# Patient Record
Sex: Female | Born: 2000
Health system: Southern US, Community
[De-identification: ages and names within clinical notes are randomized; demographics above are authoritative.]

## PROBLEM LIST (undated history)

## (undated) ENCOUNTER — Inpatient Hospital Stay (HOSPITAL_COMMUNITY): Payer: Self-pay

## (undated) VITALS — BP 90/50 | HR 103 | Temp 98.5°F | Resp 16 | Ht 61.02 in | Wt 114.6 lb

## (undated) DIAGNOSIS — F329 Major depressive disorder, single episode, unspecified: Secondary | ICD-10-CM

## (undated) DIAGNOSIS — R519 Headache, unspecified: Secondary | ICD-10-CM

## (undated) DIAGNOSIS — A6000 Herpesviral infection of urogenital system, unspecified: Secondary | ICD-10-CM

## (undated) DIAGNOSIS — F4310 Post-traumatic stress disorder, unspecified: Secondary | ICD-10-CM

## (undated) DIAGNOSIS — T7422XA Child sexual abuse, confirmed, initial encounter: Secondary | ICD-10-CM

## (undated) DIAGNOSIS — O24419 Gestational diabetes mellitus in pregnancy, unspecified control: Secondary | ICD-10-CM

## (undated) DIAGNOSIS — F32A Depression, unspecified: Secondary | ICD-10-CM

## (undated) DIAGNOSIS — F419 Anxiety disorder, unspecified: Secondary | ICD-10-CM

## (undated) DIAGNOSIS — R011 Cardiac murmur, unspecified: Secondary | ICD-10-CM

## (undated) HISTORY — DX: Cardiac murmur, unspecified: R01.1

## (undated) HISTORY — PX: NO PAST SURGERIES: SHX2092

---
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
## 2020-08-14 ENCOUNTER — Other Ambulatory Visit: Payer: Self-pay

## 2020-08-14 ENCOUNTER — Encounter (HOSPITAL_COMMUNITY): Payer: Self-pay

## 2020-08-14 ENCOUNTER — Emergency Department (HOSPITAL_COMMUNITY): Payer: Medicaid Other

## 2020-08-14 ENCOUNTER — Emergency Department (HOSPITAL_COMMUNITY)
Admission: EM | Admit: 2020-08-14 | Discharge: 2020-08-14 | Disposition: A | Discharge status: Home / Self Care | Payer: Medicaid Other | Attending: Emergency Medicine | Admitting: Emergency Medicine

## 2020-08-14 DIAGNOSIS — B9689 Other specified bacterial agents as the cause of diseases classified elsewhere: Secondary | ICD-10-CM | POA: Insufficient documentation

## 2020-08-14 DIAGNOSIS — N390 Urinary tract infection, site not specified: Secondary | ICD-10-CM | POA: Insufficient documentation

## 2020-08-14 DIAGNOSIS — R300 Dysuria: Secondary | ICD-10-CM | POA: Diagnosis present

## 2020-08-14 DIAGNOSIS — R102 Pelvic and perineal pain: Secondary | ICD-10-CM | POA: Diagnosis not present

## 2020-08-14 HISTORY — DX: Anxiety disorder, unspecified: F41.9

## 2020-08-14 LAB — POC URINE PREG, ED: Preg Test, Ur: NEGATIVE

## 2020-08-14 LAB — URINALYSIS, ROUTINE W REFLEX MICROSCOPIC
Bilirubin Urine: NEGATIVE
Glucose, UA: NEGATIVE mg/dL
Hgb urine dipstick: NEGATIVE
Ketones, ur: NEGATIVE mg/dL
Nitrite: NEGATIVE
Protein, ur: 30 mg/dL — AB
Specific Gravity, Urine: 1.033 — ABNORMAL HIGH (ref 1.005–1.030)
WBC, UA: >50 WBC/hpf — ABNORMAL HIGH (ref 0–5)
pH: 6 (ref 5.0–8.0)

## 2020-08-14 LAB — HIV ANTIBODY (ROUTINE TESTING W REFLEX): HIV Screen 4th Generation wRfx: NONREACTIVE

## 2020-08-14 LAB — WET PREP, GENITAL
Clue Cells Wet Prep HPF POC: NONE SEEN
Sperm: NONE SEEN
Trich, Wet Prep: NONE SEEN
Yeast Wet Prep HPF POC: NONE SEEN

## 2020-08-14 MED ORDER — CEPHALEXIN 500 MG PO CAPS: 500.0000 mg | ORAL_CAPSULE | Freq: Two times a day (BID) | ORAL | 0 refills | Status: DC

## 2020-08-14 MED ORDER — LIDOCAINE HCL (PF) 1 % IJ SOLN
1.0000 mL | Freq: Once | INTRAMUSCULAR | Status: AC
  Administered 2020-08-14: 1 mL
  Filled 2020-08-14: qty 30

## 2020-08-14 MED ORDER — DOXYCYCLINE HYCLATE 100 MG PO CAPS: 100.0000 mg | ORAL_CAPSULE | Freq: Two times a day (BID) | ORAL | 0 refills | Status: AC

## 2020-08-14 MED ORDER — CEFTRIAXONE SODIUM 1 G IJ SOLR
500.0000 mg | Freq: Once | INTRAMUSCULAR | Status: AC
  Administered 2020-08-14: 500 mg via INTRAMUSCULAR
  Filled 2020-08-14: qty 10

## 2020-08-14 MED ORDER — DOXYCYCLINE HYCLATE 100 MG PO CAPS: 100.0000 mg | ORAL_CAPSULE | Freq: Two times a day (BID) | ORAL | 0 refills | Status: DC

## 2020-08-14 MED ORDER — CEPHALEXIN 500 MG PO CAPS: 500.0000 mg | ORAL_CAPSULE | Freq: Two times a day (BID) | ORAL | 0 refills | Status: AC

## 2020-08-14 NOTE — ED Triage Notes (Signed)
Patient c/o right flank pain and dysuria x 1 week. Patient also thinks she might be pregnant.
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
## 2020-08-14 NOTE — Discharge Instructions (Signed)
Seen here for pelvic pain.  Lab work shows you have a UTI have started you on antibiotics please take as prescribed.  Your imaging shows you have possible ruptured ovarian cyst.  I recommend taking over-the-counter pain medications like ibuprofen or Tylenol every 6 as needed please follow dosing the back of bottle.  I also recommend staying hydrated as this will help with your UTI symptoms.  Your STD panel is pending at this time if anything is positive the hospital contact you you can also find this information on your MyChart account.  I advise abstain from sexual intercourse for 2 weeks and safe sex practices to prevent future STD scares.  I would like you to follow-up with OB/GYN for further evaluation management.  Come back to the emergency department if you develop chest pain, shortness of breath, severe abdominal pain, uncontrolled nausea, vomiting, diarrhea.

## 2020-08-15 LAB — URINE CULTURE: Culture: NO GROWTH

## 2020-08-15 LAB — GC/CHLAMYDIA PROBE AMP (~~LOC~~) NOT AT ARMC
Chlamydia: NEGATIVE
Comment: NEGATIVE
Comment: NORMAL
Neisseria Gonorrhea: POSITIVE — AB

## 2020-08-15 LAB — RPR: RPR Ser Ql: NONREACTIVE

## 2020-08-31 ENCOUNTER — Other Ambulatory Visit: Payer: Self-pay

## 2020-08-31 ENCOUNTER — Ambulatory Visit (HOSPITAL_COMMUNITY)
Admission: EM | Admit: 2020-08-31 | Discharge: 2020-08-31 | Disposition: A | Discharge status: Home / Self Care | Payer: Medicaid Other
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
## 2020-09-01 ENCOUNTER — Emergency Department (HOSPITAL_COMMUNITY)
Admission: EM | Admit: 2020-09-01 | Discharge: 2020-09-02 | Disposition: A | Discharge status: Rehabilitation Facility | Payer: Medicaid Other | Attending: Emergency Medicine | Admitting: Emergency Medicine

## 2020-09-01 ENCOUNTER — Encounter (HOSPITAL_COMMUNITY): Payer: Self-pay | Admitting: Emergency Medicine

## 2020-09-01 DIAGNOSIS — Z20822 Contact with and (suspected) exposure to covid-19: Secondary | ICD-10-CM | POA: Insufficient documentation

## 2020-09-01 DIAGNOSIS — R718 Other abnormality of red blood cells: Secondary | ICD-10-CM

## 2020-09-01 DIAGNOSIS — R45851 Suicidal ideations: Secondary | ICD-10-CM | POA: Insufficient documentation

## 2020-09-01 DIAGNOSIS — F315 Bipolar disorder, current episode depressed, severe, with psychotic features: Secondary | ICD-10-CM | POA: Insufficient documentation

## 2020-09-01 LAB — RESP PANEL BY RT-PCR (FLU A&B, COVID) ARPGX2
Influenza A by PCR: NEGATIVE
Influenza B by PCR: NEGATIVE
SARS Coronavirus 2 by RT PCR: NEGATIVE

## 2020-09-01 LAB — CBC WITH DIFFERENTIAL/PLATELET
Abs Immature Granulocytes: 0.02 10*3/uL (ref 0.00–0.07)
Basophils Absolute: 0 10*3/uL (ref 0.0–0.1)
Basophils Relative: 0 %
Eosinophils Absolute: 0.1 10*3/uL (ref 0.0–0.5)
Eosinophils Relative: 2 %
HCT: 39.2 % (ref 36.0–46.0)
Hemoglobin: 12.2 g/dL (ref 12.0–15.0)
Immature Granulocytes: 0 %
Lymphocytes Relative: 33 %
Lymphs Abs: 2.6 10*3/uL (ref 0.7–4.0)
MCH: 24 pg — ABNORMAL LOW (ref 26.0–34.0)
MCHC: 31.1 g/dL (ref 30.0–36.0)
MCV: 77.2 fL — ABNORMAL LOW (ref 80.0–100.0)
Monocytes Absolute: 0.5 10*3/uL (ref 0.1–1.0)
Monocytes Relative: 6 %
Neutro Abs: 4.5 10*3/uL (ref 1.7–7.7)
Neutrophils Relative %: 59 %
Platelets: 311 10*3/uL (ref 150–400)
RBC: 5.08 MIL/uL (ref 3.87–5.11)
RDW: 14.1 % (ref 11.5–15.5)
WBC: 7.7 10*3/uL (ref 4.0–10.5)
nRBC: 0 % (ref 0.0–0.2)

## 2020-09-01 LAB — COMPREHENSIVE METABOLIC PANEL
ALT: 26 U/L (ref 0–44)
AST: 22 U/L (ref 15–41)
Albumin: 4.3 g/dL (ref 3.5–5.0)
Alkaline Phosphatase: 54 U/L (ref 38–126)
Anion gap: 10 (ref 5–15)
BUN: 14 mg/dL (ref 6–20)
CO2: 25 mmol/L (ref 22–32)
Calcium: 9.3 mg/dL (ref 8.9–10.3)
Chloride: 102 mmol/L (ref 98–111)
Creatinine, Ser: 0.76 mg/dL (ref 0.44–1.00)
GFR, Estimated: >60 mL/min (ref >60)
Glucose, Bld: 136 mg/dL — ABNORMAL HIGH (ref 70–99)
Potassium: 3.8 mmol/L (ref 3.5–5.1)
Sodium: 137 mmol/L (ref 135–145)
Total Bilirubin: 0.5 mg/dL (ref 0.3–1.2)
Total Protein: 7 g/dL (ref 6.5–8.1)

## 2020-09-01 LAB — RAPID URINE DRUG SCREEN, HOSP PERFORMED
Amphetamines: NOT DETECTED
Barbiturates: NOT DETECTED
Benzodiazepines: NOT DETECTED
Cocaine: NOT DETECTED
Opiates: NOT DETECTED
Tetrahydrocannabinol: NOT DETECTED

## 2020-09-01 LAB — ACETAMINOPHEN LEVEL: Acetaminophen (Tylenol), Serum: <10 ug/mL — ABNORMAL LOW (ref 10–30)

## 2020-09-01 LAB — ETHANOL: Alcohol, Ethyl (B): <10 mg/dL (ref <10)

## 2020-09-01 LAB — I-STAT BETA HCG BLOOD, ED (MC, WL, AP ONLY): I-stat hCG, quantitative: <5 m[IU]/mL (ref <5)

## 2020-09-01 LAB — SALICYLATE LEVEL: Salicylate Lvl: <7 mg/dL — ABNORMAL LOW (ref 7.0–30.0)
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
## 2020-09-01 NOTE — ED Triage Notes (Signed)
Pt BIB [facility]. Pt endorsing SI, stating she wants to murder herself. Hx of anxiety, MDD, and PTSD. Non compliant with medications. Reports pt was running in and out of traffic.

## 2020-09-02 ENCOUNTER — Other Ambulatory Visit: Payer: Self-pay

## 2020-09-02 ENCOUNTER — Encounter (HOSPITAL_COMMUNITY): Payer: Self-pay

## 2020-09-02 ENCOUNTER — Inpatient Hospital Stay (HOSPITAL_COMMUNITY)
Admission: AD | Admit: 2020-09-02 | Discharge: 2020-09-05 | DRG: 885 | Disposition: A | Discharge status: Home / Self Care | Payer: Medicaid Other | Attending: Psychiatry | Admitting: Psychiatry

## 2020-09-02 DIAGNOSIS — F315 Bipolar disorder, current episode depressed, severe, with psychotic features: Secondary | ICD-10-CM | POA: Diagnosis not present

## 2020-09-02 DIAGNOSIS — Z20822 Contact with and (suspected) exposure to covid-19: Secondary | ICD-10-CM | POA: Diagnosis present

## 2020-09-02 DIAGNOSIS — R45851 Suicidal ideations: Secondary | ICD-10-CM | POA: Diagnosis present

## 2020-09-02 DIAGNOSIS — F319 Bipolar disorder, unspecified: Principal | ICD-10-CM | POA: Insufficient documentation

## 2020-09-02 DIAGNOSIS — Z9151 Personal history of suicidal behavior: Secondary | ICD-10-CM

## 2020-09-02 DIAGNOSIS — Z888 Allergy status to other drugs, medicaments and biological substances status: Secondary | ICD-10-CM

## 2020-09-02 DIAGNOSIS — Z634 Disappearance and death of family member: Secondary | ICD-10-CM | POA: Diagnosis not present

## 2020-09-02 DIAGNOSIS — F329 Major depressive disorder, single episode, unspecified: Secondary | ICD-10-CM | POA: Diagnosis present

## 2020-09-02 DIAGNOSIS — Z62810 Personal history of physical and sexual abuse in childhood: Secondary | ICD-10-CM | POA: Diagnosis present

## 2020-09-02 DIAGNOSIS — Z79899 Other long term (current) drug therapy: Secondary | ICD-10-CM

## 2020-09-02 DIAGNOSIS — Z818 Family history of other mental and behavioral disorders: Secondary | ICD-10-CM

## 2020-09-02 DIAGNOSIS — F4310 Post-traumatic stress disorder, unspecified: Secondary | ICD-10-CM | POA: Diagnosis present

## 2020-09-02 DIAGNOSIS — Z79891 Long term (current) use of opiate analgesic: Secondary | ICD-10-CM | POA: Diagnosis not present

## 2020-09-02 DIAGNOSIS — R630 Anorexia: Secondary | ICD-10-CM | POA: Diagnosis present

## 2020-09-02 DIAGNOSIS — F419 Anxiety disorder, unspecified: Secondary | ICD-10-CM | POA: Diagnosis present

## 2020-09-02 LAB — URINALYSIS, ROUTINE W REFLEX MICROSCOPIC
Bilirubin Urine: NEGATIVE
Glucose, UA: NEGATIVE mg/dL
Hgb urine dipstick: NEGATIVE
Ketones, ur: NEGATIVE mg/dL
Leukocytes,Ua: NEGATIVE
Nitrite: NEGATIVE
Protein, ur: NEGATIVE mg/dL
Specific Gravity, Urine: 1.027 (ref 1.005–1.030)
pH: 7 (ref 5.0–8.0)

## 2020-09-02 MED ORDER — HYDROXYZINE HCL 25 MG PO TABS
25.0000 mg | ORAL_TABLET | Freq: Three times a day (TID) | ORAL | Status: DC | PRN
  Administered 2020-09-03 – 2020-09-05 (×2): 25 mg via ORAL
  Filled 2020-09-02 (×2): qty 1

## 2020-09-02 MED ORDER — ALUM & MAG HYDROXIDE-SIMETH 200-200-20 MG/5ML PO SUSP: 30.0000 mL | ORAL | Status: DC | PRN

## 2020-09-02 MED ORDER — MAGNESIUM HYDROXIDE 400 MG/5ML PO SUSP
30.0000 mL | Freq: Every day | ORAL | Status: DC | PRN
  Administered 2020-09-04: 30 mL via ORAL
  Filled 2020-09-02: qty 30

## 2020-09-02 MED ORDER — SODIUM CHLORIDE 0.9 % IV BOLUS
1000.0000 mL | Freq: Once | INTRAVENOUS | Status: AC
  Administered 2020-09-02: 1000 mL via INTRAVENOUS

## 2020-09-02 MED ORDER — TRAZODONE HCL 50 MG PO TABS: 50.0000 mg | ORAL_TABLET | Freq: Every evening | ORAL | Status: DC | PRN

## 2020-09-02 MED ORDER — ACETAMINOPHEN 325 MG PO TABS
650.0000 mg | ORAL_TABLET | Freq: Four times a day (QID) | ORAL | Status: DC | PRN
  Administered 2020-09-03 – 2020-09-05 (×2): 650 mg via ORAL
  Filled 2020-09-02 (×2): qty 2

## 2020-09-02 MED ORDER — ARIPIPRAZOLE 10 MG PO TABS
20.0000 mg | ORAL_TABLET | Freq: Every day | ORAL | Status: DC
  Administered 2020-09-03: 20 mg via ORAL
  Filled 2020-09-02 (×4): qty 2

## 2020-09-02 NOTE — Progress Notes (Signed)
Per Ophelia Shoulder NP, pt continues to meet inpatient criteria. Pt is in review at [facility], however there are no beds available today.  TTS continuing to assess for placement.   Korra Christine S. Alan Ripper, MSW, LCSW Clinical Social Worker 09/02/2020 2:15 PM

## 2020-09-02 NOTE — ED Notes (Signed)
Report c 
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
## 2020-09-02 NOTE — Progress Notes (Signed)
Patient is a 19 year old female who presents under IVC due to 'running in and out of traffic'.Pt was petitioned by her mother who reported that pt was suicidal. Pt denies SI, but reports that she has been 'unstable' since running out of her meds. Pt reports that she hasn't been eating/ drinking, endorses crying spells and PTSD . Pt presents as anxious and somewhat hyper-verbal, answered questions logically and coherently throughout admission interview. Pt currently denies SI/HI and A/VH. Admission paperwork completed and signed. Verbal understanding expressed. VS obtained.  Skin check performed with MHTand revealed no abnormalities. Contraband was not found. Patient was oriented to unit and schedule. Dinner and PO fluids provided.

## 2020-09-02 NOTE — ED Notes (Signed)
[facility] notified for transport.

## 2020-09-02 NOTE — BH Assessment (Signed)
Attempted to assess patient, but she is currently sleeping.

## 2020-09-02 NOTE — ED Notes (Signed)
Pt.s blood pressure low. RN notified.

## 2020-09-02 NOTE — ED Notes (Signed)
Pt walked to the bathroom. Pt cooperative and calm during the entire time. Pt sitting up on stretcher and eating breakfast.
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
## 2020-09-02 NOTE — ED Notes (Signed)
Pt awoken from nap, asked to use the restroom. Pt walked, cooperative throughout the entire time. This Clinical research associate asked pt if she wanted a snack, pt stated yes. [place] sandwich given topt.

## 2020-09-02 NOTE — Progress Notes (Signed)
Per Armen Pickup. Hart Rochester RN, [facility], pt has been accepted to [facility] 404/01 for today. Ophelia Shoulder NP has been notified.   Tyger Oka S. Alan Ripper, MSW, LCSW Clinical Social Worker 09/02/2020 3:56 PM

## 2020-09-02 NOTE — ED Notes (Signed)
Pt belongings moved back to the cabinet in the 23-25 nurses station.

## 2020-09-02 NOTE — ED Notes (Signed)
Report called to [facility] RN Alla Feeling

## 2020-09-03 DIAGNOSIS — Z634 Disappearance and death of family member: Secondary | ICD-10-CM

## 2020-09-03 DIAGNOSIS — F4310 Post-traumatic stress disorder, unspecified: Secondary | ICD-10-CM

## 2020-09-03 LAB — LIPID PANEL
Cholesterol: 156 mg/dL (ref 0–200)
HDL: 53 mg/dL (ref >40)
LDL Cholesterol: 73 mg/dL (ref 0–99)
Total CHOL/HDL Ratio: 2.9 RATIO
Triglycerides: 150 mg/dL — ABNORMAL HIGH (ref <150)
VLDL: 30 mg/dL (ref 0–40)

## 2020-09-03 LAB — HEMOGLOBIN A1C
Hgb A1c MFr Bld: 5.4 % (ref 4.8–5.6)
Mean Plasma Glucose: 108.28 mg/dL

## 2020-09-03 MED ORDER — FOLIC ACID 1 MG PO TABS
1.0000 mg | ORAL_TABLET | Freq: Every day | ORAL | Status: DC
  Administered 2020-09-03 – 2020-09-05 (×3): 1 mg via ORAL
  Filled 2020-09-03 (×5): qty 1

## 2020-09-03 MED ORDER — ONDANSETRON HCL 4 MG PO TABS
4.0000 mg | ORAL_TABLET | Freq: Four times a day (QID) | ORAL | Status: DC
  Administered 2020-09-03 – 2020-09-05 (×7): 4 mg via ORAL
  Filled 2020-09-03 (×16): qty 1

## 2020-09-03 MED ORDER — ARIPIPRAZOLE 2 MG PO TABS
2.0000 mg | ORAL_TABLET | Freq: Every day | ORAL | Status: DC
  Administered 2020-09-04: 2 mg via ORAL
  Filled 2020-09-03 (×2): qty 1

## 2020-09-03 MED ORDER — TRAZODONE HCL 50 MG PO TABS
50.0000 mg | ORAL_TABLET | Freq: Every day | ORAL | Status: DC
  Administered 2020-09-03: 50 mg via ORAL
  Filled 2020-09-03 (×5): qty 1

## 2020-09-03 MED ORDER — THIAMINE HCL 100 MG PO TABS
100.0000 mg | ORAL_TABLET | Freq: Every day | ORAL | Status: DC
  Administered 2020-09-03 – 2020-09-05 (×3): 100 mg via ORAL
  Filled 2020-09-03 (×5): qty 1
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
## 2020-09-03 NOTE — Progress Notes (Signed)
Patient stated she has never smoked tobacco, chewed or vaped tobacco.   No nicotine patch or nicotine gum needed.

## 2020-09-03 NOTE — Progress Notes (Signed)
Pt has been a sleep since the beginning of the shift, no interaction with the writer, respiration has been even and unlabored, will continue to monitor.

## 2020-09-03 NOTE — [facility] Group Notes (Signed)
B[facility] LCSW Group Therapy Note  09/03/2020    Type of Therapy and Topic:  Group Therapy:  Adding Supports Including Yourself  Participation Level:  Active   Description of Group:   Patients in this group were introduced to the concept that additional supports including self-support are an essential part of recovery.  Patients listed what supports they believe they need to add to their lives to achieve their goals at discharge, and they listed such things as therapist, family, doctor, support groups, 12-step groups and service animals.   A song entitled "My Own Hero" was played and a group discussion ensued in which patients stated they could relate to the song and it inspired them to realize they have be willing to help themselves in order to succeed, because other people cannot achieve sobriety or stability for them.  "Fight For It" was played, then "I Am Enough" to encourage patients.  They discussed the impact on them and how they must remain convinced that their lives are worth the effort it takes to become sober and/or stable.  Therapeutic Goals: 1)  demonstrate the importance of being a key part of one's own support system 2)  discuss various available supports 3)  encourage patient to use music as part of their self-support and focus on goals 4)  elicit ideas from patients about supports that need to be added   Summary of Patient Progress:  The patient expressed that her mother is trying to be a positive support, and her father and oldest brother are unhealthy supports for her.  She stated her best friend has "gone to heaven" by committing suicide in front of the patient, but she feels like her best friend is still watching out for her.  She believes that she needs to start relying on her faith again.  She also stated that her peers and professionals in the hospital are proving to be very supportive, which has surprised her because she did not expect it.  She was out for part of group seeing the  doctor, but then returned.   Therapeutic Modalities:   Motivational Interviewing Activity  Carloyn Jaeger Grossman-Orr  12:48 PM

## 2020-09-03 NOTE — Progress Notes (Signed)
D:  Patient denied SI and HI, contracts for safety.  Denied A/V hallucinations.   A:  Medications administered per MD orders.  Emotional support and encouragement given patient. R:  Safety maintained with 15 minute checks.  

## 2020-09-03 NOTE — B[facility] Group Notes (Signed)
The focus of this group is to help patients establish daily goals to achieve during treatment and discuss how the patient can incorporate goal setting into their daily lives to aide in recovery.   Patient attended group and contributed to group. 
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
## 2020-09-03 NOTE — Plan of Care (Signed)
Nurse discussed anxiety, depression and coping skills with patient.  

## 2020-09-03 NOTE — BHH Group Notes (Signed)
Adult Psychoeducational Group Not Date:  09/03/2020 Time:  0900-1045 Group Topic/Focus: PROGRESSIVE RELAXATION. A group where deep breathing is taught and tensing and relaxation muscle groups is used. Imagery is used as well.  Pts are asked to imagine 3 pillars that hold them up when they are not able to hold themselves up.  Participation Level:  Did not attend   Shalece Staffa A 09/03/2020`  

## 2020-09-04 DIAGNOSIS — F4310 Post-traumatic stress disorder, unspecified: Secondary | ICD-10-CM | POA: Diagnosis not present

## 2020-09-04 MED ORDER — DOCUSATE SODIUM 100 MG PO CAPS
100.0000 mg | ORAL_CAPSULE | Freq: Two times a day (BID) | ORAL | Status: DC
  Administered 2020-09-04 – 2020-09-05 (×2): 100 mg via ORAL
  Filled 2020-09-04 (×6): qty 1

## 2020-09-04 MED ORDER — CITALOPRAM HYDROBROMIDE 20 MG PO TABS
20.0000 mg | ORAL_TABLET | Freq: Every day | ORAL | Status: DC
  Administered 2020-09-04 – 2020-09-05 (×2): 20 mg via ORAL
  Filled 2020-09-04 (×5): qty 1

## 2020-09-04 MED ORDER — ARIPIPRAZOLE 5 MG PO TABS
5.0000 mg | ORAL_TABLET | Freq: Every day | ORAL | Status: DC
  Administered 2020-09-05: 5 mg via ORAL
  Filled 2020-09-04 (×3): qty 1

## 2020-09-04 MED ORDER — ARIPIPRAZOLE 2 MG PO TABS
3.0000 mg | ORAL_TABLET | Freq: Once | ORAL | Status: AC
  Administered 2020-09-04: 3 mg via ORAL
  Filled 2020-09-04: qty 2

## 2020-09-04 NOTE — Tx Team (Signed)
Interdisciplinary Treatment and Diagnostic Plan Update  09/04/2020 Time of Session: 9:45am  Janet Horn MRN: [id number]  Principal Diagnosis: PTSD (post-traumatic stress disorder)  Secondary Diagnoses: Principal Problem:   PTSD (post-traumatic stress disorder) Active Problems:   Decreased appetite   Bereavement   Current Medications:  Current Facility-Administered Medications  Medication Dose Route Frequency Provider Last Rate Last Admin  . acetaminophen (TYLENOL) tablet 650 mg  650 mg Oral Q6H PRN Patrcia Dolly, FNP   650 mg at 09/03/20 1141  . alum & mag hydroxide-simeth (MAALOX/MYLANTA) 200-200-20 MG/5ML suspension 30 mL  30 mL Oral Q4H PRN Patrcia Dolly, FNP      . ARIPiprazole (ABILIFY) tablet 3 mg  3 mg Oral Once Bobbye Morton, MD      . Melene Muller ON 09/05/2020] ARIPiprazole (ABILIFY) tablet 5 mg  5 mg Oral Daily McQuilla, Jai B, MD      . citalopram (CELEXA) tablet 20 mg  20 mg Oral Daily Eliseo Gum B, MD      . folic acid (FOLVITE) tablet 1 mg  1 mg Oral Daily Eliseo Gum B, MD   1 mg at 09/04/20 0755  . hydrOXYzine (ATARAX/VISTARIL) tablet 25 mg  25 mg Oral TID PRN Patrcia Dolly, FNP   25 mg at 09/03/20 1252  . magnesium hydroxide (MILK OF MAGNESIA) suspension 30 mL  30 mL Oral Daily PRN Patrcia Dolly, FNP      . ondansetron [facility]) tablet 4 mg  4 mg Oral Q6H Eliseo Gum B, MD   4 mg at 09/03/20 2123  . thiamine tablet 100 mg  100 mg Oral Daily Eliseo Gum B, MD   100 mg at 09/04/20 0755  . traZODone (DESYREL) tablet 50 mg  50 mg Oral QHS Eliseo Gum B, MD   50 mg at 09/03/20 2123   PTA Medications: Medications Prior to Admission  Medication Sig Dispense Refill Last Dose  . ARIPiprazole (ABILIFY) 5 MG tablet Take 1 tablet (5 mg total) by mouth daily. 30 tablet 0   . buPROPion HCl (WELLBUTRIN PO) Take 1 tablet by mouth daily after breakfast. Patient not sure of the dose. Was given to her by MD in [place]     . citalopram (CELEXA) 20 MG tablet Take 20 mg  by mouth daily.     . cyproheptadine (PERIACTIN) 4 MG tablet Take 1 tablet (4 mg total) by mouth 2 (two) times daily as needed for allergies. (Patient not taking: Reported on 09/02/2020) 30 tablet 0   . hydrOXYzine (ATARAX/VISTARIL) 25 MG tablet Take 1 tablet (25 mg total) by mouth at bedtime. 30 tablet 0   . sertraline (ZOLOFT) 50 MG tablet Take 1.5 tablets (75 mg total) by mouth daily. (Patient not taking: Reported on 09/02/2020) 45 tablet 0   . traZODone (DESYREL) 50 MG tablet Take 50 mg by mouth at bedtime.        Patient Stressors: Traumatic event Other: off medications  Patient Strengths: Average or above average intelligence Capable of independent living Communication skills Motivation for treatment/growth Physical Health Supportive family/friends Work skills  Treatment Modalities: Medication Management, Group therapy, Case management,  1 to 1 session with clinician, Psychoeducation, Recreational therapy.   Physician Treatment Plan for Primary Diagnosis: PTSD (post-traumatic stress disorder) Long Term Goal(s): Improvement in symptoms so as ready for discharge Improvement in symptoms so as ready for discharge   Short Term Goals: Ability to verbalize feelings will improve Ability to disclose and discuss suicidal ideas Compliance with prescribed  medications will improve Ability to identify and develop effective coping behaviors will improve Compliance with prescribed medications will improve Ability to identify triggers associated with substance abuse/mental health issues will improve  Medication Management: Evaluate patient's response, side effects, and tolerance of medication regimen.  Therapeutic Interventions: 1 to 1 sessions, Unit Group sessions and Medication administration.  Evaluation of Outcomes: Progressing  Physician Treatment Plan for Secondary Diagnosis: Principal Problem:   PTSD (post-traumatic stress disorder) Active Problems:   Decreased appetite    Bereavement  Long Term Goal(s): Improvement in symptoms so as ready for discharge Improvement in symptoms so as ready for discharge   Short Term Goals: Ability to verbalize feelings will improve Ability to disclose and discuss suicidal ideas Compliance with prescribed medications will improve Ability to identify and develop effective coping behaviors will improve Compliance with prescribed medications will improve Ability to identify triggers associated with substance abuse/mental health issues will improve     Medication Management: Evaluate patient's response, side effects, and tolerance of medication regimen.  Therapeutic Interventions: 1 to 1 sessions, Unit Group sessions and Medication administration.  Evaluation of Outcomes: Progressing   RN Treatment Plan for Primary Diagnosis: PTSD (post-traumatic stress disorder) Long Term Goal(s): Knowledge of disease and therapeutic regimen to maintain health will improve  Short Term Goals: Ability to remain free from injury will improve, Ability to demonstrate self-control, Ability to participate in decision making will improve, Ability to disclose and discuss suicidal ideas and Ability to identify and develop effective coping behaviors will improve  Medication Management: RN will administer medications as ordered by provider, will assess and evaluate patient's response and provide education to patient for prescribed medication. RN will report any adverse and/or side effects to prescribing provider.  Therapeutic Interventions: 1 on 1 counseling sessions, Psychoeducation, Medication administration, Evaluate responses to treatment, Monitor vital signs and CBGs as ordered, Perform/monitor CIWA, COWS, AIMS and Fall Risk screenings as ordered, Perform wound care treatments as ordered.  Evaluation of Outcomes: Progressing   LCSW Treatment Plan for Primary Diagnosis: PTSD (post-traumatic stress disorder) Long Term Goal(s): Safe transition to  appropriate next level of care at discharge, Engage patient in therapeutic group addressing interpersonal concerns.  Short Term Goals: Engage patient in aftercare planning with referrals and resources, Increase social support, Increase ability to appropriately verbalize feelings, Facilitate acceptance of mental health diagnosis and concerns and Identify triggers associated with mental health/substance abuse issues  Therapeutic Interventions: Assess for all discharge needs, 1 to 1 time with Social worker, Explore available resources and support systems, Assess for adequacy in community support network, Educate family and significant other(s) on suicide prevention, Complete Psychosocial Assessment, Interpersonal group therapy.  Evaluation of Outcomes: Progressing   Progress in Treatment: Attending groups: Yes. Participating in groups: Yes. Taking medication as prescribed: Yes. Toleration medication: Yes. Family/Significant other contact made: Yes, individual(s) contacted:  Mother Patient understands diagnosis: Yes. Discussing patient identified problems/goals with staff: Yes. Medical problems stabilized or resolved: Yes. Denies suicidal/homicidal ideation: Yes. Issues/concerns per patient self-inventory: No.   New problem(s) identified: No, Describe:  None  New Short Term/Long Term Goal(s): medication stabilization, elimination of SI thoughts, development of comprehensive mental wellness plan.   Patient Goals:  "To get my medications right and to get some rest"  Discharge Plan or Barriers: Patient recently admitted. CSW will continue to follow and assess for appropriate referrals and possible discharge planning.   Reason for Continuation of Hospitalization: Anxiety Depression Mania Medication stabilization Suicidal ideation  Estimated Length of Stay: 3 to  5 days  Attendees: Patient: Janet Horn  09/04/2020   Physician: Pricilla Larsson, MD 09/04/2020   Nursing:   09/04/2020   RN Care Manager: 09/04/2020   Social Worker: Melba Coon, LCSWA 09/04/2020   Recreational Therapist:  09/04/2020   Other:  09/04/2020   Other:  09/04/2020   Other: 09/04/2020     Scribe for Treatment Team: Aram Beecham, LCSWA 09/04/2020 11:20 AM

## 2020-09-04 NOTE — Progress Notes (Signed)
Recreation Therapy Notes ° °Date:  1.22.20 °Time: 0930 °Location: [address] Dayroom ° °Group Topic: Stress Management ° °Goal Area(s) Addresses:  °Patient will identify positive stress management techniques. °Patient will identify benefits of using stress management post d/c. ° °Behavioral Response: Engaged ° °Intervention: Stress Management ° °Activity :  Meditation.  LRT played a meditation that guided patients through a body scan to take inventory of any sensations they may have been feeling at the time.  Patients were to listen and follow along as meditation played to fully engage. ° °Education:  Stress Management, Discharge Planning.  ° °Education Outcome: Acknowledges Education ° °Clinical Observations/Feedback: Pt attended and participated in group session. ° ° ° ° ° °Waylen Depaolo, LRT/CTRS ° ° ° ° ° ° ° ° °Shellie Goettl A °09/04/2020 12:19 PM °

## 2020-09-04 NOTE — [facility] Group Notes (Signed)
[facility] LCSW Group Therapy  09/04/2020 2:33 PM  Type of Therapy:  Why Try Therapy  Participation Level:  Active  Participation Quality:  Appropriate, Sharing and Supportive  Affect:  Anxious  Cognitive:  Appropriate  Insight:  Developing/Improving  Engagement in Therapy:  Developing/Improving  Modes of Intervention:  Activity and Discussion  Summary of Progress/Problems: Janet Horn took notes during the group and shared with her peers.  Janet Horn stated that the video inspired her to go back to therapy and that she wants to do this for herself because she states that she cannot help anyone else until she helps her self.  Janet Horn states that she wants to create a plan for her mental health and therapy.  Aram Beecham 09/04/2020, 2:33 PM

## 2020-09-04 NOTE — Progress Notes (Signed)
   09/04/20 1600  Psych Admission Type (Psych Patients Only)  Admission Status Involuntary  Psychosocial Assessment  Patient Complaints None  Eye Contact Brief  Facial Expression Anxious  Affect Anxious;Depressed;Sad  Speech Incoherent  Interaction Assertive  Motor Activity Other (Comment) (appropriate)  Appearance/Hygiene Unremarkable;Other (Comment)  Behavior Characteristics Cooperative  Mood Pleasant  Thought Process  Coherency WDL  Content WDL  Delusions None reported or observed  Perception WDL  Hallucination None reported or observed  Judgment WDL  Confusion None  Danger to Self  Current suicidal ideation? Denies  Danger to Others  Danger to Others None reported or observed  Dar Note: Patient presents with calm affect and pleasant mood.  Denies suicidal thoughts, auditory and  Visual hallucinations.  Patient observed interacting with peers in the dayroom.  Patient is safe on and off the unit.
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
## 2020-09-04 NOTE — Progress Notes (Signed)
   09/03/20 2320  Psych Admission Type (Psych Patients Only)  Admission Status Involuntary  Psychosocial Assessment  Patient Complaints None  Eye Contact Brief  Facial Expression Anxious  Affect Anxious;Depressed;Sad  Speech Incoherent  Interaction Assertive  Motor Activity Other (Comment) (appropriate)  Appearance/Hygiene Unremarkable;Other (Comment)  Behavior Characteristics Cooperative;Calm  Mood Pleasant  Thought Process  Coherency WDL  Content WDL  Delusions None reported or observed  Perception WDL  Hallucination None reported or observed  Judgment WDL  Confusion None  Danger to Self  Current suicidal ideation? Denies  Danger to Others  Danger to Others None reported or observed

## 2020-09-04 NOTE — Progress Notes (Signed)
Adult Psychoeducational Group Note  Date:  09/04/2020 Time:  6:50 PM  Group Topic/Focus:  Developing a Wellness Toolbox:   The focus of this group is to help patients develop a "wellness toolbox" with skills and strategies to promote recovery upon discharge.  Participation Level:  Active  Participation Quality:  Appropriate  Affect:  Appropriate  Cognitive:  Appropriate  Insight: Improving  Engagement in Group:  Engaged  Modes of Intervention:  Discussion and Education  Additional Comments: Pt attended group and shared appropriately.  Augusto Deckman E 09/04/2020, 6:50 PM

## 2020-09-04 NOTE — Progress Notes (Signed)
Pt visible on the unit interacting with peers, pt stated she was doing ok, getting her medications right     09/04/20 2000  Psych Admission Type (Psych Patients Only)  Admission Status Involuntary  Psychosocial Assessment  Patient Complaints None  Eye Contact Brief  Facial Expression Anxious  Affect Anxious;Depressed;Sad  Speech Incoherent  Interaction Assertive  Motor Activity Other (Comment) (appropriate)  Appearance/Hygiene Unremarkable;Other (Comment)  Behavior Characteristics Cooperative  Mood Pleasant  Thought Process  Coherency WDL  Content WDL  Delusions None reported or observed  Perception WDL  Hallucination None reported or observed  Judgment WDL  Confusion None  Danger to Self  Current suicidal ideation? Denies  Danger to Others  Danger to Others None reported or observed

## 2020-09-05 MED ORDER — ONDANSETRON HCL 4 MG PO TABS: 4.0000 mg | ORAL_TABLET | Freq: Four times a day (QID) | ORAL | 0 refills | Status: DC | PRN

## 2020-09-05 MED ORDER — ARIPIPRAZOLE 5 MG PO TABS: 5.0000 mg | ORAL_TABLET | Freq: Every day | ORAL | 0 refills | Status: DC

## 2020-09-05 MED ORDER — CITALOPRAM HYDROBROMIDE 20 MG PO TABS: 20.0000 mg | ORAL_TABLET | Freq: Every day | ORAL | 0 refills | Status: DC

## 2020-09-05 NOTE — Progress Notes (Signed)
Discharge Note:  Pt discharged at 1600; left with her older brother, her ride to go home. Upon discharge, pt is alert and oriented to person, place, time and situation. Pt denies suicidal and homicidal ideation, denies hallucinations, denies feelings of depression and anxiety. Pt is calm, cooperative with the discharge process, given discharge instructions, which include her discharge follow up appointments, discharge prescriptions sent to her pharmacy electronically, given discharge medication education; pt verbalized understanding of all. All personal belongings given to pt upon discharge. No distress noted, none voices, pt voices no complaints. Pt reports her mood is "very good." Pt's affect is bright, smiles on contact, reports she looks forward to eating various foods her family is preparing for the holidays.

## 2020-09-05 NOTE — Progress Notes (Signed)
  [facility] Adult Case Management Discharge Plan :  Will you be returning to the same living situation after discharge:  Yes,  Home with parents  At discharge, do you have transportation home?: Yes,  Mother Do you have the ability to pay for your medications: Yes,  Medicaid   Release of information consent forms completed and in the chart;  Patient's signature needed at discharge.  Patient to Follow up at:  Follow-up Information    [facility]. Go on 09/06/2020.   Specialty: Behavioral Health Why: You have a walk in appointment on 09/06/20 at 7:45 am for medication management services (walk in appointments are first come,first served).  This appointment will be held in person.   Contact information: [address] [place] [address] [phone]       [facility] Follow up.   Specialty: Professional Counselor Why: Appointment on 09/12/20 at 9:00am for therapy.  Contact information: [facility] [place] [address] [place] [address] [phone]               Next level of care provider has access to [facility] Link:yes  Safety Planning and Suicide Prevention discussed: Yes,  Mother and patient  Have you used any form of tobacco in the last 30 days? (Cigarettes, Smokeless Tobacco, Cigars, and/or Pipes): No  Has patient been referred to the Quitline?: N/A patient is not a smoker  Patient has been referred for addiction treatment: N/A  Aram Beecham, LCSWA 09/05/2020, 10:34 AM

## 2020-09-05 NOTE — Progress Notes (Deleted)
  [facility] Adult Case Management Discharge Plan :  Will you be returning to the same living situation after discharge:  Yes,  Home with parents At discharge, do you have transportation home?: Yes,  Mother Do you have the ability to pay for your medications: Yes,  Medicaid   Release of information consent forms completed and in the chart;  Patient's signature needed at discharge.  Patient to Follow up at:  Follow-up Information    [facility]. Go on 09/06/2020.   Specialty: Behavioral Health Why: You have a walk in appointment on 09/06/20 at 7:45 am for medication management services (walk in appointments are first come,first served).  This appointment will be held in person.   Contact information: [address] [place] [address] [phone]       [facility]renest Rasher Follow up.   Why: Appointment scheduled for 09/12/20 at 9:00am for therapy.  Contact information: Address: [address], [place], [place] [address]  Phone: [phone]              Next level of care provider has access to [facility] Link:yes  Safety Planning and Suicide Prevention discussed: Yes,  Mother and patient   Have you used any form of tobacco in the last 30 days? (Cigarettes, Smokeless Tobacco, Cigars, and/or Pipes): No  Has patient been referred to the Quitline?: N/A patient is not a smoker  Patient has been referred for addiction treatment: N/A  Aram Beecham, LCSWA 09/05/2020, 10:14 AM
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
## 2020-09-05 NOTE — BHH Suicide Risk Assessment (Signed)
[facility] Discharge Suicide Risk Assessment   Principal Problem: PTSD (post-traumatic stress disorder) Discharge Diagnoses: Principal Problem:   PTSD (post-traumatic stress disorder) Active Problems:   Decreased appetite   Bereavement 3  Total Time spent with patient: 20 minutes  Musculoskeletal: Strength & Muscle Tone: within normal limits Gait & Station: normal Patient leans: N/A  Psychiatric Specialty Exam: Review of Systems  Blood pressure 105/66, pulse 79, temperature 98.3 F (36.8 C), temperature source Oral, resp. rate 16, height 5\' 2"  (1.575 m), weight 73.5 kg, SpO2 100 %.Body mass index is 29.63 kg/m.  General Appearance: Casual  Eye Contact::  Fair  Speech:  Clear and Coherent409  Volume:  Normal  Mood:  Euthymic  Affect:  Appropriate  Thought Process:  Coherent  Orientation:  Full (Time, Place, and Person)  Thought Content:  Logical  Suicidal Thoughts:  No  Homicidal Thoughts:  No  Memory:  Recent;   Fair  Judgement:  Fair  Insight:  Fair  Psychomotor Activity:  Normal  Concentration:  Fair  Recall:  [id number] of Knowledge:Fair  Language: Fair  Akathisia:  No  Handed:  Right  AIMS (if indicated):     Assets:  Communication Skills Desire for Improvement Physical Health Resilience  Sleep:  Number of Hours: 6.75  Cognition: WNL  ADL's:  Intact   Mental Status Per Nursing Assessment::   On Admission:  Suicidal ideation indicated by others  Demographic Factors:  Adolescent or young adult  Loss Factors: NA  Historical Factors: Impulsivity  Risk Reduction Factors:   Sense of responsibility to family, Religious beliefs about death, Living with another person, especially a relative, Positive social support and Positive therapeutic relationship  Continued Clinical Symptoms:  Previous Psychiatric Diagnoses and Treatments  Cognitive Features That Contribute To Risk:  None    Suicide Risk:  Minimal: No identifiable suicidal ideation.  Patients  presenting with no risk factors but with morbid ruminations; may be classified as minimal risk based on the severity of the depressive symptoms   Follow-up Information    [facility]. Go on 09/06/2020.   Specialty: Behavioral Health Why: You have a walk in appointment on 09/06/20 at 7:45 am for medication management services (walk in appointments are first come,first served).  This appointment will be held in person.   Contact information: [address] [place] [phone]       [facility] Follow up.   Specialty: Professional Counselor Why: Appointment on 09/12/20 at 9:00am for therapy.  Contact information: [facility] 09/14/20 [address] [place] [phone]               Plan Of Care/Follow-up recommendations:  Other:  Follow-up with outpatient care  [phone], MD 09/05/2020, 10:37 AM

## 2020-09-19 ENCOUNTER — Ambulatory Visit (HOSPITAL_COMMUNITY)
Admission: EM | Admit: 2020-09-19 | Discharge: 2020-09-19 | Disposition: A | Discharge status: Home / Self Care | Payer: Medicaid Other | Attending: Nurse Practitioner | Admitting: Nurse Practitioner

## 2020-09-19 ENCOUNTER — Other Ambulatory Visit: Payer: Self-pay

## 2020-09-19 DIAGNOSIS — F329 Major depressive disorder, single episode, unspecified: Secondary | ICD-10-CM | POA: Insufficient documentation

## 2020-09-19 DIAGNOSIS — F419 Anxiety disorder, unspecified: Secondary | ICD-10-CM | POA: Insufficient documentation

## 2020-09-19 DIAGNOSIS — R450 Nervousness: Secondary | ICD-10-CM | POA: Insufficient documentation

## 2020-09-19 DIAGNOSIS — R45851 Suicidal ideations: Secondary | ICD-10-CM | POA: Insufficient documentation

## 2020-09-19 DIAGNOSIS — F4310 Post-traumatic stress disorder, unspecified: Secondary | ICD-10-CM | POA: Insufficient documentation
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
## 2020-09-19 NOTE — ED Notes (Signed)
Patient belongings are stored in locker 25 

## 2020-09-19 NOTE — ED Triage Notes (Signed)
Patient states "I was depressed and felt an urge to cut but I told mama to call the police and I came voluntary." Patient denies HI and A/V/H. Patient has an history of cutting. Stressors work, friend committed suicide in 06-05-23, and grandma died in 02/02/17 and it will be third year she has been gone."

## 2020-09-19 NOTE — Discharge Instructions (Addendum)

## 2020-09-19 NOTE — BH Assessment (Signed)
Comprehensive Clinical Assessment (CCA) Screening, Triage and Referral Note  09/19/2020 Janet Horn [id number]  Chief Complaint:  Chief Complaint  Patient presents with  . Suicidal   Visit Diagnosis: F33.0, Major depressive disorder, Recurrent episode, Mild; F41.1, Generalized anxiety disorder   Janet Horn is a 19 year old patient who was brought to the [facility] via the police department after pt's mother called the police department b/c pt was experiencing a panic attack. Pt shares that, during the panic attack, she experienced SI and had a desire to engage in NSSIB via cutting, though she states she did not; pt state she has not engaged in self-harm behaviors since she was 19 years old.  Pt denies current SI; she denies currently having a plan to kill herself. Pt denies HI, AVH, access to guns/weapons, engagement with the legal system, or the use of substances.  Pt's protective factors include no HI, AVH, and pt's awareness into her mental health.  Pt declined to provide verbal consent for clinician to make contact with her mother/others for collateral information.  Pt is oriented x5. Her recent/remote memory is intact. Pt was cooperative throughout the assessment process. Pt's insight, judgement, and impulse control is fair - good at this time.   Options For Referral: Outpatient Therapy;Medication Management Lindon Romp, NP, reviewed pt's chart and information and met with pt and determined pt can be psych cleared and can be transported home by the police department. Pt noted a plan for her to seek a psychiatry appointment through the [facility] on Wednesday; it was advised that pt follow-through with this plan.   Patient Reported Information How did you hear about [place]? Legal System (Phreesia 09/19/2020)   Referral name: Police Royden Purl [id number])   Referral phone number: No data recorded Whom do you see for routine medical  problems? I don't have a doctor ([place] 09/19/2020)   Practice/Facility Name: No data recorded  Practice/Facility Phone Number: No data recorded  Name of Contact: No data recorded  Contact Number: No data recorded  Contact Fax Number: No data recorded  Prescriber Name: No data recorded  Prescriber Address (if known): No data recorded What Is the Reason for Your Visit/Call Today? [place]lounatry Hold Wanting To Commit Suicdie (Phreesia 09/19/2020)  How Long Has This Been Causing You Problems? <Week (Phreesia 09/19/2020)  Have You Recently Been in Any Inpatient Treatment (Hospital/Detox/Crisis Center/28-Day Program)? Yes (Phreesia 09/19/2020)   Name/Location of Program/Hospital:[facility] (Phreesia 09/19/2020)   How Long Were You There? 4 (Phreesia 09/19/2020)   When Were You Discharged? No data recorded Have You Ever Received Services From [facility] Before? No (Phreesia 09/19/2020)   Who Do You See at [facility]? No data recorded Have You Recently Had Any Thoughts About Hurting Yourself? Yes (Phreesia 09/19/2020)   Are You Planning to Commit Suicide/Harm Yourself At This time?  No (Phreesia 09/19/2020)  Have you Recently Had Thoughts About [place]? No (Phreesia 09/19/2020)   Explanation: No data recorded Have You Used Any Alcohol or Drugs in the Past 24 Hours? No (Phreesia 09/19/2020)   How Long Ago Did You Use Drugs or Alcohol?  No data recorded  What Did You Use and How Much? No data recorded What Do You Feel Would Help You the Most Today? Therapy (Phreesia 09/19/2020)  Do You Currently Have a Therapist/Psychiatrist? Yes (Phreesia 09/19/2020)   Name of Therapist/Psychiatrist: Mrsparker ([place] 09/19/2020)   Have You Been Recently Discharged From Any Office Practice or Programs? No (Phreesia 09/19/2020)   Explanation  of Discharge From Practice/Program:  No data recorded    CCA Screening Triage Referral Assessment Type of Contact: Face-to-Face   Is this  Initial or Reassessment? No data recorded  Date Telepsych consult ordered in [facility]:  09/02/20   Time Telepsych consult ordered in [facility]:  2128  Patient Reported Information Reviewed? Yes   Patient Left Without Being Seen? No data recorded  Reason for Not Completing Assessment: No data recorded Collateral Involvement: Pt declined to provide verbal consent for clinician to speak to friends/family members for collateral information.  Does Patient Have a Stage manager Guardian? No data recorded  Name and Contact of Legal Guardian:  No data recorded If Minor and Not Living with Parent(s), Who has Custody? DSS, though pt is living with her biological mother  Is CPS involved or ever been involved? In the Past  Is APS involved or ever been involved? Never  Patient Determined To Be At Risk for Harm To Self or Others Based on Review of Patient Reported Information or Presenting Complaint? No   Method: No data recorded  Availability of Means: No data recorded  Intent: No data recorded  Notification Required: No data recorded  Additional Information for Danger to Others Potential:  No data recorded  Additional Comments for Danger to Others Potential:  No data recorded  Are There Guns or Other Weapons in Your Home?  No data recorded   Types of Guns/Weapons: No data recorded   Are These Weapons Safely Secured?                              No data recorded   Who Could Verify You Are Able To Have These Secured:    No data recorded Do You Have any Outstanding Charges, Pending Court Dates, Parole/Probation? No data recorded Contacted To Inform of Risk of Harm To Self or Others: No data recorded Location of Assessment: [facility]  Does Patient Present under Involuntary Commitment? No   IVC Papers Initial File Date: No data recorded  [place] of Residence: [place]  Patient Currently Receiving the Following Services: Individual Therapy   Determination of Need: Routine (7  days)   Options For Referral: Outpatient Therapy;Medication Management Lindon Romp, NP, reviewed pt's chart and information and met with pt and determined pt can be psych cleared and can be transported home by the police department. Pt noted a plan for her to seek a psychiatry appointment through the [facility] on Wednesday; it was advised that pt follow-through with this plan.   Dannielle Burn, LMFT

## 2020-09-19 NOTE — ED Notes (Signed)
Patient discharged home. AVS/Follow-Up/Prescriptions  Reviewed with patient and written copy given to patient. Patient verbalized understanding.  All patient belongings returned at discharge. Patient escorted off unit to meet [employer] who took her back home.

## 2020-09-20 ENCOUNTER — Ambulatory Visit (INDEPENDENT_AMBULATORY_CARE_PROVIDER_SITE_OTHER): Payer: Medicaid Other | Admitting: Psychiatry

## 2020-09-20 ENCOUNTER — Encounter (HOSPITAL_COMMUNITY): Payer: Self-pay | Admitting: Psychiatry

## 2020-09-20 VITALS — BP 112/55 | HR 64 | Ht 62.0 in | Wt 161.0 lb

## 2020-09-20 DIAGNOSIS — F3130 Bipolar disorder, current episode depressed, mild or moderate severity, unspecified: Secondary | ICD-10-CM | POA: Insufficient documentation

## 2020-09-20 DIAGNOSIS — F319 Bipolar disorder, unspecified: Secondary | ICD-10-CM | POA: Insufficient documentation

## 2020-09-20 DIAGNOSIS — F4310 Post-traumatic stress disorder, unspecified: Secondary | ICD-10-CM

## 2020-09-20 MED ORDER — ARIPIPRAZOLE 5 MG PO TABS: 5.0000 mg | ORAL_TABLET | Freq: Every day | ORAL | 0 refills | Status: DC

## 2020-09-20 MED ORDER — SERTRALINE HCL 50 MG PO TABS: 50.0000 mg | ORAL_TABLET | Freq: Every day | ORAL | 1 refills | Status: DC

## 2020-09-20 MED ORDER — HYDROXYZINE PAMOATE 25 MG PO CAPS: 25.0000 mg | ORAL_CAPSULE | Freq: Three times a day (TID) | ORAL | 1 refills | Status: DC | PRN

## 2020-09-20 MED ORDER — TRAZODONE HCL 50 MG PO TABS: 50.0000 mg | ORAL_TABLET | Freq: Every day | ORAL | 1 refills | Status: DC
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
## 2020-09-29 ENCOUNTER — Encounter (HOSPITAL_COMMUNITY): Payer: Self-pay | Admitting: Emergency Medicine

## 2020-09-29 ENCOUNTER — Emergency Department (HOSPITAL_COMMUNITY)
Admission: EM | Admit: 2020-09-29 | Discharge: 2020-09-30 | Disposition: A | Discharge status: Home / Self Care | Payer: Medicaid Other | Attending: Emergency Medicine | Admitting: Emergency Medicine

## 2020-09-29 ENCOUNTER — Other Ambulatory Visit: Payer: Self-pay

## 2020-09-29 DIAGNOSIS — R4182 Altered mental status, unspecified: Secondary | ICD-10-CM | POA: Insufficient documentation

## 2020-09-29 DIAGNOSIS — T50901A Poisoning by unspecified drugs, medicaments and biological substances, accidental (unintentional), initial encounter: Secondary | ICD-10-CM

## 2020-09-29 DIAGNOSIS — Z79899 Other long term (current) drug therapy: Secondary | ICD-10-CM | POA: Diagnosis not present

## 2020-09-29 DIAGNOSIS — T40715A Adverse effect of cannabis, initial encounter: Secondary | ICD-10-CM | POA: Insufficient documentation

## 2020-09-29 LAB — RAPID URINE DRUG SCREEN, HOSP PERFORMED
Amphetamines: NOT DETECTED
Barbiturates: NOT DETECTED
Benzodiazepines: NOT DETECTED
Cocaine: NOT DETECTED
Opiates: NOT DETECTED
Tetrahydrocannabinol: NOT DETECTED

## 2020-09-29 LAB — CBC
HCT: 41.5 % (ref 36.0–46.0)
Hemoglobin: 12.7 g/dL (ref 12.0–15.0)
MCH: 23.6 pg — ABNORMAL LOW (ref 26.0–34.0)
MCHC: 30.6 g/dL (ref 30.0–36.0)
MCV: 77.3 fL — ABNORMAL LOW (ref 80.0–100.0)
Platelets: 345 10*3/uL (ref 150–400)
RBC: 5.37 MIL/uL — ABNORMAL HIGH (ref 3.87–5.11)
RDW: 13.4 % (ref 11.5–15.5)
WBC: 8.3 10*3/uL (ref 4.0–10.5)
nRBC: 0 % (ref 0.0–0.2)

## 2020-09-29 LAB — COMPREHENSIVE METABOLIC PANEL
ALT: 15 U/L (ref 0–44)
AST: 16 U/L (ref 15–41)
Albumin: 3.9 g/dL (ref 3.5–5.0)
Alkaline Phosphatase: 70 U/L (ref 38–126)
Anion gap: 11 (ref 5–15)
BUN: 10 mg/dL (ref 6–20)
CO2: 26 mmol/L (ref 22–32)
Calcium: 9.3 mg/dL (ref 8.9–10.3)
Chloride: 102 mmol/L (ref 98–111)
Creatinine, Ser: 0.9 mg/dL (ref 0.44–1.00)
GFR, Estimated: >60 mL/min (ref >60)
Glucose, Bld: 103 mg/dL — ABNORMAL HIGH (ref 70–99)
Potassium: 3.4 mmol/L — ABNORMAL LOW (ref 3.5–5.1)
Sodium: 139 mmol/L (ref 135–145)
Total Bilirubin: 0.7 mg/dL (ref 0.3–1.2)
Total Protein: 7 g/dL (ref 6.5–8.1)

## 2020-09-29 LAB — ACETAMINOPHEN LEVEL: Acetaminophen (Tylenol), Serum: <10 ug/mL — ABNORMAL LOW (ref 10–30)

## 2020-09-29 LAB — I-STAT BETA HCG BLOOD, ED (MC, WL, AP ONLY): I-stat hCG, quantitative: <5 m[IU]/mL (ref <5)

## 2020-09-29 LAB — ETHANOL: Alcohol, Ethyl (B): <10 mg/dL (ref <10)
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
## 2020-09-29 NOTE — ED Notes (Signed)
Pt didn't answer when called for recheck vitals  °
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
## 2020-10-14 ENCOUNTER — Emergency Department (HOSPITAL_COMMUNITY)
Admission: EM | Admit: 2020-10-14 | Discharge: 2020-10-15 | Disposition: A | Discharge status: Home / Self Care | Payer: Medicaid Other | Attending: Emergency Medicine | Admitting: Emergency Medicine

## 2020-10-14 ENCOUNTER — Other Ambulatory Visit: Payer: Self-pay

## 2020-10-14 DIAGNOSIS — F419 Anxiety disorder, unspecified: Secondary | ICD-10-CM | POA: Insufficient documentation

## 2020-10-14 DIAGNOSIS — R202 Paresthesia of skin: Secondary | ICD-10-CM | POA: Insufficient documentation

## 2020-10-14 DIAGNOSIS — T50901A Poisoning by unspecified drugs, medicaments and biological substances, accidental (unintentional), initial encounter: Secondary | ICD-10-CM

## 2020-10-14 DIAGNOSIS — T43011A Poisoning by tricyclic antidepressants, accidental (unintentional), initial encounter: Secondary | ICD-10-CM | POA: Insufficient documentation

## 2020-10-14 DIAGNOSIS — T7421XA Adult sexual abuse, confirmed, initial encounter: Secondary | ICD-10-CM | POA: Insufficient documentation

## 2020-10-14 DIAGNOSIS — R064 Hyperventilation: Secondary | ICD-10-CM | POA: Diagnosis not present

## 2020-10-14 DIAGNOSIS — F410 Panic disorder [episodic paroxysmal anxiety] without agoraphobia: Secondary | ICD-10-CM

## 2020-10-14 DIAGNOSIS — Z0441 Encounter for examination and observation following alleged adult rape: Secondary | ICD-10-CM | POA: Diagnosis not present

## 2020-10-14 NOTE — ED Triage Notes (Signed)
Pt BIB from home c/o drug overdose. EMS stated pt accidentally took 100mg  of trazadone instead of 50mg . Pt a/o x4 but sleepy.

## 2020-10-15 ENCOUNTER — Emergency Department (HOSPITAL_COMMUNITY)
Admission: EM | Admit: 2020-10-15 | Discharge: 2020-10-15 | Disposition: A | Discharge status: Home / Self Care | Payer: Medicaid Other | Source: Home / Self Care | Attending: Emergency Medicine | Admitting: Emergency Medicine

## 2020-10-15 ENCOUNTER — Other Ambulatory Visit: Payer: Self-pay

## 2020-10-15 ENCOUNTER — Ambulatory Visit (HOSPITAL_COMMUNITY)
Admission: RE | Admit: 2020-10-15 | Discharge: 2020-10-15 | Disposition: A | Discharge status: Home / Self Care | Payer: Medicaid Other | Attending: Psychiatry | Admitting: Psychiatry

## 2020-10-15 ENCOUNTER — Other Ambulatory Visit (HOSPITAL_COMMUNITY): Payer: Self-pay | Admitting: Emergency Medicine

## 2020-10-15 ENCOUNTER — Ambulatory Visit (HOSPITAL_COMMUNITY)
Admission: EM | Admit: 2020-10-15 | Discharge: 2020-10-15 | Disposition: A | Discharge status: Home / Self Care | Payer: No Typology Code available for payment source | Source: Ambulatory Visit | Attending: Emergency Medicine | Admitting: Emergency Medicine

## 2020-10-15 ENCOUNTER — Encounter (HOSPITAL_COMMUNITY): Payer: Self-pay

## 2020-10-15 DIAGNOSIS — Z0441 Encounter for examination and observation following alleged adult rape: Secondary | ICD-10-CM | POA: Diagnosis present

## 2020-10-15 DIAGNOSIS — T7421XA Adult sexual abuse, confirmed, initial encounter: Secondary | ICD-10-CM | POA: Insufficient documentation

## 2020-10-15 DIAGNOSIS — Z5321 Procedure and treatment not carried out due to patient leaving prior to being seen by health care provider: Secondary | ICD-10-CM | POA: Insufficient documentation

## 2020-10-15 DIAGNOSIS — F319 Bipolar disorder, unspecified: Secondary | ICD-10-CM

## 2020-10-15 DIAGNOSIS — R1110 Vomiting, unspecified: Secondary | ICD-10-CM | POA: Insufficient documentation

## 2020-10-15 LAB — COMPREHENSIVE METABOLIC PANEL
ALT: 15 U/L (ref 0–44)
ALT: 14 U/L (ref 0–44)
AST: 14 U/L — ABNORMAL LOW (ref 15–41)
AST: 17 U/L (ref 15–41)
Albumin: 3.6 g/dL (ref 3.5–5.0)
Albumin: 4.2 g/dL (ref 3.5–5.0)
Alkaline Phosphatase: 58 U/L (ref 38–126)
Alkaline Phosphatase: 65 U/L (ref 38–126)
Anion gap: 11 (ref 5–15)
Anion gap: 11 (ref 5–15)
BUN: 8 mg/dL (ref 6–20)
BUN: 9 mg/dL (ref 6–20)
CO2: 23 mmol/L (ref 22–32)
CO2: 23 mmol/L (ref 22–32)
Calcium: 8.9 mg/dL (ref 8.9–10.3)
Calcium: 9.1 mg/dL (ref 8.9–10.3)
Chloride: 106 mmol/L (ref 98–111)
Chloride: 103 mmol/L (ref 98–111)
Creatinine, Ser: 0.85 mg/dL (ref 0.44–1.00)
Creatinine, Ser: 0.76 mg/dL (ref 0.44–1.00)
GFR, Estimated: >60 mL/min (ref >60)
GFR, Estimated: >60 mL/min (ref >60)
Glucose, Bld: 115 mg/dL — ABNORMAL HIGH (ref 70–99)
Glucose, Bld: 121 mg/dL — ABNORMAL HIGH (ref 70–99)
Potassium: 3.9 mmol/L (ref 3.5–5.1)
Potassium: 3.3 mmol/L — ABNORMAL LOW (ref 3.5–5.1)
Sodium: 140 mmol/L (ref 135–145)
Sodium: 137 mmol/L (ref 135–145)
Total Bilirubin: 0.8 mg/dL (ref 0.3–1.2)
Total Bilirubin: 1.1 mg/dL (ref 0.3–1.2)
Total Protein: 6.3 g/dL — ABNORMAL LOW (ref 6.5–8.1)
Total Protein: 7.4 g/dL (ref 6.5–8.1)

## 2020-10-15 LAB — CBC
HCT: 39.9 % (ref 36.0–46.0)
Hemoglobin: 12.7 g/dL (ref 12.0–15.0)
MCH: 24.2 pg — ABNORMAL LOW (ref 26.0–34.0)
MCHC: 31.8 g/dL (ref 30.0–36.0)
MCV: 76 fL — ABNORMAL LOW (ref 80.0–100.0)
Platelets: 345 10*3/uL (ref 150–400)
RBC: 5.25 MIL/uL — ABNORMAL HIGH (ref 3.87–5.11)
RDW: 13.3 % (ref 11.5–15.5)
WBC: 8.5 10*3/uL (ref 4.0–10.5)
nRBC: 0 % (ref 0.0–0.2)

## 2020-10-15 LAB — RAPID HIV SCREEN (HIV 1/2 AB+AG)
HIV 1/2 Antibodies: NONREACTIVE
HIV-1 P24 Antigen - HIV24: NONREACTIVE

## 2020-10-15 LAB — HEPATITIS C ANTIBODY: HCV Ab: NONREACTIVE

## 2020-10-15 LAB — HEPATITIS B SURFACE ANTIGEN: Hepatitis B Surface Ag: NONREACTIVE

## 2020-10-15 LAB — LIPASE, BLOOD: Lipase: 23 U/L (ref 11–51)

## 2020-10-15 LAB — I-STAT BETA HCG BLOOD, ED (MC, WL, AP ONLY): I-stat hCG, quantitative: <5 m[IU]/mL (ref <5)

## 2020-10-15 LAB — POC URINE PREG, ED: Preg Test, Ur: NEGATIVE

## 2020-10-15 MED ORDER — AZITHROMYCIN 250 MG PO TABS
1000.0000 mg | ORAL_TABLET | Freq: Once | ORAL | Status: AC
  Administered 2020-10-15: 1000 mg via ORAL

## 2020-10-15 MED ORDER — METRONIDAZOLE 500 MG PO TABS
2000.0000 mg | ORAL_TABLET | Freq: Once | ORAL | Status: AC
  Administered 2020-10-15: 2000 mg via ORAL

## 2020-10-15 MED ORDER — LIDOCAINE HCL (PF) 1 % IJ SOLN: 1.0000 mL | Freq: Once | INTRAMUSCULAR | Status: DC

## 2020-10-15 MED ORDER — ULIPRISTAL ACETATE 30 MG PO TABS
30.0000 mg | ORAL_TABLET | Freq: Once | ORAL | Status: AC
  Administered 2020-10-15: 30 mg via ORAL

## 2020-10-15 MED ORDER — CEFTRIAXONE SODIUM 500 MG IJ SOLR: 500.0000 mg | Freq: Once | INTRAMUSCULAR | Status: DC

## 2020-10-15 MED ORDER — ELVITEG-COBIC-EMTRICIT-TENOFAF 150-150-200-10 MG PO TABS: 1.0000 | ORAL_TABLET | Freq: Every day | ORAL | 0 refills | Status: DC

## 2020-10-15 MED ORDER — ELVITEG-COBIC-EMTRICIT-TENOFAF 150-150-200-10 MG PREPACK
ORAL_TABLET | ORAL | Status: AC
  Administered 2020-10-15: 5
  Filled 2020-10-15: qty 1
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
## 2020-10-15 NOTE — ED Triage Notes (Signed)
Pt is with [employer] in consult room at this time
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
## 2020-10-15 NOTE — Discharge Instructions (Addendum)
You accidentally took an extra dose of trazodone.  This medication will not cause you any harm.  I suspect some of your symptoms tonight were due to an anxiety attack.
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
## 2020-10-16 ENCOUNTER — Telehealth (HOSPITAL_COMMUNITY): Payer: Self-pay

## 2020-10-16 ENCOUNTER — Emergency Department (HOSPITAL_COMMUNITY)
Admission: EM | Admit: 2020-10-16 | Discharge: 2020-10-16 | Disposition: A | Discharge status: Home / Self Care | Payer: Medicaid Other | Attending: Emergency Medicine | Admitting: Emergency Medicine

## 2020-10-16 MED FILL — GENVOYA TABLET: 150-150-200 | 30 days supply | Qty: 30 | Fill #0

## 2020-10-16 NOTE — Telephone Encounter (Signed)
Care Management - Follow Up [facility] Discharges   Writer made contact with the patient.  Patient reports that she a follow up appointment with  Dr Evelene Croon at [facility] on 11/06/2020.
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
## 2020-10-18 LAB — RPR: RPR Ser Ql: NONREACTIVE

## 2020-10-27 ENCOUNTER — Ambulatory Visit: Payer: Medicaid Other

## 2020-11-06 ENCOUNTER — Other Ambulatory Visit: Payer: Self-pay

## 2020-11-06 ENCOUNTER — Ambulatory Visit (INDEPENDENT_AMBULATORY_CARE_PROVIDER_SITE_OTHER): Payer: Medicaid Other | Admitting: Psychiatry

## 2020-11-06 ENCOUNTER — Encounter (HOSPITAL_COMMUNITY): Payer: Self-pay | Admitting: Psychiatry

## 2020-11-06 DIAGNOSIS — F4310 Post-traumatic stress disorder, unspecified: Secondary | ICD-10-CM | POA: Diagnosis not present

## 2020-11-06 DIAGNOSIS — F3130 Bipolar disorder, current episode depressed, mild or moderate severity, unspecified: Secondary | ICD-10-CM

## 2020-11-06 MED ORDER — BUSPIRONE HCL 10 MG PO TABS: 10.0000 mg | ORAL_TABLET | Freq: Two times a day (BID) | ORAL | 1 refills | Status: DC

## 2020-11-06 MED ORDER — SERTRALINE HCL 50 MG PO TABS: 50.0000 mg | ORAL_TABLET | Freq: Every day | ORAL | 1 refills | Status: DC

## 2020-11-06 MED ORDER — ARIPIPRAZOLE 10 MG PO TABS: 10.0000 mg | ORAL_TABLET | Freq: Every day | ORAL | 1 refills | Status: DC

## 2020-11-06 MED ORDER — TRAZODONE HCL 50 MG PO TABS: 50.0000 mg | ORAL_TABLET | Freq: Every day | ORAL | 1 refills | Status: DC
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
## 2020-12-04 ENCOUNTER — Other Ambulatory Visit: Payer: Self-pay

## 2020-12-04 ENCOUNTER — Encounter (HOSPITAL_COMMUNITY): Payer: Self-pay | Admitting: Emergency Medicine

## 2020-12-04 ENCOUNTER — Emergency Department (HOSPITAL_COMMUNITY)
Admission: EM | Admit: 2020-12-04 | Discharge: 2020-12-05 | Disposition: A | Discharge status: Home / Self Care | Payer: Medicaid Other | Attending: Emergency Medicine | Admitting: Emergency Medicine

## 2020-12-04 DIAGNOSIS — Z20822 Contact with and (suspected) exposure to covid-19: Secondary | ICD-10-CM | POA: Diagnosis not present

## 2020-12-04 DIAGNOSIS — Y999 Unspecified external cause status: Secondary | ICD-10-CM | POA: Diagnosis not present

## 2020-12-04 DIAGNOSIS — R45851 Suicidal ideations: Secondary | ICD-10-CM | POA: Diagnosis not present

## 2020-12-04 DIAGNOSIS — S40021A Contusion of right upper arm, initial encounter: Secondary | ICD-10-CM | POA: Diagnosis not present

## 2020-12-04 DIAGNOSIS — Y9289 Other specified places as the place of occurrence of the external cause: Secondary | ICD-10-CM | POA: Diagnosis not present

## 2020-12-04 DIAGNOSIS — F4310 Post-traumatic stress disorder, unspecified: Secondary | ICD-10-CM | POA: Insufficient documentation

## 2020-12-04 DIAGNOSIS — Z79899 Other long term (current) drug therapy: Secondary | ICD-10-CM | POA: Diagnosis not present

## 2020-12-04 DIAGNOSIS — R42 Dizziness and giddiness: Secondary | ICD-10-CM | POA: Diagnosis present

## 2020-12-04 DIAGNOSIS — Y9389 Activity, other specified: Secondary | ICD-10-CM | POA: Diagnosis not present

## 2020-12-04 DIAGNOSIS — F314 Bipolar disorder, current episode depressed, severe, without psychotic features: Secondary | ICD-10-CM | POA: Diagnosis not present
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
## 2020-12-05 LAB — COMPREHENSIVE METABOLIC PANEL
ALT: 11 U/L (ref 0–44)
AST: 15 U/L (ref 15–41)
Albumin: 4.5 g/dL (ref 3.5–5.0)
Alkaline Phosphatase: 53 U/L (ref 38–126)
Anion gap: 10 (ref 5–15)
BUN: 8 mg/dL (ref 6–20)
CO2: 25 mmol/L (ref 22–32)
Calcium: 9.3 mg/dL (ref 8.9–10.3)
Chloride: 104 mmol/L (ref 98–111)
Creatinine, Ser: 0.72 mg/dL (ref 0.44–1.00)
GFR, Estimated: >60 mL/min (ref >60)
Glucose, Bld: 112 mg/dL — ABNORMAL HIGH (ref 70–99)
Potassium: 3.4 mmol/L — ABNORMAL LOW (ref 3.5–5.1)
Sodium: 139 mmol/L (ref 135–145)
Total Bilirubin: 0.5 mg/dL (ref 0.3–1.2)
Total Protein: 7.6 g/dL (ref 6.5–8.1)

## 2020-12-05 LAB — RAPID URINE DRUG SCREEN, HOSP PERFORMED
Amphetamines: NOT DETECTED
Barbiturates: NOT DETECTED
Benzodiazepines: NOT DETECTED
Cocaine: NOT DETECTED
Opiates: NOT DETECTED
Tetrahydrocannabinol: POSITIVE — AB

## 2020-12-05 LAB — RESP PANEL BY RT-PCR (FLU A&B, COVID) ARPGX2
Influenza A by PCR: NEGATIVE
Influenza B by PCR: NEGATIVE
SARS Coronavirus 2 by RT PCR: NEGATIVE

## 2020-12-05 LAB — CBC
HCT: 40.3 % (ref 36.0–46.0)
Hemoglobin: 13 g/dL (ref 12.0–15.0)
MCH: 24.4 pg — ABNORMAL LOW (ref 26.0–34.0)
MCHC: 32.3 g/dL (ref 30.0–36.0)
MCV: 75.8 fL — ABNORMAL LOW (ref 80.0–100.0)
Platelets: 316 10*3/uL (ref 150–400)
RBC: 5.32 MIL/uL — ABNORMAL HIGH (ref 3.87–5.11)
RDW: 13.2 % (ref 11.5–15.5)
WBC: 6.7 10*3/uL (ref 4.0–10.5)
nRBC: 0 % (ref 0.0–0.2)

## 2020-12-05 LAB — I-STAT BETA HCG BLOOD, ED (MC, WL, AP ONLY): I-stat hCG, quantitative: <5 m[IU]/mL (ref <5)

## 2020-12-05 LAB — ETHANOL: Alcohol, Ethyl (B): <10 mg/dL (ref <10)

## 2020-12-05 LAB — ACETAMINOPHEN LEVEL: Acetaminophen (Tylenol), Serum: <10 ug/mL — ABNORMAL LOW (ref 10–30)

## 2020-12-05 LAB — SALICYLATE LEVEL: Salicylate Lvl: <7 mg/dL — ABNORMAL LOW (ref 7.0–30.0)

## 2020-12-05 NOTE — ED Notes (Signed)
Patient belonging are in triage nursing station. Patient bag contains clothing, slippers and phone. Patient has changed into appropriate scrubs.
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
## 2020-12-05 NOTE — ED Provider Notes (Signed)
Pt has been psych cleared for d/c.  She is to follow up as an outpatient.   Jacalyn Lefevre, MD 12/05/20 [phone]
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
## 2020-12-05 NOTE — BH Assessment (Signed)
[facility] Assessment Progress Note  Per Shuvon Rankin, NP, this voluntary pt does not require psychiatric hospitalization at this time.  Pt is psychiatrically cleared.  Pt has an appointment scheduled with Zena Amos, MD at [facility] on Tuesday, December 19, 2020 at 14:40.  This has been included in pt's discharge instructions.  EDP Jacalyn Lefevre, MD and pt's nurse, Silvio Pate, have been notified.  Doylene Canning, MA Triage Specialist [phone]

## 2020-12-05 NOTE — Discharge Instructions (Signed)
For your behavioral health needs you are advised to continue treatment with [facility].  Your next appointment with Zena Amos, MD is scheduled for Tuesday, December 19, 2020 at 2:40 pm:       [facility]      [address]      [place], [place] [address]      [phone]

## 2020-12-19 ENCOUNTER — Ambulatory Visit (HOSPITAL_COMMUNITY): Payer: Medicaid Other | Admitting: Psychiatry

## 2021-01-04 ENCOUNTER — Other Ambulatory Visit (HOSPITAL_BASED_OUTPATIENT_CLINIC_OR_DEPARTMENT_OTHER): Payer: Self-pay

## 2021-01-15 ENCOUNTER — Other Ambulatory Visit (HOSPITAL_COMMUNITY): Payer: Self-pay | Admitting: Psychiatry

## 2021-01-15 DIAGNOSIS — F3130 Bipolar disorder, current episode depressed, mild or moderate severity, unspecified: Secondary | ICD-10-CM

## 2021-01-22 ENCOUNTER — Other Ambulatory Visit: Payer: Self-pay

## 2021-01-22 ENCOUNTER — Emergency Department (HOSPITAL_COMMUNITY)
Admission: EM | Admit: 2021-01-22 | Discharge: 2021-01-22 | Disposition: A | Discharge status: Home / Self Care | Payer: Medicaid Other | Attending: Emergency Medicine | Admitting: Emergency Medicine

## 2021-01-22 DIAGNOSIS — N760 Acute vaginitis: Secondary | ICD-10-CM | POA: Diagnosis not present

## 2021-01-22 DIAGNOSIS — B9689 Other specified bacterial agents as the cause of diseases classified elsewhere: Secondary | ICD-10-CM | POA: Diagnosis not present

## 2021-01-22 DIAGNOSIS — L292 Pruritus vulvae: Secondary | ICD-10-CM | POA: Diagnosis present

## 2021-01-22 LAB — URINALYSIS, ROUTINE W REFLEX MICROSCOPIC
Bilirubin Urine: NEGATIVE
Glucose, UA: NEGATIVE mg/dL
Hgb urine dipstick: NEGATIVE
Ketones, ur: NEGATIVE mg/dL
Nitrite: NEGATIVE
Protein, ur: NEGATIVE mg/dL
Specific Gravity, Urine: 1.027 (ref 1.005–1.030)
pH: 7 (ref 5.0–8.0)

## 2021-01-22 LAB — WET PREP, GENITAL
Sperm: NONE SEEN
Trich, Wet Prep: NONE SEEN
Yeast Wet Prep HPF POC: NONE SEEN

## 2021-01-22 LAB — PREGNANCY, URINE: Preg Test, Ur: NEGATIVE

## 2021-01-22 MED ORDER — METRONIDAZOLE 500 MG PO TABS: 500.0000 mg | ORAL_TABLET | Freq: Two times a day (BID) | ORAL | 0 refills | Status: DC

## 2021-01-22 MED ORDER — METRONIDAZOLE 500 MG PO TABS
500.0000 mg | ORAL_TABLET | Freq: Once | ORAL | Status: AC
  Administered 2021-01-22: 500 mg via ORAL
  Filled 2021-01-22: qty 1
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
## 2021-01-22 NOTE — ED Triage Notes (Signed)
Emergency Medicine Provider Triage Evaluation Note  Janet Horn , a 20 y.o. female  was evaluated in triage.  Pt complains of vaginal pruritus.  Occasionally has white discharge.  Last sexually active 1 week ago.  Would like to be checked for STDs.  No abdominal pain.  No dysuria, hematuria  Review of Systems  Positive: Vaginal discharge Negative: Abdominal pain, dysuria, hematuria  Physical Exam  BP 110/64   Pulse 67   Temp 98.2 F (36.8 C) (Oral)   Resp 18   LMP 12/26/2020 (Exact Date)   SpO2 95%  Gen:   Awake, no distress   HEENT:  Atraumatic  Resp:  Normal effort  Cardiac:  Normal rate  Abd:   Nondistended, nontender  MSK:   Moves extremities without difficulty  Neuro:  Speech clear   Medical Decision Making  Medically screening exam initiated at 1:30 PM.  Appropriate orders placed.  [name]-Kaylene Gethers was informed that the remainder of the evaluation will be completed by another provider, this initial triage assessment does not replace that evaluation, and the importance of remaining in the ED until their evaluation is complete.  Clinical Impression  Vaginal discharge   Haruko Mersch A, PA-C 01/22/21 1331

## 2021-01-22 NOTE — ED Triage Notes (Signed)
Pt reports vaginal itching x 3 days. Reports doing OTC tx for yeast infection without improvement. Would like to be checked for STDs.

## 2021-01-22 NOTE — Discharge Instructions (Addendum)
Please read the instructions below.  Talk with your primary care provider about any new medications.  Please schedule an appointment for follow up with your OBGYN or primary care.  Finish your antibiotic (Flagyl/Metronidazole) as prescribed. Do not drink alcohol with this medication as it will cause vomiting.  You will receive a call from the hospital if your test results come back positive. Avoid all sexual activity until you know your test results. If your results come back positive, it is important that you inform all of your sexual partners.  Return to the ER for high fever, severe abdominal pain, or new or worsening symptoms.

## 2021-01-23 ENCOUNTER — Other Ambulatory Visit: Payer: Self-pay

## 2021-01-23 LAB — GC/CHLAMYDIA PROBE AMP (~~LOC~~) NOT AT ARMC
Chlamydia: POSITIVE — AB
Comment: NEGATIVE
Comment: NORMAL
Neisseria Gonorrhea: NEGATIVE

## 2021-01-23 MED ORDER — AZITHROMYCIN 250 MG PO TABS: 1000.0000 mg | ORAL_TABLET | Freq: Once | ORAL | 0 refills | Status: AC

## 2021-02-15 ENCOUNTER — Other Ambulatory Visit (HOSPITAL_COMMUNITY): Payer: Self-pay | Admitting: Psychiatry

## 2021-02-15 DIAGNOSIS — F3130 Bipolar disorder, current episode depressed, mild or moderate severity, unspecified: Secondary | ICD-10-CM

## 2021-02-15 MED ORDER — BUSPIRONE HCL 10 MG PO TABS: 10.0000 mg | ORAL_TABLET | Freq: Two times a day (BID) | ORAL | 1 refills | Status: DC

## 2021-02-15 MED ORDER — ARIPIPRAZOLE 10 MG PO TABS: 10.0000 mg | ORAL_TABLET | Freq: Every day | ORAL | 1 refills | Status: DC

## 2021-02-19 ENCOUNTER — Telehealth (HOSPITAL_COMMUNITY): Payer: Self-pay | Admitting: *Deleted

## 2021-02-19 NOTE — Telephone Encounter (Signed)
Call from patient stating she just spoke with her pharmacy and she needs permission from Dr Evelene Croon to fill her Abilify. Unsure if she is speaking of a prior auth but will follow up with her request. Called the pharmacy, PA not required the pharmacy doesn't have a rx on file for her or refills. Her chart on our end indicates she has a rx of Abilify from 02/15/21 for one month with a refill, [facility] says they dont have that one. Called in rx as written in chart to pharmacy. Patient has an appt with Dr Evelene Croon on 5/17. Patient notified of actions taken and her next appt.

## 2021-02-20 ENCOUNTER — Other Ambulatory Visit (HOSPITAL_COMMUNITY): Payer: Self-pay | Admitting: Psychiatry

## 2021-02-20 DIAGNOSIS — F3130 Bipolar disorder, current episode depressed, mild or moderate severity, unspecified: Secondary | ICD-10-CM

## 2021-02-27 ENCOUNTER — Telehealth (INDEPENDENT_AMBULATORY_CARE_PROVIDER_SITE_OTHER): Payer: Medicaid Other | Admitting: Psychiatry

## 2021-02-27 ENCOUNTER — Encounter (HOSPITAL_COMMUNITY): Payer: Self-pay | Admitting: Psychiatry

## 2021-02-27 DIAGNOSIS — F603 Borderline personality disorder: Secondary | ICD-10-CM | POA: Insufficient documentation

## 2021-02-27 DIAGNOSIS — F3130 Bipolar disorder, current episode depressed, mild or moderate severity, unspecified: Secondary | ICD-10-CM | POA: Diagnosis not present

## 2021-02-27 DIAGNOSIS — F4310 Post-traumatic stress disorder, unspecified: Secondary | ICD-10-CM

## 2021-02-27 MED ORDER — ARIPIPRAZOLE 10 MG PO TABS: 10.0000 mg | ORAL_TABLET | Freq: Every day | ORAL | 1 refills | Status: DC

## 2021-02-27 MED ORDER — BUSPIRONE HCL 10 MG PO TABS: 10.0000 mg | ORAL_TABLET | Freq: Three times a day (TID) | ORAL | 1 refills | Status: DC

## 2021-02-27 MED ORDER — SERTRALINE HCL 100 MG PO TABS: 100.0000 mg | ORAL_TABLET | Freq: Every day | ORAL | 1 refills | Status: DC

## 2021-02-27 MED ORDER — TRAZODONE HCL 50 MG PO TABS: 50.0000 mg | ORAL_TABLET | Freq: Every day | ORAL | 1 refills | Status: DC
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
## 2021-03-14 ENCOUNTER — Other Ambulatory Visit: Payer: Self-pay

## 2021-03-14 ENCOUNTER — Emergency Department (HOSPITAL_COMMUNITY): Payer: Medicaid Other

## 2021-03-14 ENCOUNTER — Emergency Department (HOSPITAL_COMMUNITY)
Admission: EM | Admit: 2021-03-14 | Discharge: 2021-03-14 | Disposition: A | Discharge status: Home / Self Care | Payer: Medicaid Other | Attending: Emergency Medicine | Admitting: Emergency Medicine

## 2021-03-14 DIAGNOSIS — K625 Hemorrhage of anus and rectum: Secondary | ICD-10-CM | POA: Diagnosis not present

## 2021-03-14 DIAGNOSIS — R1110 Vomiting, unspecified: Secondary | ICD-10-CM | POA: Diagnosis not present

## 2021-03-14 DIAGNOSIS — R1031 Right lower quadrant pain: Secondary | ICD-10-CM | POA: Diagnosis not present

## 2021-03-14 DIAGNOSIS — R112 Nausea with vomiting, unspecified: Secondary | ICD-10-CM | POA: Diagnosis not present

## 2021-03-14 DIAGNOSIS — K6289 Other specified diseases of anus and rectum: Secondary | ICD-10-CM | POA: Insufficient documentation

## 2021-03-14 LAB — CBC WITH DIFFERENTIAL/PLATELET
Abs Immature Granulocytes: 0.01 10*3/uL (ref 0.00–0.07)
Basophils Absolute: 0 10*3/uL (ref 0.0–0.1)
Basophils Relative: 1 %
Eosinophils Absolute: 0.2 10*3/uL (ref 0.0–0.5)
Eosinophils Relative: 4 %
HCT: 42 % (ref 36.0–46.0)
Hemoglobin: 12.9 g/dL (ref 12.0–15.0)
Immature Granulocytes: 0 %
Lymphocytes Relative: 32 %
Lymphs Abs: 2 10*3/uL (ref 0.7–4.0)
MCH: 24.1 pg — ABNORMAL LOW (ref 26.0–34.0)
MCHC: 30.7 g/dL (ref 30.0–36.0)
MCV: 78.4 fL — ABNORMAL LOW (ref 80.0–100.0)
Monocytes Absolute: 0.5 10*3/uL (ref 0.1–1.0)
Monocytes Relative: 8 %
Neutro Abs: 3.6 10*3/uL (ref 1.7–7.7)
Neutrophils Relative %: 55 %
Platelets: 356 10*3/uL (ref 150–400)
RBC: 5.36 MIL/uL — ABNORMAL HIGH (ref 3.87–5.11)
RDW: 13.4 % (ref 11.5–15.5)
WBC: 6.3 10*3/uL (ref 4.0–10.5)
nRBC: 0 % (ref 0.0–0.2)

## 2021-03-14 LAB — URINALYSIS, ROUTINE W REFLEX MICROSCOPIC
Bilirubin Urine: NEGATIVE
Glucose, UA: NEGATIVE mg/dL
Hgb urine dipstick: NEGATIVE
Ketones, ur: NEGATIVE mg/dL
Leukocytes,Ua: NEGATIVE
Nitrite: NEGATIVE
Protein, ur: NEGATIVE mg/dL
Specific Gravity, Urine: 1.008 (ref 1.005–1.030)
pH: 6 (ref 5.0–8.0)

## 2021-03-14 LAB — COMPREHENSIVE METABOLIC PANEL
ALT: 17 U/L (ref 0–44)
AST: 16 U/L (ref 15–41)
Albumin: 3.9 g/dL (ref 3.5–5.0)
Alkaline Phosphatase: 54 U/L (ref 38–126)
Anion gap: 8 (ref 5–15)
BUN: 8 mg/dL (ref 6–20)
CO2: 25 mmol/L (ref 22–32)
Calcium: 9.2 mg/dL (ref 8.9–10.3)
Chloride: 103 mmol/L (ref 98–111)
Creatinine, Ser: 0.75 mg/dL (ref 0.44–1.00)
GFR, Estimated: >60 mL/min (ref >60)
Glucose, Bld: 94 mg/dL (ref 70–99)
Potassium: 3.7 mmol/L (ref 3.5–5.1)
Sodium: 136 mmol/L (ref 135–145)
Total Bilirubin: 0.5 mg/dL (ref 0.3–1.2)
Total Protein: 6.7 g/dL (ref 6.5–8.1)

## 2021-03-14 LAB — POC OCCULT BLOOD, ED: Fecal Occult Bld: NEGATIVE

## 2021-03-14 LAB — PREGNANCY, URINE: Preg Test, Ur: NEGATIVE

## 2021-03-14 LAB — LIPASE, BLOOD: Lipase: 33 U/L (ref 11–51)

## 2021-03-14 MED ORDER — SODIUM CHLORIDE 0.9 % IV BOLUS
1000.0000 mL | Freq: Once | INTRAVENOUS | Status: AC
  Administered 2021-03-14: 1000 mL via INTRAVENOUS

## 2021-03-14 MED ORDER — ONDANSETRON HCL 4 MG/2ML IJ SOLN
4.0000 mg | Freq: Once | INTRAMUSCULAR | Status: AC
  Administered 2021-03-14: 4 mg via INTRAVENOUS
  Filled 2021-03-14: qty 2

## 2021-03-14 NOTE — ED Notes (Signed)
Provided pt with warm blanket. Contacted provider to discuss if pt can get a [place] sandwich.

## 2021-03-14 NOTE — ED Provider Notes (Signed)
Emergency Medicine Provider Triage Evaluation Note  Janet Horn , a 20 y.o. female  was evaluated in triage.  Pt complains of rectal bleeding.  Review of Systems  Positive: abd pain, nausea, constipation Negative: Dysuria, cp, sob  Physical Exam  BP 120/78 (BP Location: Left Arm)   Pulse 75   Temp 98.8 F (37.1 C) (Oral)   Resp 16   SpO2 100%  Gen:   Awake, no distress   Resp:  Normal effort  MSK:   Moves extremities without difficulty  Other:    Medical Decision Making  Medically screening exam initiated at 1:53 PM.  Appropriate orders placed.  Janet Horn was informed that the remainder of the evaluation will be completed by another provider, this initial triage assessment does not replace that evaluation, and the importance of remaining in the ED until their evaluation is complete.  RLQ tenderness x 2-3 days, nausea, vomiting, noticing blood per rectum with rectal pain.  Was constipated initially. No hx of Crohn's or UC.     Fayrene Helper, PA-C 03/14/21 1354    Milagros Loll, MD 03/15/21 [phone]

## 2021-03-14 NOTE — ED Notes (Signed)
Pt tolerated the PO challenge well

## 2021-03-14 NOTE — ED Notes (Signed)
Patient transported to CT 
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
## 2021-03-14 NOTE — ED Triage Notes (Signed)
Pt reports rectal bleeding and pain, abdominal cramping, back pain, and coffee ground emesis x 3-4 days.

## 2021-03-14 NOTE — ED Notes (Addendum)
Provided pt with a [place] sandwich and a cup of water.

## 2021-03-14 NOTE — ED Notes (Signed)
Pt returned from CT °

## 2021-03-14 NOTE — ED Notes (Signed)
Placed BSC at the bedside because pt felt dizzy while standing.

## 2021-03-14 NOTE — Discharge Instructions (Addendum)
You are provided with results of your CT scan on today's visit.  The number to lumbar gastroenterology is attached to your chart, please schedule an appointment in order to follow-up with them for your chronic constipation.  We did discuss use of MiraLAX daily, 1 capful should be placed in a 8oz glass of water until bowel movements are soft.

## 2021-03-22 ENCOUNTER — Ambulatory Visit (HOSPITAL_COMMUNITY): Payer: Medicaid Other | Admitting: Licensed Clinical Social Worker

## 2021-03-22 ENCOUNTER — Telehealth (HOSPITAL_COMMUNITY): Payer: Self-pay | Admitting: Licensed Clinical Social Worker

## 2021-03-22 ENCOUNTER — Other Ambulatory Visit: Payer: Self-pay

## 2021-03-22 NOTE — Telephone Encounter (Signed)
LCSW sent text link for video session per schedule. Pt noted to be trying to sign on but did not seem to be able to hear this clinician. LCSW called pt who answers and advises her connection was not working well, states she has a lot going on. She asks to be rescheduled.

## 2021-03-28 ENCOUNTER — Encounter: Payer: Medicaid Other | Admitting: Family Medicine

## 2021-04-03 ENCOUNTER — Ambulatory Visit (INDEPENDENT_AMBULATORY_CARE_PROVIDER_SITE_OTHER): Payer: Medicaid Other

## 2021-04-03 ENCOUNTER — Other Ambulatory Visit (HOSPITAL_COMMUNITY)
Admission: RE | Admit: 2021-04-03 | Discharge: 2021-04-03 | Disposition: A | Discharge status: Home / Self Care | Payer: Medicaid Other | Source: Ambulatory Visit | Attending: Family Medicine | Admitting: Family Medicine

## 2021-04-03 ENCOUNTER — Other Ambulatory Visit: Payer: Self-pay

## 2021-04-03 VITALS — BP 109/66 | HR 86 | Wt 159.2 lb

## 2021-04-03 DIAGNOSIS — Z0000 Encounter for general adult medical examination without abnormal findings: Secondary | ICD-10-CM | POA: Diagnosis not present

## 2021-04-03 DIAGNOSIS — Z3202 Encounter for pregnancy test, result negative: Secondary | ICD-10-CM | POA: Diagnosis not present

## 2021-04-03 DIAGNOSIS — Z113 Encounter for screening for infections with a predominantly sexual mode of transmission: Secondary | ICD-10-CM | POA: Insufficient documentation

## 2021-04-03 DIAGNOSIS — Z3042 Encounter for surveillance of injectable contraceptive: Secondary | ICD-10-CM

## 2021-04-03 DIAGNOSIS — Z91410 Personal history of adult physical and sexual abuse: Secondary | ICD-10-CM | POA: Diagnosis not present

## 2021-04-03 LAB — POCT PREGNANCY, URINE: Preg Test, Ur: NEGATIVE

## 2021-04-03 MED ORDER — MEDROXYPROGESTERONE ACETATE 150 MG/ML IM SUSP
150.0000 mg | Freq: Once | INTRAMUSCULAR | Status: AC
  Administered 2021-04-03: 150 mg via INTRAMUSCULAR
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
## 2021-04-04 LAB — CERVICOVAGINAL ANCILLARY ONLY
Bacterial Vaginitis (gardnerella): NEGATIVE
Candida Glabrata: NEGATIVE
Candida Vaginitis: NEGATIVE
Chlamydia: POSITIVE — AB
Comment: NEGATIVE
Comment: NEGATIVE
Comment: NEGATIVE
Comment: NEGATIVE
Comment: NEGATIVE
Comment: NORMAL
Neisseria Gonorrhea: NEGATIVE
Trichomonas: NEGATIVE

## 2021-04-04 LAB — HEPATITIS B SURFACE ANTIGEN: Hepatitis B Surface Ag: NEGATIVE

## 2021-04-04 LAB — HEPATITIS C ANTIBODY: Hep C Virus Ab: 0.2 s/co ratio (ref 0.0–0.9)

## 2021-04-04 LAB — RPR: RPR Ser Ql: NONREACTIVE

## 2021-04-04 LAB — HIV ANTIBODY (ROUTINE TESTING W REFLEX): HIV Screen 4th Generation wRfx: NONREACTIVE

## 2021-04-05 ENCOUNTER — Encounter: Payer: Self-pay | Admitting: *Deleted

## 2021-04-05 ENCOUNTER — Telehealth: Payer: Self-pay | Admitting: *Deleted

## 2021-04-05 DIAGNOSIS — A749 Chlamydial infection, unspecified: Secondary | ICD-10-CM

## 2021-04-05 MED ORDER — AZITHROMYCIN 250 MG PO TABS
1000.0000 mg | ORAL_TABLET | Freq: Once | ORAL | 0 refills | Status: AC
Start: 2021-04-05 — End: 2021-04-05

## 2021-04-05 NOTE — Telephone Encounter (Signed)
Message from provider:  Brand Males, CNM  P [facility] Clinical Pool Patient tested positive for chlamydia. All other STD testing was negative. Can you please send 1g azithromycin PO to patient's pharmacy?   Thanks!  Danielle, CNM       Patient left a voice message this pm that she is trying to get help to get into her MyChart account and also wants to know if there are any results she needs to know about.  Per chart review + for Chlamydia.  I called patient and left message I was returning her call and since I can not leave a MyChart message I will let you know we are sending a prescription to your pharmacy. Please call us tomorrow to discuss. Will leave in inbox to call patient again. STD report completed.  Kerilyn Cortner,RN

## 2021-04-06 NOTE — Telephone Encounter (Signed)
Called pt; results given. Explained pt will need to pick up medication from pharmacy and take all 4 pills at one time. Pt encouraged to notify all recent partner of need to be treated for chlamydia and to not resume intercourse for 2 weeks following treatment.

## 2021-04-10 ENCOUNTER — Telehealth: Payer: Self-pay

## 2021-04-10 NOTE — Telephone Encounter (Signed)
Name: Janet Horn  MRN: [id number] DOB: [birth year]  Patient has been enrolled into the Transportation program. 6/28. JD   Janet Horn DOB: 2001/06/07 MRN: [id number]   RIDER WAIVER AND RELEASE OF LIABILITY  For purposes of improving physical access to our facilities, [facility] is pleased to partner with third parties to provide [facility] patients or other authorized individuals the option of convenient, on-demand ground transportation services (the Chiropractor") through use of the technology service that enables users to request on-demand ground transportation from independent third-party providers.  By opting to use and accept these [employer], I, the undersigned, hereby agree on behalf of myself, and on behalf of any minor child using the Science writer for whom I am the parent or legal guardian, as follows:  Science writer provided to me are provided by independent third-party transportation providers who are not [employer] or employees and who are unaffiliated with [employer]. [place] is neither a transportation carrier nor a common or public carrier. [facility] has no control over the quality or safety of the transportation that occurs as a result of the [employer]. [place] [facility] cannot guarantee that any third-party transportation provider will complete any arranged transportation service. [facility] makes no representation, warranty, or guarantee regarding the reliability, timeliness, quality, safety, suitability, or availability of any of the Transport Services or that they will be error free. I fully understand that traveling by vehicle involves risks and dangers of serious bodily injury, including permanent disability, paralysis, and death. I agree, on behalf of myself and on behalf of any minor child using the Transport Services for whom I am the parent or legal guardian, that the entire risk arising out of my use of the  [employer] remains solely with me, to the maximum extent permitted under applicable law. The [employer] are provided "as is" and "as available." [place] disclaims all representations and warranties, express, implied or statutory, not expressly set out in these terms, including the implied warranties of merchantability and fitness for a particular purpose. I hereby waive and release [place], its agents, employees, officers, directors, representatives, insurers, attorneys, assigns, successors, subsidiaries, and affiliates from any and all past, present, or future claims, demands, liabilities, actions, causes of action, or suits of any kind directly or indirectly arising from acceptance and use of the [employer]. I further waive and release [place] and its affiliates from all present and future liability and responsibility for any injury or death to persons or damages to property caused by or related to the use of the [employer]. I have read this Waiver and Release of Liability, and I understand the terms used in it and their legal significance. This Waiver is freely and voluntarily given with the understanding that my right (as well as the right of any minor child for whom I am the parent or legal guardian using the [employer]) to legal recourse against [place] in connection with the [employer] is knowingly surrendered in return for use of these services.   I attest that I read the consent document to Janet Horn, gave Ms. Windsor the opportunity to ask questions and answered the questions asked (if any). I affirm that Janet Horn then provided consent for she's participation in this program.     Hessie Knows

## 2021-04-11 ENCOUNTER — Ambulatory Visit (INDEPENDENT_AMBULATORY_CARE_PROVIDER_SITE_OTHER): Payer: Medicaid Other

## 2021-04-11 ENCOUNTER — Telehealth (HOSPITAL_COMMUNITY): Payer: Self-pay | Admitting: Psychiatry

## 2021-04-11 ENCOUNTER — Other Ambulatory Visit: Payer: Self-pay

## 2021-04-11 VITALS — Wt 159.7 lb

## 2021-04-11 DIAGNOSIS — A749 Chlamydial infection, unspecified: Secondary | ICD-10-CM

## 2021-04-11 NOTE — Progress Notes (Signed)
Here today for test of cure of Chlamydia. I explained to pt this is too soon for test of cure. Pt was prescribed treatment on 04/05/21. Pt to be rescheduled for test of cure visit in 3-4 weeks. Pt requests mother come into nurse visit for explanation. Discussion repeated with mother in the room.   Pt also mentions being out of all mental health medications. States that she and her mother have tried to call psychiatrist office and been unable to connect with anyone. Pt reports appt mid July. Encouraged pt to physically go to their office as it is just across the street. Pt and mother agreeable. I explained that if they are unable to help her, she can present to urgent care and be seen immediately.   Fleet Contras RN 04/11/21

## 2021-04-11 NOTE — Telephone Encounter (Signed)
Patient came by stating she has lost all of her medications due to moving around from place to place.  Patient reports having bouts of intense anxiety "fits" that are becoming worrisome.  Please call pt at her new phone number [phone].

## 2021-04-11 NOTE — Telephone Encounter (Signed)
I called the pt at the new number provided but there was no response.

## 2021-04-13 NOTE — Progress Notes (Signed)
Patient was assessed and managed by nursing staff during this encounter. I have reviewed the chart and agree with the documentation and plan.   Edd Arbour, MSN, CNM, [facility] 04/13/21 11:13 AM

## 2021-04-17 ENCOUNTER — Telehealth (HOSPITAL_COMMUNITY): Payer: Self-pay | Admitting: *Deleted

## 2021-04-17 NOTE — Telephone Encounter (Signed)
Received a request from [facility] pharmacy for her Trazodone. She had been using [facility] and they reported to me she last filled her meds there on 03/01/21. States sometime after that they rxs were transferred. [facility] now listed as her preferred pharmacy. Spoke with pharm staff and they had filled the trazodone and sertraline on 03/21/21 which would have been early if filled at [facility] on [facility]. She has an appt here on 04/27/21 and can address her meds and rxs at that time. She should have enough of her meds till appt. Difficult to determine when Renaee Munda states the above are the only two meds they received but [facility]

## 2021-04-25 ENCOUNTER — Telehealth (HOSPITAL_COMMUNITY): Payer: Self-pay | Admitting: Psychiatry

## 2021-04-25 NOTE — Telephone Encounter (Signed)
Patient calling from undisclosed location to report homelessness & loss of transportation. Pt will try to keep virtual appt with Doyne Keel this Friday 04/27/21 but request we inform provider.  Pt also asking for assistance in obtaining housing. Actor. Contacted Patent examiner requesting their follow-up with patient.

## 2021-04-27 ENCOUNTER — Other Ambulatory Visit: Payer: Self-pay

## 2021-04-27 ENCOUNTER — Encounter (HOSPITAL_COMMUNITY): Payer: Self-pay | Admitting: Psychiatry

## 2021-04-27 ENCOUNTER — Telehealth (INDEPENDENT_AMBULATORY_CARE_PROVIDER_SITE_OTHER): Payer: Medicaid Other | Admitting: Psychiatry

## 2021-04-27 DIAGNOSIS — F4310 Post-traumatic stress disorder, unspecified: Secondary | ICD-10-CM | POA: Diagnosis not present

## 2021-04-27 DIAGNOSIS — F3130 Bipolar disorder, current episode depressed, mild or moderate severity, unspecified: Secondary | ICD-10-CM

## 2021-04-27 MED ORDER — BUSPIRONE HCL 10 MG PO TABS: 10.0000 mg | ORAL_TABLET | Freq: Three times a day (TID) | ORAL | 2 refills | Status: DC

## 2021-04-27 MED ORDER — ARIPIPRAZOLE 10 MG PO TABS: 10.0000 mg | ORAL_TABLET | Freq: Every day | ORAL | 2 refills | Status: DC

## 2021-04-27 MED ORDER — SERTRALINE HCL 100 MG PO TABS: 100.0000 mg | ORAL_TABLET | Freq: Every day | ORAL | 2 refills | Status: DC

## 2021-04-27 MED ORDER — TRAZODONE HCL 50 MG PO TABS: 50.0000 mg | ORAL_TABLET | Freq: Every day | ORAL | 2 refills | Status: DC
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
## 2021-05-02 ENCOUNTER — Ambulatory Visit: Payer: Medicaid Other

## 2021-05-03 ENCOUNTER — Telehealth (HOSPITAL_COMMUNITY): Payer: Self-pay | Admitting: Licensed Clinical Social Worker

## 2021-05-03 NOTE — Telephone Encounter (Signed)
Patient calling requesting return call from her therapist. Confirmed pt reachable on mobile phone [phone].

## 2021-05-18 ENCOUNTER — Emergency Department (HOSPITAL_COMMUNITY): Payer: Medicaid Other

## 2021-05-18 ENCOUNTER — Emergency Department (HOSPITAL_COMMUNITY)
Admission: EM | Admit: 2021-05-18 | Discharge: 2021-05-19 | Disposition: A | Discharge status: Other Acute Inpatient Hospital | Payer: Medicaid Other | Attending: Emergency Medicine | Admitting: Emergency Medicine

## 2021-05-18 ENCOUNTER — Encounter (HOSPITAL_COMMUNITY): Payer: Self-pay | Admitting: Emergency Medicine

## 2021-05-18 DIAGNOSIS — Z20822 Contact with and (suspected) exposure to covid-19: Secondary | ICD-10-CM | POA: Diagnosis not present

## 2021-05-18 DIAGNOSIS — F332 Major depressive disorder, recurrent severe without psychotic features: Secondary | ICD-10-CM | POA: Diagnosis not present

## 2021-05-18 DIAGNOSIS — F4329 Adjustment disorder with other symptoms: Secondary | ICD-10-CM | POA: Diagnosis not present

## 2021-05-18 DIAGNOSIS — R0781 Pleurodynia: Secondary | ICD-10-CM

## 2021-05-18 DIAGNOSIS — R45851 Suicidal ideations: Secondary | ICD-10-CM | POA: Diagnosis not present

## 2021-05-18 DIAGNOSIS — F4323 Adjustment disorder with mixed anxiety and depressed mood: Secondary | ICD-10-CM | POA: Diagnosis not present

## 2021-05-18 DIAGNOSIS — Y900 Blood alcohol level of less than 20 mg/100 ml: Secondary | ICD-10-CM | POA: Insufficient documentation

## 2021-05-18 LAB — CBC WITH DIFFERENTIAL/PLATELET
Abs Immature Granulocytes: 0.01 10*3/uL (ref 0.00–0.07)
Basophils Absolute: 0 10*3/uL (ref 0.0–0.1)
Basophils Relative: 0 %
Eosinophils Absolute: 0.1 10*3/uL (ref 0.0–0.5)
Eosinophils Relative: 2 %
HCT: 40.2 % (ref 36.0–46.0)
Hemoglobin: 12.8 g/dL (ref 12.0–15.0)
Immature Granulocytes: 0 %
Lymphocytes Relative: 28 %
Lymphs Abs: 2.1 10*3/uL (ref 0.7–4.0)
MCH: 24.6 pg — ABNORMAL LOW (ref 26.0–34.0)
MCHC: 31.8 g/dL (ref 30.0–36.0)
MCV: 77.2 fL — ABNORMAL LOW (ref 80.0–100.0)
Monocytes Absolute: 0.5 10*3/uL (ref 0.1–1.0)
Monocytes Relative: 7 %
Neutro Abs: 4.5 10*3/uL (ref 1.7–7.7)
Neutrophils Relative %: 63 %
Platelets: 328 10*3/uL (ref 150–400)
RBC: 5.21 MIL/uL — ABNORMAL HIGH (ref 3.87–5.11)
RDW: 13.1 % (ref 11.5–15.5)
WBC: 7.3 10*3/uL (ref 4.0–10.5)
nRBC: 0 % (ref 0.0–0.2)

## 2021-05-18 LAB — URINALYSIS, ROUTINE W REFLEX MICROSCOPIC
Bilirubin Urine: NEGATIVE
Glucose, UA: NEGATIVE mg/dL
Hgb urine dipstick: NEGATIVE
Ketones, ur: NEGATIVE mg/dL
Nitrite: NEGATIVE
Protein, ur: NEGATIVE mg/dL
Specific Gravity, Urine: 1.023 (ref 1.005–1.030)
pH: 7 (ref 5.0–8.0)

## 2021-05-18 LAB — RAPID URINE DRUG SCREEN, HOSP PERFORMED
Amphetamines: NOT DETECTED
Barbiturates: NOT DETECTED
Benzodiazepines: NOT DETECTED
Cocaine: NOT DETECTED
Opiates: NOT DETECTED
Tetrahydrocannabinol: NOT DETECTED

## 2021-05-18 NOTE — ED Notes (Signed)
Pt has been changed into burgundy scrubs and personal belongs placed in the cabinet behind nurses station, pt has a purse, cell phone, and clothing.
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
## 2021-05-18 NOTE — ED Triage Notes (Addendum)
Per [employer], pt called crisis line stating she wanted to kill herself. Pt also verbalized to [employer] that she was suicidal. Pt requesting to come to [facility] due to SI. Pt reports in triage her plan is to slit her wrist. SI thoughts began today. Pt also states she has been off her meds and would like to be put back on them while she is here. Also c/o left sided rib pain and would like that assessed.

## 2021-05-19 ENCOUNTER — Other Ambulatory Visit: Payer: Self-pay

## 2021-05-19 ENCOUNTER — Other Ambulatory Visit (HOSPITAL_COMMUNITY)
Admission: EM | Admit: 2021-05-19 | Discharge: 2021-05-21 | Disposition: A | Discharge status: Home / Self Care | Payer: Medicaid Other | Source: Home / Self Care | Admitting: Behavioral Health

## 2021-05-19 DIAGNOSIS — Z638 Other specified problems related to primary support group: Secondary | ICD-10-CM | POA: Insufficient documentation

## 2021-05-19 DIAGNOSIS — F4310 Post-traumatic stress disorder, unspecified: Secondary | ICD-10-CM | POA: Insufficient documentation

## 2021-05-19 DIAGNOSIS — R0781 Pleurodynia: Secondary | ICD-10-CM | POA: Diagnosis not present

## 2021-05-19 DIAGNOSIS — F4329 Adjustment disorder with other symptoms: Secondary | ICD-10-CM | POA: Diagnosis present

## 2021-05-19 DIAGNOSIS — F4323 Adjustment disorder with mixed anxiety and depressed mood: Secondary | ICD-10-CM | POA: Diagnosis not present

## 2021-05-19 DIAGNOSIS — Z62820 Parent-biological child conflict: Secondary | ICD-10-CM | POA: Insufficient documentation

## 2021-05-19 DIAGNOSIS — F3130 Bipolar disorder, current episode depressed, mild or moderate severity, unspecified: Secondary | ICD-10-CM | POA: Insufficient documentation

## 2021-05-19 DIAGNOSIS — F603 Borderline personality disorder: Secondary | ICD-10-CM | POA: Insufficient documentation

## 2021-05-19 DIAGNOSIS — Z20822 Contact with and (suspected) exposure to covid-19: Secondary | ICD-10-CM | POA: Diagnosis not present

## 2021-05-19 DIAGNOSIS — Z5901 Sheltered homelessness: Secondary | ICD-10-CM | POA: Insufficient documentation

## 2021-05-19 DIAGNOSIS — Z62810 Personal history of physical and sexual abuse in childhood: Secondary | ICD-10-CM | POA: Insufficient documentation

## 2021-05-19 DIAGNOSIS — F319 Bipolar disorder, unspecified: Secondary | ICD-10-CM | POA: Diagnosis present

## 2021-05-19 DIAGNOSIS — R45851 Suicidal ideations: Secondary | ICD-10-CM | POA: Insufficient documentation

## 2021-05-19 DIAGNOSIS — Z6229 Other upbringing away from parents: Secondary | ICD-10-CM | POA: Insufficient documentation

## 2021-05-19 DIAGNOSIS — Z79899 Other long term (current) drug therapy: Secondary | ICD-10-CM | POA: Insufficient documentation

## 2021-05-19 LAB — HEMOGLOBIN A1C
Hgb A1c MFr Bld: 5.7 % — ABNORMAL HIGH (ref 4.8–5.6)
Mean Plasma Glucose: 116.89 mg/dL

## 2021-05-19 LAB — COMPREHENSIVE METABOLIC PANEL
ALT: 20 U/L (ref 0–44)
AST: 15 U/L (ref 15–41)
Albumin: 4.4 g/dL (ref 3.5–5.0)
Alkaline Phosphatase: 50 U/L (ref 38–126)
Anion gap: 8 (ref 5–15)
BUN: 12 mg/dL (ref 6–20)
CO2: 24 mmol/L (ref 22–32)
Calcium: 9.3 mg/dL (ref 8.9–10.3)
Chloride: 108 mmol/L (ref 98–111)
Creatinine, Ser: 0.81 mg/dL (ref 0.44–1.00)
GFR, Estimated: >60 mL/min (ref >60)
Glucose, Bld: 108 mg/dL — ABNORMAL HIGH (ref 70–99)
Potassium: 3.5 mmol/L (ref 3.5–5.1)
Sodium: 140 mmol/L (ref 135–145)
Total Bilirubin: 0.7 mg/dL (ref 0.3–1.2)
Total Protein: 7.1 g/dL (ref 6.5–8.1)

## 2021-05-19 LAB — LIPID PANEL
Cholesterol: 122 mg/dL (ref 0–200)
HDL: 43 mg/dL (ref >40)
LDL Cholesterol: 63 mg/dL (ref 0–99)
Total CHOL/HDL Ratio: 2.8 RATIO
Triglycerides: 79 mg/dL (ref <150)
VLDL: 16 mg/dL (ref 0–40)

## 2021-05-19 LAB — TSH: TSH: 1.005 u[IU]/mL (ref 0.350–4.500)

## 2021-05-19 LAB — RESP PANEL BY RT-PCR (FLU A&B, COVID) ARPGX2
Influenza A by PCR: NEGATIVE
Influenza B by PCR: NEGATIVE
SARS Coronavirus 2 by RT PCR: NEGATIVE

## 2021-05-19 LAB — ETHANOL: Alcohol, Ethyl (B): <10 mg/dL (ref <10)

## 2021-05-19 LAB — I-STAT BETA HCG BLOOD, ED (MC, WL, AP ONLY): I-stat hCG, quantitative: <5 m[IU]/mL (ref <5)

## 2021-05-19 MED ORDER — ACETAMINOPHEN 325 MG PO TABS: 650.0000 mg | ORAL_TABLET | Freq: Four times a day (QID) | ORAL | Status: DC | PRN

## 2021-05-19 MED ORDER — ARIPIPRAZOLE 10 MG PO TABS
10.0000 mg | ORAL_TABLET | Freq: Every day | ORAL | Status: DC
  Administered 2021-05-19: 10 mg via ORAL
  Filled 2021-05-19: qty 1

## 2021-05-19 MED ORDER — MAGNESIUM HYDROXIDE 400 MG/5ML PO SUSP: 30.0000 mL | Freq: Every day | ORAL | Status: DC | PRN

## 2021-05-19 MED ORDER — SERTRALINE HCL 100 MG PO TABS
100.0000 mg | ORAL_TABLET | Freq: Every day | ORAL | Status: DC
  Administered 2021-05-20 – 2021-05-21 (×2): 100 mg via ORAL
  Filled 2021-05-19 (×2): qty 1

## 2021-05-19 MED ORDER — BUSPIRONE HCL 10 MG PO TABS
10.0000 mg | ORAL_TABLET | Freq: Three times a day (TID) | ORAL | Status: DC
  Administered 2021-05-19: 10 mg via ORAL
  Filled 2021-05-19: qty 1

## 2021-05-19 MED ORDER — TRAZODONE HCL 100 MG PO TABS: 50.0000 mg | ORAL_TABLET | Freq: Every day | ORAL | Status: DC

## 2021-05-19 MED ORDER — ELVITEG-COBIC-EMTRICIT-TENOFAF 150-150-200-10 MG PO TABS: 1.0000 | ORAL_TABLET | Freq: Every day | ORAL | Status: DC

## 2021-05-19 MED ORDER — TRAZODONE HCL 50 MG PO TABS
50.0000 mg | ORAL_TABLET | Freq: Every day | ORAL | Status: DC
Start: 2021-05-19 — End: 2021-05-21
  Filled 2021-05-19 (×2): qty 1

## 2021-05-19 MED ORDER — BUSPIRONE HCL 5 MG PO TABS
10.0000 mg | ORAL_TABLET | Freq: Three times a day (TID) | ORAL | Status: DC
  Administered 2021-05-19: 10 mg via ORAL
  Filled 2021-05-19 (×2): qty 2

## 2021-05-19 MED ORDER — SERTRALINE HCL 50 MG PO TABS
100.0000 mg | ORAL_TABLET | Freq: Every day | ORAL | Status: DC
  Administered 2021-05-19: 100 mg via ORAL
  Filled 2021-05-19: qty 2

## 2021-05-19 MED ORDER — ARIPIPRAZOLE 10 MG PO TABS
10.0000 mg | ORAL_TABLET | Freq: Every day | ORAL | Status: DC
  Administered 2021-05-20 – 2021-05-21 (×2): 10 mg via ORAL
  Filled 2021-05-19 (×2): qty 1

## 2021-05-19 MED ORDER — ALUM & MAG HYDROXIDE-SIMETH 200-200-20 MG/5ML PO SUSP: 30.0000 mL | ORAL | Status: DC | PRN

## 2021-05-19 NOTE — ED Notes (Addendum)
Pt refused Buspar and Trazodone. Pt states "3 times a day is too much" regarding Buspar and reports this medication is making her stomach upset. Roselyn Bering, NP notified. Pt denies any immediate needs at this time.

## 2021-05-19 NOTE — ED Notes (Signed)
GIVEN FOOD 

## 2021-05-19 NOTE — Group Therapy Note (Signed)
Personal Development Wednesday - Personal Development (Establishing Goals/Identifying Values)  Date: 05/19/21  Type of Therapy/Therapeutic Modalities: Group, Solution-Focused, CBT, Motivational Interviewing   Participation Level: Did Not Attend  Objective: To assist patients in identifying personal values that influence the goals they want to accomplish throughout their lives. Facilitators will guide discussion surrounding the framework of cognitive behavioral therapy - the concept our thoughts, feelings and behavioral responses are connected. Patients will reflect on how their values and goals will motivate them to a road of recovery.   Therapeutic Goals:  1. Patient will review the cognitive triangle to understand how thoughts, influence feelings and our feelings influence our behavioral responses.  2. Patient will identify personal values and discuss how they reflect in the patient's life currently.  3. Patient will identify current goals and how they can develop a strategy for identifying solutions.  4. Patient will participate in group discussion to reflect and share experiences for additional support.   Summary of Patient's Progress: Pt did not attend

## 2021-05-19 NOTE — ED Notes (Signed)
Pt asleep in bed. Respirations even and unlabored. Will continue to monitor for safety. ?

## 2021-05-19 NOTE — ED Notes (Signed)
Pt resting in bed. A&O x4, calm and cooperative. Denies SI/HI/AVH. No signs of acute distress noted. Will continue to monitor for safety.  
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
## 2021-05-19 NOTE — ED Provider Notes (Signed)
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 ug/mL at 12 hours after ingestion are often associated with  toxic reactions.  Performed at [facility], [address], [place], [place] [address]    WBC 12/05/2020 6.7  4.0 - 10.5  K/uL Final   RBC 12/05/2020 5.32 (A) 3.87 - 5.11 MIL/uL Final   Hemoglobin 12/05/2020 13.0  12.0 - 15.0 g/dL Final   HCT 19/14/7829 40.3  36.0 - 46.0 % Final   MCV 12/05/2020 75.8 (A) 80.0 - 100.0 fL Final   MCH 12/05/2020 24.4 (A) 26.0 - 34.0 pg Final   MCHC 12/05/2020 32.3  30.0 - 36.0 g/dL Final   RDW 56/21/3086 13.2  11.5 - 15.5 % Final   Platelets 12/05/2020 316  150 - 400 K/uL Final   nRBC 12/05/2020 0.0  0.0 - 0.2 % Final   Performed at [facility], [address], [place], [place] [address]   Opiates 12/05/2020 NONE DETECTED  NONE DETECTED Final   Cocaine 12/05/2020 NONE DETECTED  NONE DETECTED Final   Benzodiazepines 12/05/2020 NONE DETECTED  NONE DETECTED Final   Amphetamines 12/05/2020 NONE DETECTED  NONE DETECTED Final   Tetrahydrocannabinol 12/05/2020 POSITIVE (A) NONE DETECTED Final   Barbiturates 12/05/2020 NONE DETECTED  NONE DETECTED Final   Comment: (NOTE) DRUG SCREEN FOR MEDICAL PURPOSES ONLY.  IF CONFIRMATION IS NEEDED FOR ANY PURPOSE, NOTIFY LAB WITHIN 5 DAYS.  LOWEST DETECTABLE LIMITS FOR URINE DRUG SCREEN Drug Class                     Cutoff (ng/mL) Amphetamine and metabolites    1000 Barbiturate and metabolites    200 Benzodiazepine                 200 Tricyclics and metabolites     300 Opiates and metabolites        300 Cocaine and metabolites        300 THC                            50 Performed at [facility], [address] [place], [place] [address]    I-stat hCG, quantitative 12/05/2020 <5.0  <5 mIU/mL Final   Comment 3 12/05/2020          Final   Comment:   GEST. AGE      CONC.  (mIU/mL)   <=1 WEEK        5 - 50     2 WEEKS       50 - 500     3 WEEKS       100 - 10,000     4 WEEKS     1,000 - 30,000        FEMALE AND NON-PREGNANT FEMALE:     LESS THAN 5 mIU/mL    SARS Coronavirus 2 by RT PCR 12/05/2020 NEGATIVE  NEGATIVE Final   Comment: (NOTE) SARS-CoV-2 target nucleic acids are NOT  DETECTED.  The SARS-CoV-2 RNA is generally detectable in upper respiratory specimens during the acute phase of infection. The lowest concentration of SARS-CoV-2 viral copies this assay can detect is 138 copies/mL. A negative result does not preclude SARS-Cov-2 infection and should not be used as the sole basis for treatment or other patient  management decisions. A negative result may occur with  improper specimen collection/handling, submission of specimen other than nasopharyngeal swab, presence of viral mutation(s) within the areas targeted by this assay, and inadequate number of viral copies(<138 copies/mL). A negative result must be combined with clinical observations, patient history, and epidemiological information. The expected result is Negative.  Fact Sheet for Patients:  BloggerCourse.com  Fact Sheet for Healthcare Providers:  SeriousBroker.it  This test is no                          t yet approved or cleared by the [place] FDA and  has been authorized for detection and/or diagnosis of SARS-CoV-2 by FDA under an Emergency Use Authorization (EUA). This EUA will remain  in effect (meaning this test can be used) for the duration of the COVID-19 declaration under Section 564(b)(1) of the Act, 21 U.S.C.section 360bbb-3(b)(1), unless the authorization is terminated  or revoked sooner.       Influenza A by PCR 12/05/2020 NEGATIVE  NEGATIVE Final   Influenza B by PCR 12/05/2020 NEGATIVE  NEGATIVE Final   Comment: (NOTE) The Xpert Xpress SARS-CoV-2/FLU/RSV plus assay is intended as an aid in the diagnosis of influenza from Nasopharyngeal swab specimens and should not be used as a sole basis for treatment. Nasal washings and aspirates are unacceptable for Xpert Xpress SARS-CoV-2/FLU/RSV testing.  Fact Sheet for Patients: BloggerCourse.com  Fact Sheet for Healthcare  Providers: SeriousBroker.it  This test is not yet approved or cleared by the [place] FDA and has been authorized for detection and/or diagnosis of SARS-CoV-2 by FDA under an Emergency Use Authorization (EUA). This EUA will remain in effect (meaning this test can be used) for the duration of the COVID-19 declaration under Section 564(b)(1) of the Act, 21 U.S.C. section 360bbb-3(b)(1), unless the authorization is terminated or revoked.  Performed at [facility], [address] [place], [place] [address]     Allergies: Nitrous oxide  PTA Medications: (Not in a hospital admission)   Medical Decision Making  Admit to [facility] for safety and mood stabilization  Lab Orders  Hemoglobin A1c  TSH  Lipid panel   EKG  Home meds verified and re-started:  [START ON 05/20/2021] ARIPiprazole  10 mg Oral Daily   busPIRone  10 mg Oral TID   [START ON 05/20/2021] sertraline  100 mg Oral Daily   traZODone  50 mg Oral QHS    Recommendations  Based on my evaluation the patient does not appear to have an emergency medical condition.  Layla Barter, NP 05/19/21  4:15 PM

## 2021-05-19 NOTE — ED Notes (Addendum)
This RN just had a private conversation with pt's mother Angelita Ingles, as well as a non family member named Ebony. They expressed concerns that the pt has an extensive mental health history that contributed to the pt becoming a ward of the state of [place]. In [place], the [place] had was is called a CFT team, including a Clinical SW, as well as a psychiatrist and a guardian ad litem. Mother states that she has concerns for her daughters safety and well being and the mother needs to know what resources are available in this area for her daughter. The mother also states that the pt is not reliable to maintain her own residence or take her medications. Mother states that her daughter is not completely unable of caring for herself, however she "doesn't mentally match her age". Mother also concern that pt is very sexually active and asked if we can test her for HIV.

## 2021-05-19 NOTE — ED Notes (Signed)
Report given to [employer] RN at [facility].

## 2021-05-19 NOTE — Progress Notes (Signed)
Patient admitted to [facility] voluntarily.  Patient denies SI, HI, AVH.  Stated that she  hasn't been suicidal, but has experienced stress and difficulty coping.  Stated sometimes she says things she doesn't mean when she's stressed.  Patient stated she hasn't been taking her medications because they sometimes make her sick.  Patient cooperative with admission process.  Skin check unremarkable. Oriented to the unit and her room. Offered food and fluids which she declined.  Given extra blanket per her request.  Patient lying down, awake.
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
## 2021-05-20 MED ORDER — BUSPIRONE HCL 5 MG PO TABS
10.0000 mg | ORAL_TABLET | Freq: Two times a day (BID) | ORAL | Status: DC
  Administered 2021-05-21: 10 mg via ORAL
  Filled 2021-05-20 (×2): qty 2

## 2021-05-20 NOTE — ED Notes (Signed)
Pt asleep in bed. Respirations even and unlabored. Will continue to monitor for safety. ?

## 2021-05-20 NOTE — ED Notes (Signed)
RN went to pt's room to notify that it is time for bedtime medications. Pt refused Trazodone. Pt denies any immediate needs. Will continue to monitor for safety.

## 2021-05-20 NOTE — Group Therapy Note (Addendum)
Patient cooperative during group. Passionate about her goal and has a plan on how she will accomplish her goal.

## 2021-05-20 NOTE — ED Notes (Addendum)
Pt came up to nurse's station stating she doesn't want to take Buspar tonight because it is making her stomach hurt. Cecilio Asper, NP made aware.

## 2021-05-20 NOTE — ED Provider Notes (Signed)
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 NONE DETECTED  NONE DETECTED Final   Tetrahydrocannabinol 12/05/2020 POSITIVE (A) NONE DETECTED Final   Barbiturates 12/05/2020 NONE DETECTED  NONE DETECTED Final   Comment: (NOTE) DRUG SCREEN FOR MEDICAL PURPOSES ONLY.  IF CONFIRMATION IS NEEDED FOR ANY PURPOSE, NOTIFY  LAB WITHIN 5 DAYS.  LOWEST DETECTABLE LIMITS FOR URINE DRUG SCREEN Drug Class                     Cutoff (ng/mL) Amphetamine and metabolites    1000 Barbiturate and metabolites    200 Benzodiazepine                 200 Tricyclics and metabolites     300 Opiates and metabolites        300 Cocaine and metabolites        300 THC                            50 Performed at [facility], [address] [place], [place] [address]    I-stat hCG, quantitative 12/05/2020 <5.0  <5 mIU/mL Final   Comment 3 12/05/2020          Final   Comment:   GEST. AGE      CONC.  (mIU/mL)   <=1 WEEK        5 - 50     2 WEEKS       50 - 500     3 WEEKS       100 - 10,000     4 WEEKS     1,000 - 30,000        FEMALE AND NON-PREGNANT FEMALE:     LESS THAN 5 mIU/mL    SARS Coronavirus 2 by RT PCR 12/05/2020 NEGATIVE  NEGATIVE Final   Comment: (NOTE) SARS-CoV-2 target nucleic acids are NOT DETECTED.  The SARS-CoV-2 RNA is generally detectable in upper respiratory specimens during the acute phase of infection. The lowest concentration of SARS-CoV-2 viral copies this assay can detect is 138 copies/mL. A negative result does not preclude SARS-Cov-2 infection and should not be used as the sole basis for treatment  or other patient management decisions. A negative result may occur with  improper specimen collection/handling, submission of specimen other than nasopharyngeal swab, presence of viral mutation(s) within the areas targeted by this assay, and inadequate number of viral copies(<138 copies/mL). A negative result must be combined with clinical observations, patient history, and epidemiological information. The expected result is Negative.  Fact Sheet for Patients:  BloggerCourse.com  Fact Sheet for Healthcare Providers:  SeriousBroker.it  This test is no                          t yet approved or cleared by the [place] FDA  and  has been authorized for detection and/or diagnosis of SARS-CoV-2 by FDA under an Emergency Use Authorization (EUA). This EUA will remain  in effect (meaning this test can be used) for the duration of the COVID-19 declaration under Section 564(b)(1) of the Act, 21 U.S.C.section 360bbb-3(b)(1), unless the authorization is terminated  or revoked sooner.       Influenza A by PCR 12/05/2020 NEGATIVE  NEGATIVE Final   Influenza B by PCR 12/05/2020 NEGATIVE  NEGATIVE Final   Comment: (NOTE) The Xpert Xpress SARS-CoV-2/FLU/RSV plus assay is intended as an aid in the diagnosis of influenza from Nasopharyngeal swab specimens and should not be used as a sole basis for treatment. Nasal washings and aspirates are unacceptable for Xpert Xpress SARS-CoV-2/FLU/RSV testing.  Fact Sheet for Patients: BloggerCourse.com  Fact Sheet for Healthcare Providers: SeriousBroker.it  This test is not yet approved or cleared by the [place] FDA and has been authorized for detection and/or diagnosis of SARS-CoV-2 by FDA under an Emergency Use Authorization (EUA). This EUA will remain in effect (meaning this test can be used) for the duration of the COVID-19 declaration under Section 564(b)(1) of the Act, 21 U.S.C. section 360bbb-3(b)(1), unless the authorization is terminated or revoked.  Performed at [facility], [address] [place], [place] [address]     Blood Alcohol level:  Lab Results  Component Value Date   ETH <10 05/18/2021   ETH <10 12/05/2020    Metabolic Disorder Labs: Lab Results  Component Value Date   HGBA1C 5.7 (H) 05/18/2021   MPG 116.89 05/18/2021   MPG 108.28 09/03/2020   No results found for: PROLACTIN Lab Results  Component Value Date   CHOL 122 05/18/2021   TRIG 79 05/18/2021   HDL 43 05/18/2021   CHOLHDL 2.8 05/18/2021   VLDL 16 05/18/2021   LDLCALC 63 05/18/2021   LDLCALC 73  09/03/2020    Therapeutic Lab Levels: No results found for: LITHIUM No results found for: VALPROATE No components found for:  CBMZ  Physical Findings   AIMS    Flowsheet Row ED to Hosp-[facility] ([facility] Admission (Discharged) from [facility]16[facility]  AIMS Total Score 0 0      AUDIT    Flowsheet Row Ad[facility] (Discharged) [facility]  Alcohol Use Disorder Identification Test Final Score (AUDIT) 0      GAD-7    Flowsheet Row Video Visit from 04/27/2021 in [facility] Clinical Support from 04/11/2021 in Center for [facility] at [facility] for Women Office Visit from 04/03/2021 in [facility]  Total GAD-7 Score 7 0 0      PHQ2-9  Flowsheet Row Video Visit from 04/27/2021 in [facility] Clinical Support from 04/11/2021 in Center for [facility] at [facility] for Women Office Visit from 04/03/2021 in [facility] at [facility] OP Visit from 10[facility] from 09/19/2020 in [facility]  PHQ-2 Total Score 1 0 0 2 3  PHQ-9 Total Score 4 0 0 9 9      Flowsheet Row ED from 05/19/2021 in [facility] ED from 05/18/2021 in [facility] from 03/14/2021 in [facility] EMERGENCY DEPARTMENT  C-SSRS RISK CATEGORY No Risk High Risk No Risk        Musculoskeletal  Strength & Muscle Tone: within normal limits Gait & Station: normal Patient leans: N/A  Psychiatric Specialty Exam  Presentation  General Appearance: Appropriate for Environment  Eye Contact:Fair  Speech:Clear and Coherent  Speech  Volume:Normal  Handedness: No data recorded  Mood and Affect  Mood:Euthymic  Affect:Appropriate   Thought Process  Thought Processes:Coherent; Goal Directed  Descriptions of Associations:Intact  Orientation:Full (Time, Place and Person)  Thought Content:WDL  Diagnosis of Schizophrenia or Schizoaffective disorder in past: No  Duration of Psychotic Symptoms: Greater than six months   Hallucinations:Hallucinations: None  Ideas of Reference:None  Suicidal Thoughts:Suicidal Thoughts: No  Homicidal Thoughts:Homicidal Thoughts: No   Sensorium  Memory:Immediate Fair; Remote Fair; Recent Fair  Judgment:Fair  Insight:Fair   Executive Functions  Concentration:Fair  Attention Span:Fair  Recall:Fair  Fund of Knowledge:Fair  Language:Fair   Psychomotor Activity  Psychomotor Activity:Psychomotor Activity: Normal   Assets  Assets:Communication Skills; Desire for Improvement; Leisure Time; Physical Health; Social Support   Sleep  Sleep:Sleep: Fair   Nutritional Assessment (For OBS and FBC admissions only) Has the patient had a weight loss or gain of 10 pounds or more in the last 3 months?: No Has the patient had a decrease in food intake/or appetite?: No Does the patient have dental problems?: No Does the patient have eating habits or behaviors that may be indicators of an eating disorder including binging or inducing vomiting?: No Has the patient recently lost weight without trying?: No Has the patient been eating poorly because of a decreased appetite?: No Malnutrition Screening Tool Score: 0    Physical Exam  Physical Exam HENT:     Head: Normocephalic.  Eyes:     Conjunctiva/sclera: Conjunctivae normal.  Cardiovascular:     Rate and Rhythm: Normal rate.  Pulmonary:     Effort: Pulmonary effort is normal.  Musculoskeletal:        General: Normal range of motion.     Cervical back: Normal range of motion.  Neurological:     Mental Status: She  is alert.   Review of Systems  Constitutional: Negative.   HENT: Negative.    Eyes: Negative.   Respiratory: Negative.    Cardiovascular: Negative.   Skin: Negative.   Neurological: Negative.   Endo/Heme/Allergies: Negative.   Psychiatric/Behavioral:  Positive for depression. Negative for hallucinations and suicidal ideas. The patient is nervous/anxious.   Blood pressure 111/68, pulse 75, temperature 98.6 F (37 C), temperature source Oral, resp. rate 18, SpO2 97 %. There is no height or weight on file to calculate BMI.  Treatment Plan Summary: Daily contact with patient to assess and evaluate symptoms and progress in treatment and Medication management Abilify 10 mg daily po for mood. Buspar 10 mg po TID,  decreased to BID for anxiety. Zoloft 100 mg po daily for depression.  EKG reviewed QTC is 407.  Nikolai Wilczak L, NP 05/20/2021 10:59 AM

## 2021-05-20 NOTE — Group Therapy Note (Signed)
Personal Development Establishing Goals/Identifying Values - Wrap-up  Date: 05/20/21  Type of Therapy/Therapeutic Modalities: Group, Solution-Focused, CBT, Motivational Interviewing   Participation Level: Minimal  Objective: To assist patients in identifying personal values that influence the goals they want to accomplish throughout their lives. Facilitators will guide discussion surrounding the framework of cognitive behavioral therapy - the concept our thoughts, feelings and behavioral responses are connected. Patients will reflect on how their values and goals will motivate them to a road of recovery.     Summary of Patient's Progress: Patients goal was to call their mom. Pt said goal was met.

## 2021-05-20 NOTE — ED Notes (Signed)
Pt coloring in the day room.

## 2021-05-20 NOTE — ED Notes (Addendum)
Pt sitting in dining room, watching TV and appropriately interacting with peers. A&O x4, calm and cooperative. Pt denies SI/HI/AVH. Pt denies any immediate needs. No signs of acute distress noted. Will continue to monitor for safety.

## 2021-05-20 NOTE — ED Notes (Signed)
Patient up for breakfast and using phone to call mother.  Stated mother would bring in clothes for her this afternoon.  Denied SI, HI, AVH.  Stated that her stomach was upset and that's why she didn't want to take the Buspar last night.  Patient stated she thinks TID is too much. Encouraged her to discuss medication regimen with provider this AM.  Continue to monitor for safety.

## 2021-05-20 NOTE — ED Notes (Signed)
Pt eating breakfast 

## 2021-05-20 NOTE — ED Notes (Signed)
Pt eating lunch

## 2021-05-20 NOTE — ED Notes (Signed)
Patient sleeping throughout most of shift.  Up for meals.  Attended group.  Continue to monitor for safety.

## 2021-05-20 NOTE — ED Notes (Signed)
Snack with juice

## 2021-05-20 NOTE — ED Notes (Signed)
Pt eating dinner

## 2021-05-21 MED ORDER — ONDANSETRON HCL 4 MG PO TABS: 4.0000 mg | ORAL_TABLET | Freq: Three times a day (TID) | ORAL | Status: DC | PRN

## 2021-05-21 MED ORDER — ONDANSETRON 4 MG PO TBDP: 4.0000 mg | ORAL_TABLET | Freq: Three times a day (TID) | ORAL | Status: DC | PRN

## 2021-05-21 MED ORDER — BUSPIRONE HCL 10 MG PO TABS: 10.0000 mg | ORAL_TABLET | Freq: Two times a day (BID) | ORAL | 2 refills | Status: DC

## 2021-05-21 MED ORDER — ONDANSETRON 4 MG PO TBDP: 4.0000 mg | ORAL_TABLET | Freq: Three times a day (TID) | ORAL | 0 refills | Status: DC | PRN
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
## 2021-05-21 NOTE — Clinical Social Work Psych Note (Signed)
CSW DISCHARGE NOTE   CSW met with patient for introduction and to discuss discharge planning. Patient reports she feels good and is planning to discharge today. Patient denied having any SI, HI, or AVH at this time.   Patient reports she plans on returning to her extended stay hotel room at discharge. She reports her mother has been in contact with her social Industrial/product designer in [place] to arrange the patient having a new room or a new hotel placement.   Patient reports that she is established with Burt Ek, NP for medication management and Gwyneth Revels for therapy at the [facility] and plans to follow up upon discharge.   Patient has appointments on 05/25/21 and 07/23/21. Patient was also was provided the [facility] walk-in hours in the event she needs to be seen prior to appointment dates.    Patient requested to utilize [employer] to her Extended [employer] on [address], [place] [address].     CSW will continue to follow until discharge.    Radonna Ricker, MSW, LCSW Clinical Education officer, museum ([place]) [facility]

## 2021-05-21 NOTE — ED Notes (Signed)
Pt eating breakfast 
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
## 2021-05-21 NOTE — ED Notes (Signed)
Pt given snack. 

## 2021-05-21 NOTE — ED Notes (Signed)
Pt asleep in bed. Respirations even and unlabored. Will continue to monitor for safety. ?

## 2021-05-25 ENCOUNTER — Telehealth (HOSPITAL_COMMUNITY): Payer: Self-pay | Admitting: Licensed Clinical Social Worker

## 2021-05-25 ENCOUNTER — Ambulatory Visit (HOSPITAL_COMMUNITY): Payer: Medicaid Other | Admitting: Licensed Clinical Social Worker

## 2021-05-25 NOTE — Telephone Encounter (Signed)
LCSW sent text with link for video session per schedule. Pt repeatedly signed on but was not able to sustain connection. LCSW phoned pt who states she cannot get back in at all now. LCSW advised of need to see her for assessment of new pt and agrees to be rescheduled for in person session.

## 2021-06-26 ENCOUNTER — Ambulatory Visit: Payer: Medicaid Other

## 2021-06-28 ENCOUNTER — Other Ambulatory Visit (HOSPITAL_COMMUNITY): Payer: Self-pay | Admitting: Psychiatry

## 2021-06-28 ENCOUNTER — Telehealth (HOSPITAL_COMMUNITY): Payer: Self-pay | Admitting: Psychiatry

## 2021-06-28 DIAGNOSIS — F3130 Bipolar disorder, current episode depressed, mild or moderate severity, unspecified: Secondary | ICD-10-CM

## 2021-06-28 DIAGNOSIS — F4310 Post-traumatic stress disorder, unspecified: Secondary | ICD-10-CM

## 2021-06-28 MED ORDER — BUSPIRONE HCL 10 MG PO TABS: 10.0000 mg | ORAL_TABLET | Freq: Two times a day (BID) | ORAL | 2 refills | Status: DC

## 2021-06-28 MED ORDER — TRAZODONE HCL 50 MG PO TABS: 50.0000 mg | ORAL_TABLET | Freq: Every day | ORAL | 2 refills | Status: DC

## 2021-06-28 MED ORDER — SERTRALINE HCL 100 MG PO TABS: 100.0000 mg | ORAL_TABLET | Freq: Every day | ORAL | 2 refills | Status: DC

## 2021-06-28 MED ORDER — ARIPIPRAZOLE 10 MG PO TABS: 10.0000 mg | ORAL_TABLET | Freq: Every day | ORAL | 2 refills | Status: DC

## 2021-06-28 NOTE — Telephone Encounter (Signed)
Medications refilled and sent to preferred pharmacy.

## 2021-06-28 NOTE — Telephone Encounter (Signed)
Patient contacted office regarding medications. Patient has since moved to [facility] and is [facility] [address] [place] [address]. Writer did verify that patient lives in [place] and explained that she would need to find a provider in her area. Patient was instructed to contact insurance or request a referral from current therapist in her area. Patient was understanding. Writer informed that her future appt for 10/10 with provider will be the last visit to make sure she has another provider in her area to take over her services.

## 2021-07-19 ENCOUNTER — Ambulatory Visit (HOSPITAL_COMMUNITY): Payer: Medicaid Other | Admitting: Clinical

## 2021-07-23 ENCOUNTER — Telehealth (INDEPENDENT_AMBULATORY_CARE_PROVIDER_SITE_OTHER): Payer: Medicaid Other | Admitting: Psychiatry

## 2021-07-23 ENCOUNTER — Other Ambulatory Visit: Payer: Self-pay

## 2021-07-23 ENCOUNTER — Encounter (HOSPITAL_COMMUNITY): Payer: Self-pay | Admitting: Psychiatry

## 2021-07-23 ENCOUNTER — Ambulatory Visit (HOSPITAL_COMMUNITY): Payer: Medicaid Other | Admitting: Licensed Clinical Social Worker

## 2021-07-23 DIAGNOSIS — F3130 Bipolar disorder, current episode depressed, mild or moderate severity, unspecified: Secondary | ICD-10-CM | POA: Diagnosis not present

## 2021-07-23 MED ORDER — ARIPIPRAZOLE 10 MG PO TABS: 10.0000 mg | ORAL_TABLET | Freq: Every day | ORAL | 3 refills | Status: DC
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
## 2021-10-14 NOTE — L&D Delivery Note (Signed)
OB/GYN Faculty Practice Delivery Note  Janet Horn is a 21 y.o. G2P0010 s/p vag del at [redacted]w[redacted]d. She was admitted for SOL; also w GDMA1 in preg.   ROM: 0h 37m with thick MSF fluid GBS Status: neg Maximum Maternal Temperature: 99.2  Labor Progress: Ms Staff was admitted in active labor and progressed to 10cm spont at which she had ROM during exam for thick MSF (possibly had a high leak ~1600?). Pushed <60min.  Delivery Date/Time: September 22nd, 2023 at 2204 Delivery: Called to room and patient was complete and pushing. Head delivered LOA. Nuchal cord present x 1, very loose, reduced prior to delivery. Shoulder and body delivered in usual fashion. Infant with spontaneous cry, placed on mother's abdomen, dried and stimulated. Cord clamped x 2 after 1-minute delay, and cut by mother of patient. Cord blood drawn. Placenta delivered spontaneously with gentle cord traction. Fundus firm with massage and Pitocin. Labia, perineum, vagina, and cervix inspected and found to be intact.   Placenta: spont, intact; to L&D Complications: none Lacerations: none (sm L lab abrasion, no repair) EBL: 106cc Analgesia: epidural  Postpartum Planning [x]  message to sent to schedule follow-up    Infant: boy  APGARs 9/9  3410g (7lb 8.3oz)  Myrtis Ser, CNM  07/05/2022 10:34 PM

## 2021-10-23 ENCOUNTER — Encounter (HOSPITAL_COMMUNITY): Payer: Self-pay | Admitting: Psychiatry

## 2021-10-23 ENCOUNTER — Telehealth (INDEPENDENT_AMBULATORY_CARE_PROVIDER_SITE_OTHER): Payer: Medicaid Other | Admitting: Psychiatry

## 2021-10-23 DIAGNOSIS — F411 Generalized anxiety disorder: Secondary | ICD-10-CM

## 2021-10-23 DIAGNOSIS — F3130 Bipolar disorder, current episode depressed, mild or moderate severity, unspecified: Secondary | ICD-10-CM | POA: Diagnosis not present

## 2021-10-23 MED ORDER — ARIPIPRAZOLE 10 MG PO TABS: 10.0000 mg | ORAL_TABLET | Freq: Every day | ORAL | 4 refills | Status: DC

## 2021-10-23 MED ORDER — SERTRALINE HCL 25 MG PO TABS: 25.0000 mg | ORAL_TABLET | Freq: Every day | ORAL | 4 refills | Status: DC
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
## 2021-11-29 ENCOUNTER — Telehealth (HOSPITAL_COMMUNITY): Payer: Self-pay | Admitting: Psychiatry

## 2021-11-29 NOTE — Telephone Encounter (Signed)
Currently  [redacted] weeks pregnant. Is it ok to take medications prescribed?  Pt says she has her first OB appt in the first week of March.

## 2021-12-03 NOTE — Telephone Encounter (Signed)
Writer attempted to call patient's 3 times without success.  Provider did leave a voicemail informing patient that psychiatric medications in the first trimester are often not recommended.  Provider informed patient that if she had further questions or concerns that she could call or walk into the clinic if needed.  No other concerns at this time.

## 2021-12-09 ENCOUNTER — Encounter: Payer: Self-pay | Admitting: Family Medicine

## 2021-12-12 ENCOUNTER — Telehealth (INDEPENDENT_AMBULATORY_CARE_PROVIDER_SITE_OTHER): Payer: Self-pay

## 2021-12-12 DIAGNOSIS — R011 Cardiac murmur, unspecified: Secondary | ICD-10-CM | POA: Insufficient documentation

## 2021-12-12 DIAGNOSIS — O0990 Supervision of high risk pregnancy, unspecified, unspecified trimester: Secondary | ICD-10-CM | POA: Insufficient documentation

## 2021-12-12 DIAGNOSIS — Z3A00 Weeks of gestation of pregnancy not specified: Secondary | ICD-10-CM

## 2021-12-12 DIAGNOSIS — O99419 Diseases of the circulatory system complicating pregnancy, unspecified trimester: Secondary | ICD-10-CM

## 2021-12-12 DIAGNOSIS — F1991 Other psychoactive substance use, unspecified, in remission: Secondary | ICD-10-CM | POA: Insufficient documentation

## 2021-12-12 NOTE — Progress Notes (Signed)
New OB Intake ? ?I connected with  Janet Horn on 12/12/21 at  1:15 PM EST by MyChart Video Visit and verified that I am speaking with the correct person using two identifiers. Nurse is located at [facility] and pt is located at [employer]. ? ?I discussed the limitations, risks, security and privacy concerns of performing an evaluation and management service by telephone and the availability of in person appointments. I also discussed with the patient that there may be a patient responsible charge related to this service. The patient expressed understanding and agreed to proceed. ? ?I explained I am completing New OB Intake today. We discussed her EDD of 07/09/22 that is based on LMP of 10/02/22. Pt is G2/P0. I reviewed her allergies, medications, Medical/Surgical/OB history, and appropriate screenings. I informed her of [facility] services. Based on history, this is a/an  pregnancy complicated by Heart Murmur  .  ? ?Patient Active Problem List  ? Diagnosis Date Noted  ? Supervision of high risk pregnancy, antepartum 12/12/2021  ? Heart murmur 12/12/2021  ? History of drug use 12/12/2021  ? Adjustment disorder with mixed emotional features 05/19/2021  ? Bipolar 1 disorder (HCC) 05/19/2021  ? Borderline personality disorder (HCC) 02/27/2021  ? Bipolar I disorder, most recent episode depressed (HCC) 09/20/2020  ? Bereavement 09/03/2020  ? 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 PTSD (post-traumatic stress disorder) 11/28/2016  ? Decreased appetite 08/20/2016  ? Insomnia 08/19/2016  ? Severe episode of recurrent major depressive disorder, without psychotic features (HCC) 08/16/2016  ? ? ?Concerns addressed today ? ?Delivery Plans:  ?Plans to deliver at [facility].  ? ?Waterbirth candidate?  ? ?MyChart/Babyscripts ?MyChart access verified. I explained pt will have some visits in office and some virtually. Babyscripts instructions given and order placed. Patient verifies receipt  of registration text/e-mail. Account successfully created and app downloaded. ? ?Blood Pressure Cuff  ?Patient has private insurance; instructed to purchase blood pressure cuff and bring to first prenatal appt. Explained after first prenatal appt pt will check weekly and document in Babyscripts. ? ?Weight scale: Patient does / does not  have weight scale. Weight scale ordered for patient to pick up from [employer].  ? ?Anatomy US ?Explained first scheduled [place] will be around 19 weeks. Anatomy US scheduled for 02/14/22 at 01:45p. Pt notified to arrive at 01:30p. ?Scheduled AFP lab only appointment if CenteringPregnancy pt for same day as anatomy US.  ? ?Labs ?Discussed Avelina Laine genetic screening with patient. Would like both Panorama and Horizon drawn at new OB visit.Also if interested in genetic testing, tell patient she will need AFP 15-21 weeks to complete genetic testing .Routine prenatal labs needed. ? ?Covid Vaccine ?Patient has not covid vaccine.  ? ?Is patient a CenteringPregnancy candidate? Not a candidate  ? ?Is patient a Mom+Baby Combined Care candidate? Not a candidate    ? ?Informed patient of [facility] website  and placed link in her AVS.  ? ?Social Determinants of Health ?Food Insecurity: Patient expresses food insecurity. Food Market information given to patient; explained patient may visit at the end of first OB appointment. ?WIC Referral: Patient is interested in referral to [facility].  ?Transportation: Patient denies transportation needs. ?Childcare: Discussed no children allowed at ultrasound appointments. Offered childcare services; patient declines childcare services at this time. ? ?Send link to Pregnancy Navigators ? ? ?Placed OB Box on problem list and updated ? ?First visit review ?I reviewed new OB appt with pt. I explained  she will have a pelvic exam, ob bloodwork with genetic screening, and PAP smear. Explained pt will be seen by Dr, Adrian Blackwater at first visit; encounter routed to  appropriate provider. Explained that patient will be seen by pregnancy navigator following visit with provider. [facility] information placed in AVS.  ? ?Henrietta Dine, CMA ?12/12/2021  1:55 PM  ?

## 2021-12-12 NOTE — Progress Notes (Signed)
Pt. States Dr. Ronne Binning from [facility] took her off of Abilify & Zoloft due to pregnancy. She has been seeing Dr. Ronne Binning for the last 6 months now she stated for Manic Depression & Bipolar. ?

## 2021-12-12 NOTE — Patient Instructions (Signed)
AREA PEDIATRIC/FAMILY PRACTICE PHYSICIANS ° °[place] ([address]) °[facility] °Chambliss, MD; Eniola, MD; Hale, MD; Hensel, MD; McDiarmid, MD; McIntyer, MD; Eshani Maestre, MD; Walden, MD °[address] [place], [place] [address] °[phone] °Mon-Fri 8:30-12:30, 1:30-5:00 °Providers come to see babies at [facility] °Accepting Medicaid °[facility] °Limited providers who accept newborns: Koirala, MD; Morrow, MD; Wolters, MD °[address], [place], [place] [address] °[phone] °Mon-Fri 8:00-5:30 °Babies seen by providers at [facility] °Does NOT accept Medicaid °Please call early in hospitalization for appointment (limited availability)  °[facility] °Mulberry, MD °[address] [place], [place] [address] °[phone] °Mon, Tue, Thur, Fri 8:30-5:00, Wed 10:00-7:00 (closed 1-2pm) °Babies seen by [facility] providers °Accepting Medicaid °[facility] °Rubin, MD °[address], [place], [place] [address] °[phone] °Mon-Fri 8:30-5:00, Sat 8:30-12:00 °Provider comes to see babies at [facility] °Accepting Medicaid °Must have been referred from current patients or contacted office prior to delivery °[facility] ([facility]) °Brown, MD; Chandler, MD; Ettefagh, MD; Grant, MD; Lester, MD; McCormick, MD; McQueen, MD; Prose, MD; Simha, MD; Stanley, MD; Stryffeler, NP; Tebben, NP °[address], [place], [place] [address] °[phone] °Mon, Tue, Thur, Fri 8:30-5:30, Wed 9:30-5:30, Sat 8:30-12:30 °Babies seen by [facility] providers °Accepting Medicaid °Only accepting infants of first-time parents or siblings of current patients °Hospital discharge coordinator will make follow-up appointment °Jack Amos °[address], [place], [place]  [address] °[phone]   Fax - [phone] °[facility] °[address], [place], [place]  [address] °Phone - [phone]   Fax - [phone] °Shilpa Gosrani °[address], [place], [place]  [address] °[phone] ° °[place] ([address]) °[facility] °Bates, MD; [place], MD; Cooper, Cox, MD; MD; Davis, MD; Dovico, MD; Ettefaugh, MD; Little, MD; Lowe, MD; Keiffer, MD; Melvin, MD; Sumner, MD; Williams, MD °[address], [place], [place] [address] °[phone] °Mon-Fri 8:30-5:00 (extended evenings Mon-Thur as needed), Sat-Sun 10:00-1:00 °Providers come to see babies at [facility] °Accepting Medicaid for families of first-time babies and families with all children in the household age 3 and under. Must register with office prior to making appointment (M-F only). °[facility] °Henson, NP; Knapp, MD; Lalonde, MD; Tysinger, PA °[address] [place], [place] [address] °[phone] °Mon-Fri 8:00-5:00 °Babies seen by providers at [facility] °Does NOT accept Medicaid/Commercial Insurance Only °[facility] ([facility]  °Artis, MD; Barnes, MD; Bratton, MD; Coccaro, MD; Lockett Gardner, MD; Kramer, MD; Marshall, MD; Netherton, MD; Poleto, MD; Skinner, MD °[address] [place], [place] [address] °[phone] °Mon-Fri 8:30-5:30, Sat (Oct.-Mar.) 9:00-1:00 °Babies seen by providers at [facility] °Accepting Medicaid ° °[place] ([address]) °[facility] °Reid, MD; Warner, MD °[address], [place], [place] [address] °[phone] °Mon-Fri 8:30-5:00, Sat 8:30-12:00 °Providers come to see babies at [facility] °Does NOT accept Medicaid °[facility] °Becker, PA; Hagler, MD; Scifres, PA; Sun, MD; Swayne, MD °[address], [place], [place] [address] °[phone] °Mon-Fri 8:00-5:00 °Babies seen by providers at [facility] °Does NOT accept Medicaid °Only accepting babies of parents who  are patients °Please call early in hospitalization for appointment (limited availability) °[facility] °Clark, MD; Frye, MD; Kelleher, MD; Mack, NP; Miller, MD; O'Keller, MD; Patterson, NP; Pudlo, MD; Puzio, MD; Thomas, MD; Tucker, MD; Twiselton, MD °[address], [place], [place] [address] °[phone] °Mon-Fri 8:00-5:00, Sat 9:00-12:00 °Providers come to see babies at [facility] °Does NOT accept Medicaid ° °[place] ([address]) °[facility] °Limited providers accepting new patients: Brake, NP; Wharton, PA °[address], [place], [place] [address] °[phone] °Mon-Fri 8:00-5:00 °Babies seen by providers at [facility] °Does NOT accept Medicaid °Only accepting babies of parents who are patients °Please call early in hospitalization for appointment (limited availability) °[facility] °Gay, MD; Quinlan, MD °[address] [place], [place] [address] °[phone] (press 1 to schedule appointment) °Mon-Fri 8:00-5:00 °Providers come to see babies at [facility] °Does NOT accept Medicaid °[facility] °Mazer, MD °[address] [place], [place] [address] °[phone] °Mon-Fri 8:30-5:00 (lunch 12:30-1:00), extended hours by appointment only Wed 5:00-6:30 °Babies seen by [facility] providers °Accepting Medicaid °[facility] °Banks, MD; Jordan, MD; Koberlein, MD °[address], [place], [place] [address] °[phone] °Mon-Fri 8:00-5:00 °Babies seen by [facility] providers °Does NOT accept Medicaid °[facility] °Parker, MD; Hunter, MD; Wallace, DO °[address] [place], [place] [address] °[phone] °Mon-Fri 8:00-5:00 °Babies seen by [facility] providers °Does NOT accept Medicaid °[facility] °Brandon, PA; Brecken, PA; Christy, NP; Dees, MD; DeClaire, MD; DeWeese, MD; Hansen, NP; Mills, NP; Parrish, NP; Smoot, NP; Summer, MD; Vapne,  MD °[address] [place], [place] [address] °[phone] °Mon-Fri 8:30-5:00, Sat 10:00-1:00 °Providers come to see babies at [facility] °Does NOT accept Medicaid °Free prenatal information session Tuesdays at 4:45pm °[facility] °Bouska, MD; Gordon, PA; Jeffery, PA; Weber, PA °[address] [place] [address] °[phone] °Mon-Fri 7:30-5:30 °Babies seen by [facility] providers °[facility] °[address], [place], [place]  [address] °[phone]   Fax - [phone] ° °[place] ([address]) °[facility] °Reese, MD °[address] [place], [place] [address] °[phone] °Mon-Thur 8:00-6:00 °Providers come to see babies at [facility] °Accepting Medicaid °[facility] °Anderson, NP; Badger, MD; Beal, PA; Spencer, PA °[address] [place], [place] [address] °[phone] °Mon-Thur 7:30-7:30, Fri 7:30-4:30 °Babies seen by [facility] providers °Accepting Medicaid °[facility] °Agbuya, MD; Klett, NP; Romgoolam, MD °[address], [place], [place] [address] °[phone] °Mon-Fri 8:30-5:00, Sat 8:30-12:00 °Providers come to see babies at [facility] °Accepting Medicaid °Must have “Meet & Greet” appointment at office prior to delivery °[facility] ([facility]) °McCord, MD; Wallace, MD; Wood, MD °[address], [place], [place] [address] °[phone] °Mon-Wed 8:00-6:00, Thur-Fri 8:00-5:00, Sat 9:00-12:00 °Providers come to see babies at [facility] °Does NOT accept Medicaid °Only accepting siblings of current patients °[facility]  °[address], [place], [place]  [address] °[phone]   Fax - [phone] °[facility] °[address], [place], [place]  [address] °[phone]   Fax -  [phone] ° °[place] ([address]) °[facility] °Cirigliano, DO; [place], DO °[address] [place], [place] [address] °[phone] °Mon-Fri 7:00-5:00 °Babies seen by [facility] providers °Does NOT accept Medicaid °[facility] °Briscoe, MD; Howley, PA; Moreira, PA °[address], [place],  [address] °[phone] °Mon-Fri 8:00-5:00 °Babies seen by [facility] providers °Accepting Medicaid °[facility] °Boyd, MD; Church, PA; Jones, NP; Osborn, PA °[address], ,  [address] °[phone] °Mon-Fri 8:00-5:00 °Babies seen by providers at [facility] °Accepting Medicaid ° °[place] ([address]) °[facility] [place] °Wendling, DO °[address] [place], [place] [address] °[phone] °Mon-Fri 8:00-5:00 °Babies seen by [facility] providers °Does NOT accept Medicaid °Limited availability, please call early in hospitalization to schedule follow-up °[facility] °Calderon, PA; Cummings, MD; Dillard, MD; Martin, PA; Olson, MD; VanDeven, PA °[address], [place], [place] [address] °[phone] °Mon-Fri 8:30-5:00, Sat 9:00-12:00 °Babies seen by providers at [facility] °Accepting Medicaid °Please register online then schedule online or call office °[email or web address] °[facility] ([facility]) °Hunter, NP; Kumar, MD; Martin Rogers, PA °[address], [place], [place] [address] °[phone] °Mon-Fri 8:00-5:00 °Babies seen by providers at [facility] °Accepting Medicaid °[facility] ([facility]) °[place], MD; Kristi Fleenor, NP; West, MD °[address], [place], [place] [address] °[phone] °Mon-Fri 8:00-5:30, Sat&Sun by appointment (phones open at  8:30) °Babies seen by [facility] providers °Accepting Medicaid °Must be a first-time baby or sibling of current patient °[facility] [place]  °[address], [place], [place]  [address] °[phone]   Fax - [phone] ° °[place] ([address]) °[facility] °Brown, PA; Cowen, PA; Rice, MD; Helton, PA; Spry, MD °[address] [place], [place] [address] °[phone] °Mon-Thur 8:00-7:00, Fri 8:00-5:00, Sat 8:00-12:00, Sun 9:00-12:00 °Babies seen by [facility] providers °Accepting Medicaid °[facility] [place] °Coe-Goins, MD; Marshall, MD; Pierre-Louis, MD °[address] [place] [address], [place], [place] [address] °[phone] °Mon-Thur 8:00-5:00 °Babies seen by providers at [facility] °Accepting Medicaid °[facility] °Bratton, MD; Coe-Goins, MD; Hayes, MD; Lewis, MD; List, MD; Lott, MD; Marshall, MD; Moran, MD; O'Brentyn Seehafer, MD; Pierre-Louis, MD; Pitonzo, MD; Scholer, MD; Spangle, MD °[address] [place], [place] [address] °[phone] °Mon-Fri 8:00-5:30, Sat (Oct.-Mar.) 9:00-1:00 °Babies seen by providers at [facility] °Accepting Medicaid °Must fill out new patient packet, available online at [email or web address] °[facility] ([facility]) °Friddle, NP; Harris, NP; Kelly, NP; Logan, MD; Melvin, PA; Poth, MD; Ramadoss, MD; Stanton, NP °[address], [place], [place] [address] °[phone] °Mon-Thur 8:00-5:30, Fri 8:00-5:00 °Babies seen by providers at [facility] °Accepting Medicaid ° °[place] ([address]) °[facility] °Dixon, PA; Woodlawn [place], MD; Pickard, MD; Tapia, PA °[address], [place], [place] [address] °[phone] °Mon-Fri 8:00-5:00 °Babies seen by providers at [facility] °Accepting Medicaid  ° °[place] ([address]) °[facility] [place] °Masneri, DO; Meyers, MD; Nelson, PA °[address], [place], [place] [address] °[phone] °Mon-Fri 8:00-5:00 °Babies seen by providers at [facility] °Does NOT accept Medicaid °Limited appointment availability, please call early in hospitalization ° ° [facility] [place] °Kunedd, DO; McGowen, MD °[address], [place], [place] [address] °[phone] °Mon-Fri 8:00-5:00 °Babies seen by [facility] providers °Does NOT accept Medicaid °[facility] [place] °Cameron, MD; MacDonald, MD; Michaels, PA; Nayak, MD °[address] [place] [address], [place], [place] [address] °[phone] °Mon-Fri 8:00-5:00 °After hours clinic ([address] [place], [place] [address]) [phone] Mon-Fri 5:00-8:00, Sat 12:00-6:00, Sun 10:00-4:00 °Babies seen by [facility] providers °Accepting Medicaid °[facility] [place] °[name], [place], [place]  [address] °[phone]   Fax - [phone] ° °[place] ([address]) °[facility] [place] [facility] °Andy, MD °[address], [place], [place] [address] °[phone] °Mon-Fri 8:00-5:00 °Babies seen by [facility] providers °Does NOT accept Medicaid °[facility] [place] ([facility] [place]) °Eksir, MD °[address], [place], [place] [address] °[phone] °Mon-Thur 8:00-7:00, Fri 8:00-5:00, Sat 8:00-12:00 °Babies seen by providers at [facility] °Accepting Medicaid - but does not have vaccinations in office (must be received elsewhere) °Limited availability, please call early in hospitalization ° °[place] ([address]) °[facility]  °Charlene Flemming, MD °[address], [place] [address] °[phone]  Fax [phone] ° °[place] °[facility]  °[facility]  °Kimberly Newton, MD, Annamarie Streilein, PA, Carla Hampton, PA °[address] [place] °[place], [place]  [address] °[phone] °[facility]  °[address], [place], [place] [address] °[phone] °[address], [place], [place] [address] °[phone] (West Office)  °[facility] °[address], [place], [place] [address] °[phone] °[facility] °[address] [place], [place], [place] [address] °[phone] °[facility] °[address], [place], [place] [address] °[phone] °[facility] °[address], [place], [place] [address] °[phone] °[facility] °[address], [place], [place] [address] °[phone] °[facility] °[address], [place], [place] [address] °[phone] °[facility]  °[address], [place], [place] [address] °[phone] °Dr. Robert W. Little °[address], [place], [place] [address] °[phone] °[facility] °[address], [place], [place] [address] °[phone] °[facility] °[address], , [place] [address] °[phone]  °

## 2021-12-13 ENCOUNTER — Telehealth: Payer: Self-pay

## 2021-12-13 NOTE — Telephone Encounter (Signed)
Dr. Durene Cal has ok'd to take patient on.  Patient will call back to schedule establish care visit.

## 2021-12-19 ENCOUNTER — Other Ambulatory Visit: Payer: Self-pay

## 2021-12-19 ENCOUNTER — Other Ambulatory Visit (HOSPITAL_COMMUNITY): Payer: Self-pay

## 2021-12-19 ENCOUNTER — Other Ambulatory Visit (HOSPITAL_COMMUNITY)
Admission: RE | Admit: 2021-12-19 | Discharge: 2021-12-19 | Disposition: A | Discharge status: Home / Self Care | Payer: Medicaid Other | Source: Ambulatory Visit | Attending: Family Medicine | Admitting: Family Medicine

## 2021-12-19 ENCOUNTER — Ambulatory Visit (INDEPENDENT_AMBULATORY_CARE_PROVIDER_SITE_OTHER): Payer: Medicaid Other | Admitting: Family Medicine

## 2021-12-19 ENCOUNTER — Encounter: Payer: Self-pay | Admitting: Family Medicine

## 2021-12-19 VITALS — BP 121/72 | HR 74 | Wt 154.1 lb

## 2021-12-19 DIAGNOSIS — O0990 Supervision of high risk pregnancy, unspecified, unspecified trimester: Secondary | ICD-10-CM | POA: Insufficient documentation

## 2021-12-19 DIAGNOSIS — F1991 Other psychoactive substance use, unspecified, in remission: Secondary | ICD-10-CM | POA: Diagnosis not present

## 2021-12-19 DIAGNOSIS — N766 Ulceration of vulva: Secondary | ICD-10-CM

## 2021-12-19 DIAGNOSIS — Z23 Encounter for immunization: Secondary | ICD-10-CM | POA: Diagnosis not present

## 2021-12-19 DIAGNOSIS — Z5941 Food insecurity: Secondary | ICD-10-CM

## 2021-12-19 DIAGNOSIS — F319 Bipolar disorder, unspecified: Secondary | ICD-10-CM

## 2021-12-19 DIAGNOSIS — F603 Borderline personality disorder: Secondary | ICD-10-CM

## 2021-12-19 MED ORDER — VALACYCLOVIR HCL 1 G PO TABS
1000.0000 mg | ORAL_TABLET | Freq: Two times a day (BID) | ORAL | 0 refills | Status: AC
  Filled 2021-12-19 (×2): qty 20, 10d supply, fill #0

## 2021-12-19 MED ORDER — BLOOD PRESSURE KIT DEVI: 1.0000 | 0 refills | Status: DC

## 2021-12-19 NOTE — Patient Instructions (Signed)
AREA PEDIATRIC/FAMILY PRACTICE PHYSICIANS ° °[place] ([address]) °[facility] °Chambliss, MD; Eniola, MD; Hale, MD; Hensel, MD; McDiarmid, MD; McIntyer, MD; Neal, MD; Walden, MD °[address] [place], [place] [address] °[phone] °Mon-Fri 8:30-12:30, 1:30-5:00 °Providers come to see babies at [facility] °Accepting Medicaid °[facility] °Limited providers who accept newborns: Koirala, MD; Morrow, MD; Wolters, MD °[address], [place], [place] [address] °[phone] °Mon-Fri 8:00-5:30 °Babies seen by providers at [facility] °Does NOT accept Medicaid °Please call early in hospitalization for appointment (limited availability)  °[facility] °Mulberry, MD °[address] [place], [place] [address] °[phone] °Mon, Tue, Thur, Fri 8:30-5:00, Wed 10:00-7:00 (closed 1-2pm) °Babies seen by [facility] providers °Accepting Medicaid °[facility] °Rubin, MD °[address], [place], [place] [address] °[phone] °Mon-Fri 8:30-5:00, Sat 8:30-12:00 °Provider comes to see babies at [facility] °Accepting Medicaid °Must have been referred from current patients or contacted office prior to delivery °[facility] ([facility]) °Brown, MD; Chandler, MD; Ettefagh, MD; Grant, MD; Lester, MD; McCormick, MD; McQueen, MD; Prose, MD; Simha, MD; Stanley, MD; Stryffeler, NP; Tebben, NP °[address], [place], [place] [address] °[phone] °Mon, Tue, Thur, Fri 8:30-5:30, Wed 9:30-5:30, Sat 8:30-12:30 °Babies seen by [facility] providers °Accepting Medicaid °Only accepting infants of first-time parents or siblings of current patients °Hospital discharge coordinator will make follow-up appointment °Jack Amos °[address], [place], [place]  [address] °[phone]   Fax - [phone] °[facility] °[address], [place], [place]  [address] °Phone - [phone]   Fax - [phone] °Shilpa Gosrani °[address], [place], [place]  [address] °[phone] ° °[place] ([address]) °[facility] °Bates, MD; Brassfield, MD; Cooper, Cox, MD; MD; Davis, MD; Dovico, MD; Ettefaugh, MD; Little, MD; Lowe, MD; Keiffer, MD; Melvin, MD; Sumner, MD; Williams, MD °[address], [place], [place] [address] °[phone] °Mon-Fri 8:30-5:00 (extended evenings Mon-Thur as needed), Sat-Sun 10:00-1:00 °Providers come to see babies at [facility] °Accepting Medicaid for families of first-time babies and families with all children in the household age 3 and under. Must register with office prior to making appointment (M-F only). °[facility] °Henson, NP; Knapp, MD; Lalonde, MD; Tysinger, PA °[address] [place], [place] [address] °[phone] °Mon-Fri 8:00-5:00 °Babies seen by providers at [facility] °Does NOT accept Medicaid/Commercial Insurance Only °[facility] ([facility]  °Artis, MD; Barnes, MD; Bratton, MD; Coccaro, MD; Lockett Gardner, MD; Kramer, MD; Marshall, MD; Netherton, MD; Poleto, MD; Skinner, MD °[address] [place], [place] [address] °[phone] °Mon-Fri 8:30-5:30, Sat (Oct.-Mar.) 9:00-1:00 °Babies seen by providers at [facility] °Accepting Medicaid ° °[place] ([address]) °[facility] °Reid, MD; Warner, MD °[address], [place], [place] [address] °[phone] °Mon-Fri 8:30-5:00, Sat 8:30-12:00 °Providers come to see babies at [facility] °Does NOT accept Medicaid °[facility] °Becker, PA; Hagler, MD; Scifres, PA; Sun, MD; Swayne, MD °[address], [place], [place] [address] °[phone] °Mon-Fri 8:00-5:00 °Babies seen by providers at [facility] °Does NOT accept Medicaid °Only accepting babies of parents who  are patients °Please call early in hospitalization for appointment (limited availability) °[facility] °Clark, MD; Frye, MD; Kelleher, MD; Mack, NP; Miller, MD; O'Keller, MD; Patterson, NP; Pudlo, MD; Puzio, MD; Thomas, MD; Tucker, MD; Twiselton, MD °[address], [place], [place] [address] °[phone] °Mon-Fri 8:00-5:00, Sat 9:00-12:00 °Providers come to see babies at [facility] °Does NOT accept Medicaid ° °[place] ([address]) °[facility] °Limited providers accepting new patients: Brake, NP; Wharton, PA °[address], [place], [place] [address] °[phone] °Mon-Fri 8:00-5:00 °Babies seen by providers at [facility] °Does NOT accept Medicaid °Only accepting babies of parents who are patients °Please call early in hospitalization for appointment (limited availability) °[facility] °Gay, MD; Quinlan, MD °[address] [place], [place] [address] °[phone] (press 1 to schedule appointment) °Mon-Fri 8:00-5:00 °Providers come to see babies at [facility] °Does NOT accept Medicaid °[facility] °Mazer, MD °[address] [place], [place] [address] °[phone] °Mon-Fri 8:30-5:00 (lunch 12:30-1:00), extended hours by appointment only Wed 5:00-6:30 °Babies seen by [facility] providers °Accepting Medicaid °[facility] °Banks, MD; Jordan, MD; Koberlein, MD °[address], [place], [place] [address] °[phone] °Mon-Fri 8:00-5:00 °Babies seen by [facility] providers °Does NOT accept Medicaid °[facility] °Parker, MD; Hunter, MD; Wallace, DO °[address] [place], [place] [address] °[phone] °Mon-Fri 8:00-5:00 °Babies seen by [facility] providers °Does NOT accept Medicaid °[facility] °Brandon, PA; Brecken, PA; Christy, NP; Dees, MD; DeClaire, MD; DeWeese, MD; Hansen, NP; Mills, NP; Parrish, NP; Smoot, NP; Summer, MD; Vapne,  MD °[address] [place], [place] [address] °[phone] °Mon-Fri 8:30-5:00, Sat 10:00-1:00 °Providers come to see babies at [facility] °Does NOT accept Medicaid °Free prenatal information session Tuesdays at 4:45pm °[facility] °Bouska, MD; Gordon, PA; Jeffery, PA; Weber, PA °[address] [place] [address] °[phone] °Mon-Fri 7:30-5:30 °Babies seen by [facility] providers °[facility] °[address], [place], [place]  [address] °[phone]   Fax - [phone] ° °[place] ([address]) °[facility] °Reese, MD °[address] [place], [place] [address] °[phone] °Mon-Thur 8:00-6:00 °Providers come to see babies at [facility] °Accepting Medicaid °[facility] °Anderson, NP; Badger, MD; Beal, PA; Spencer, PA °[address] [place], [place] [address] °[phone] °Mon-Thur 7:30-7:30, Fri 7:30-4:30 °Babies seen by [facility] providers °Accepting Medicaid °[facility] °Agbuya, MD; Klett, NP; Romgoolam, MD °[address], [place], [place] [address] °[phone] °Mon-Fri 8:30-5:00, Sat 8:30-12:00 °Providers come to see babies at [facility] °Accepting Medicaid °Must have “Meet & Greet” appointment at office prior to delivery °[facility] ([facility]) °McCord, MD; Wallace, MD; Wood, MD °[address], [place], [place] [address] °[phone] °Mon-Wed 8:00-6:00, Thur-Fri 8:00-5:00, Sat 9:00-12:00 °Providers come to see babies at [facility] °Does NOT accept Medicaid °Only accepting siblings of current patients °[facility]  °[address], [place], [place]  [address] °[phone]   Fax - [phone] °[facility] °[address], [place], [place]  [address] °[phone]   Fax -  [phone] ° °[place] ([address]) °[facility] °Cirigliano, DO; Matthews, DO °[address] [place], [place] [address] °[phone] °Mon-Fri 7:00-5:00 °Babies seen by [facility] providers °Does NOT accept Medicaid °[facility] °Briscoe, MD; Howley, PA; Moreira, PA °[address], [place], [place] [address] °[phone] °Mon-Fri 8:00-5:00 °Babies seen by [facility] providers °Accepting Medicaid °[facility] °Boyd, MD; Church, PA; Jones, NP; Osborn, PA °[address], [place], [place] [address] °[phone] °Mon-Fri 8:00-5:00 °Babies seen by providers at [facility] °Accepting Medicaid ° °[place] ([address]) °[facility] [place] °Wendling, DO °[address] [place], [place] [address] °[phone] °Mon-Fri 8:00-5:00 °Babies seen by [facility] providers °Does NOT accept Medicaid °Limited availability, please call early in hospitalization to schedule follow-up °[facility] °Calderon, PA; Cummings, MD; Dillard, MD; Martin, PA; Olson, MD; VanDeven, PA °[address], [place], [place] [address] °[phone] °Mon-Fri 8:30-5:00, Sat 9:00-12:00 °Babies seen by providers at [facility] °Accepting Medicaid °Please register online then schedule online or call office °[email or web address] °[facility] ([facility]) °Hunter, NP; Kumar, MD; Martin Rogers, PA °[address], [place], [place] [address] °[phone] °Mon-Fri 8:00-5:00 °Babies seen by providers at [facility] °Accepting Medicaid °[facility] ([facility]) °[place], MD; Kristi Fleenor, NP; West, MD °[address], [place], [place] [address] °[phone] °Mon-Fri 8:00-5:30, Sat&Sun by appointment (phones open at  8:30) °Babies seen by [facility] providers °Accepting Medicaid °Must be a first-time baby or sibling of current patient °[facility] [place]  °[address], [place], [place]  [address] °[phone]   Fax - [phone] ° °[place] ([address]) °[facility] °Brown, PA; Cowen, PA; Rice, MD; Helton, PA; Spry, MD °[address] [place], [place] [address] °[phone] °Mon-Thur 8:00-7:00, Fri 8:00-5:00, Sat 8:00-12:00, Sun 9:00-12:00 °Babies seen by [facility] providers °Accepting Medicaid °[facility] °Coe-Goins, MD; Marshall, MD; Pierre-Louis, MD °[address], [place], [place] [address] °[phone] °Mon-Thur 8:00-5:00 °Babies seen by providers at [facility] °Accepting Medicaid °[facility] °Bratton, MD; Coe-Goins, MD; Hayes, MD; Lewis, MD; List, MD; Lott, MD; Marshall, MD; Moran, MD; O'Neal, MD; Pierre-Louis, MD; Pitonzo, MD; Scholer, MD; Spangle, MD °[address] [place], [place] [address] °[phone] °Mon-Fri 8:00-5:30, Sat (Oct.-Mar.) 9:00-1:00 °Babies seen by providers at [facility] °Accepting Medicaid °Must fill out new patient packet, available online at [email or web address] °[facility] ([facility]) °Friddle, NP; Harris, NP; Kelly, NP; Logan, MD; Melvin, PA; Poth, MD; Ramadoss, MD; Stanton, NP °[address], [place], [place] [address] °[phone] °Mon-Thur 8:00-5:30, Fri 8:00-5:00 °Babies seen by providers at [facility] °Accepting Medicaid ° °[place] ([address]) °[facility] °Dixon, PA; [place], MD; Pickard, MD; Tapia, PA °[address], [place], [place] [address] °[phone] °Mon-Fri 8:00-5:00 °Babies seen by providers at [facility] °Accepting Medicaid  ° °[place] ([address]) °[facility] [place] °Masneri, DO; Meyers, MD; Nelson, PA °[address], [place], [place] [address] °[phone] °Mon-Fri 8:00-5:00 °Babies seen by providers at [facility] °Does NOT accept Medicaid °Limited appointment availability, please call early in hospitalization ° °[facility] [place] °Kunedd, DO; McGowen, MD °[address], [place], [place] [address] °[phone] °Mon-Fri 8:00-5:00 °Babies seen by [facility] providers °Does NOT accept Medicaid °[facility] [place] °Cameron, MD; MacDonald, MD; Michaels, PA; Nayak, MD °[address] [place] [address], [place], [place] [address] °[phone] °Mon-Fri 8:00-5:00 °After hours clinic ([address] [place], [place] [address]) [phone] Mon-Fri 5:00-8:00, Sat 12:00-6:00, Sun 10:00-4:00 °Babies seen by [facility] providers °Accepting Medicaid °[facility] [place] °[name], [place], [place]  [address] °[phone]   Fax - [phone] ° °[place] ([address]) °[facility] [place] [facility] °Andy, MD °[address], [place], [place] [address] °[phone] °Mon-Fri 8:00-5:00 °Babies seen by [facility] providers °Does NOT accept Medicaid °[facility] [place] ([facility] [place]) °Eksir, MD °[address], [place], [place] [address] °[phone] °Mon-Thur 8:00-7:00, Fri 8:00-5:00, Sat 8:00-12:00 °Babies seen by providers at [facility] °Accepting Medicaid - but does not have vaccinations in office (must be received elsewhere) °Limited availability, please call early in hospitalization ° °[place] ([address]) °[facility]  °Charlene Flemming, MD °[address], [place] [address] °[phone]  Fax [phone] ° °[place] [facility]  °[facility]  °Kimberly Newton, MD, Annamarie Streilein, PA, Carla [place], PA °[address] [place] °[place], [place]  [address] °[phone] °[facility]  °[address], [place], [place] [address] °[phone] °[address], [place], [place] [address] °[phone] (West Office)  °[facility] °[address], [place], [place] [address] °[phone] °[facility] °[address], [place], [place] [address] °[phone] °[facility] °[address], [place], [place] [address] °[phone] °[facility] °[address], [place], [place] [address] °[phone] °[facility] °[address], [place], [place] [address] °[phone] °[facility] °[address], [place], [place] [address] °[phone] °[facility]  °[address], [place], [place] [address] °[phone] °Dr. Robert W. Little °[address], [place], [place] [address] °[phone] °[facility] °[address], [place], [place] [address] °[phone] °[facility] °[address], , [place] [address] °[phone] ° °
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
## 2021-12-20 ENCOUNTER — Other Ambulatory Visit (HOSPITAL_COMMUNITY): Payer: Self-pay

## 2021-12-20 LAB — HEMOGLOBIN A1C
Est. average glucose Bld gHb Est-mCnc: 105 mg/dL
Hgb A1c MFr Bld: 5.3 % (ref 4.8–5.6)

## 2021-12-20 LAB — GC/CHLAMYDIA PROBE AMP (~~LOC~~) NOT AT ARMC
Chlamydia: POSITIVE — AB
Comment: NEGATIVE
Comment: NORMAL
Neisseria Gonorrhea: NEGATIVE

## 2021-12-21 ENCOUNTER — Other Ambulatory Visit (HOSPITAL_COMMUNITY): Payer: Self-pay

## 2021-12-21 ENCOUNTER — Telehealth: Payer: Self-pay | Admitting: *Deleted

## 2021-12-21 ENCOUNTER — Encounter: Payer: Self-pay | Admitting: *Deleted

## 2021-12-21 LAB — CULTURE, OB URINE

## 2021-12-21 LAB — URINE CULTURE, OB REFLEX

## 2021-12-21 LAB — HERPES SIMPLEX VIRUS CULTURE

## 2021-12-21 MED ORDER — AZITHROMYCIN 500 MG PO TABS
1000.0000 mg | ORAL_TABLET | Freq: Once | ORAL | 1 refills | Status: AC
  Filled 2021-12-21: qty 2, 1d supply, fill #0

## 2021-12-21 NOTE — Addendum Note (Signed)
Addended by: Truett Mainland on: 12/21/2021 08:13 AM ? ? Modules accepted: Orders ? ?

## 2021-12-21 NOTE — Telephone Encounter (Addendum)
-----   Message from Levie Heritage, DO sent at 12/21/2021  8:13 AM EST ----- ?Positive for clamydia - please let pt know. Azithromycin sent in - can space tablets out over a couple of hours to avoid GI upset. Partner needs to be treated as well. No sex for 2 weeks after both are treated. ? ?3/10  0910  Called pt and she did not answer. I left message on her personal voice mail stating that I am calling with information of abnormal test result and prescription information. She may check her Mychart for the test information. I will send Mychart message and she may respond or call the office if she has questions.   ?

## 2021-12-22 ENCOUNTER — Other Ambulatory Visit (HOSPITAL_COMMUNITY): Payer: Self-pay

## 2021-12-23 ENCOUNTER — Inpatient Hospital Stay (HOSPITAL_COMMUNITY)
Admission: AD | Admit: 2021-12-23 | Discharge: 2021-12-23 | Disposition: A | Discharge status: Home / Self Care | Payer: Medicaid Other | Attending: Obstetrics & Gynecology | Admitting: Obstetrics & Gynecology

## 2021-12-23 ENCOUNTER — Encounter (HOSPITAL_COMMUNITY): Payer: Self-pay | Admitting: Obstetrics & Gynecology

## 2021-12-23 ENCOUNTER — Other Ambulatory Visit: Payer: Self-pay

## 2021-12-23 DIAGNOSIS — Z3A11 11 weeks gestation of pregnancy: Secondary | ICD-10-CM | POA: Diagnosis not present

## 2021-12-23 DIAGNOSIS — O98819 Other maternal infectious and parasitic diseases complicating pregnancy, unspecified trimester: Secondary | ICD-10-CM

## 2021-12-23 DIAGNOSIS — O99340 Other mental disorders complicating pregnancy, unspecified trimester: Secondary | ICD-10-CM | POA: Insufficient documentation

## 2021-12-23 DIAGNOSIS — O26899 Other specified pregnancy related conditions, unspecified trimester: Secondary | ICD-10-CM

## 2021-12-23 DIAGNOSIS — R109 Unspecified abdominal pain: Secondary | ICD-10-CM | POA: Insufficient documentation

## 2021-12-23 DIAGNOSIS — O26891 Other specified pregnancy related conditions, first trimester: Secondary | ICD-10-CM | POA: Insufficient documentation

## 2021-12-23 DIAGNOSIS — A749 Chlamydial infection, unspecified: Secondary | ICD-10-CM

## 2021-12-23 LAB — URINALYSIS, ROUTINE W REFLEX MICROSCOPIC
Bilirubin Urine: NEGATIVE
Glucose, UA: NEGATIVE mg/dL
Hgb urine dipstick: NEGATIVE
Ketones, ur: NEGATIVE mg/dL
Nitrite: NEGATIVE
Protein, ur: NEGATIVE mg/dL
Specific Gravity, Urine: 1.013 (ref 1.005–1.030)
pH: 7 (ref 5.0–8.0)

## 2021-12-23 MED ORDER — ONDANSETRON 4 MG PO TBDP: 4.0000 mg | ORAL_TABLET | Freq: Three times a day (TID) | ORAL | 2 refills | Status: DC | PRN

## 2021-12-23 MED ORDER — METOCLOPRAMIDE HCL 10 MG PO TABS
10.0000 mg | ORAL_TABLET | Freq: Four times a day (QID) | ORAL | 0 refills | Status: DC
Start: 2021-12-23 — End: 2022-02-14
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
## 2021-12-23 NOTE — Discharge Instructions (Signed)

## 2021-12-23 NOTE — MAU Note (Signed)
Janet Horn is a 21 y.o. at [redacted]w[redacted]d here in MAU reporting: took her antibiotic for Chlamydia yesterday afternoon. Became nauseated and threw up about 4 hours later.  Cramping started after she vomited.   Still cramping and threw up again today.  The cramping was so bad she could hardly get out of bed. ?LMP:  ?Onset of complaint: yesterday ?Pain score: 5/10 (moderate) ?Vitals:  ? 12/23/21 1437  ?BP: 111/63  ?Pulse: 84  ?Resp: 18  ?Temp: 97.9 ?F (36.6 ?C)  ?SpO2: 99%  ?   ?BPZ:WCHENI to hear with doppler ?Lab orders placed from triage:  ua ?

## 2021-12-23 NOTE — MAU Note (Signed)
C/o lower abd. Pain starting around 1930 last p.m. ?

## 2021-12-24 ENCOUNTER — Encounter: Payer: Self-pay | Admitting: Family Medicine

## 2021-12-24 DIAGNOSIS — B009 Herpesviral infection, unspecified: Secondary | ICD-10-CM | POA: Insufficient documentation

## 2021-12-24 DIAGNOSIS — A6000 Herpesviral infection of urogenital system, unspecified: Secondary | ICD-10-CM | POA: Insufficient documentation

## 2021-12-26 ENCOUNTER — Encounter: Payer: Self-pay | Admitting: Family Medicine

## 2021-12-26 LAB — CBC/D/PLT+RPR+RH+ABO+RUBIGG...
Antibody Screen: NEGATIVE
Basophils Absolute: 0 10*3/uL (ref 0.0–0.2)
Basos: 0 %
EOS (ABSOLUTE): 0.2 10*3/uL (ref 0.0–0.4)
Eos: 2 %
HCV Ab: NONREACTIVE
HIV Screen 4th Generation wRfx: NONREACTIVE
Hematocrit: 38.9 % (ref 34.0–46.6)
Hemoglobin: 12.8 g/dL (ref 11.1–15.9)
Hepatitis B Surface Ag: NEGATIVE
Immature Grans (Abs): 0 10*3/uL (ref 0.0–0.1)
Immature Granulocytes: 0 %
Lymphocytes Absolute: 1.9 10*3/uL (ref 0.7–3.1)
Lymphs: 23 %
MCH: 25 pg — ABNORMAL LOW (ref 26.6–33.0)
MCHC: 32.9 g/dL (ref 31.5–35.7)
MCV: 76 fL — ABNORMAL LOW (ref 79–97)
Monocytes Absolute: 0.5 10*3/uL (ref 0.1–0.9)
Monocytes: 7 %
Neutrophils Absolute: 5.3 10*3/uL (ref 1.4–7.0)
Neutrophils: 68 %
Platelets: 294 10*3/uL (ref 150–450)
RBC: 5.11 x10E6/uL (ref 3.77–5.28)
RDW: 14.1 % (ref 11.7–15.4)
RPR Ser Ql: NONREACTIVE
Rh Factor: POSITIVE
Rubella Antibodies, IGG: 6.12 index (ref >0.99)
WBC: 7.9 10*3/uL (ref 3.4–10.8)

## 2021-12-26 LAB — CULTURE, OB URINE: Culture: 10000 — AB

## 2021-12-26 LAB — HCV INTERPRETATION

## 2021-12-28 ENCOUNTER — Encounter: Payer: Self-pay | Admitting: Family Medicine

## 2021-12-28 ENCOUNTER — Encounter: Payer: Self-pay | Admitting: *Deleted

## 2022-01-22 ENCOUNTER — Ambulatory Visit (INDEPENDENT_AMBULATORY_CARE_PROVIDER_SITE_OTHER): Payer: Medicaid Other | Admitting: Family Medicine

## 2022-01-22 VITALS — BP 116/78 | HR 67 | Wt 155.9 lb

## 2022-01-22 DIAGNOSIS — A6004 Herpesviral vulvovaginitis: Secondary | ICD-10-CM

## 2022-01-22 DIAGNOSIS — A749 Chlamydial infection, unspecified: Secondary | ICD-10-CM

## 2022-01-22 DIAGNOSIS — O98812 Other maternal infectious and parasitic diseases complicating pregnancy, second trimester: Secondary | ICD-10-CM

## 2022-01-22 DIAGNOSIS — R011 Cardiac murmur, unspecified: Secondary | ICD-10-CM

## 2022-01-22 DIAGNOSIS — F319 Bipolar disorder, unspecified: Secondary | ICD-10-CM

## 2022-01-22 DIAGNOSIS — F1991 Other psychoactive substance use, unspecified, in remission: Secondary | ICD-10-CM

## 2022-01-22 DIAGNOSIS — O0990 Supervision of high risk pregnancy, unspecified, unspecified trimester: Secondary | ICD-10-CM

## 2022-01-22 MED ORDER — ACETAMINOPHEN 325 MG PO TABS
650.0000 mg | ORAL_TABLET | Freq: Four times a day (QID) | ORAL | 1 refills | Status: DC | PRN
Start: 2022-01-22 — End: 2024-01-20

## 2022-01-22 MED ORDER — CYCLOBENZAPRINE HCL 5 MG PO TABS: 5.0000 mg | ORAL_TABLET | Freq: Three times a day (TID) | ORAL | 0 refills | Status: DC | PRN

## 2022-01-22 NOTE — Progress Notes (Signed)
? ?  Subjective:  ?Janet Horn is a 21 y.o. G2P1000 at [redacted]w[redacted]d being seen today for ongoing prenatal care.  She is currently monitored for the following issues for this high-risk pregnancy and has Severe episode of recurrent major depressive disorder, without psychotic features ([place]); Insomnia; Decreased appetite; MDD (major depressive disorder), recurrent, severe, with psychosis ([place]); PTSD (post-traumatic stress disorder); Bereavement; Bipolar I disorder, most recent episode depressed ([place]); Borderline personality disorder ([place]); Adjustment disorder with mixed emotional features; Bipolar 1 disorder ([place]); Supervision of high risk pregnancy, antepartum; Heart murmur; History of drug use; Chlamydia infection affecting pregnancy; and 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 ? ?Patient reports headache.  Contractions: Not present. Vag. Bleeding: None.  Movement: Absent. Denies leaking of fluid.  ? ?The following portions of the patient's history were reviewed and updated as appropriate: allergies, current medications, past family history, past medical history, past social history, past surgical history and problem list. Problem list updated. ? ?Objective:  ? ?Vitals:  ? 01/22/22 1355  ?BP: 116/78  ?Pulse: 67  ?Weight: 155 lb 14.4 oz (70.7 kg)  ? ? ?Fetal Status: Fetal Heart Rate (bpm): 145   Movement: Absent    ? ?General:  Alert, oriented and cooperative. Patient is in no acute distress.  ?Skin: Skin is warm and dry. No rash noted.   ?Cardiovascular: Normal heart rate noted  ?Respiratory: Normal respiratory effort, no problems with respiration noted  ?Abdomen: Soft, gravid, appropriate for gestational age. Pain/Pressure: Present     ?Pelvic: Vag. Bleeding: None     ?Cervical exam deferred        ?Extremities: Normal range of motion.  Edema: None  ?Mental Status: Normal mood and affect. Normal behavior. Normal judgment and thought content.  ? ?Urinalysis:     ? ?Assessment and Plan:  ?Pregnancy: G2P1000 at  [redacted]w[redacted]d ? ?1. Supervision of high risk pregnancy, antepartum ?BP and FHR normal ?AFP today ?Trial flexeril for headache/back pain ? ?2. Heart murmur ?Clarified that murmurs run in the family but she herself does not ?Removed from problem list ? ?3. 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 ?4. Herpes simplex vulvovaginitis ?Diagnosed last visit ?No outbreak currently, valtrex worked well ? ?5. Bipolar 1 disorder ([place]) ?Not on any meds currently, going to see a psychiatrist on 02/05/2022 for discussion of medication management ? ?6. Chlamydia infection affecting pregnancy in second trimester ?Rx sent after last visit, she took it immediately after taking it ?TOC today ? ?Preterm labor symptoms and general obstetric precautions including but not limited to vaginal bleeding, contractions, leaking of fluid and fetal movement were reviewed in detail with the patient. ?Please refer to After Visit Summary for other counseling recommendations.  ?Return in 4 weeks (on 02/19/2022). ? ? ?Clarnce Flock, MD ? ?

## 2022-01-22 NOTE — Progress Notes (Signed)
r 

## 2022-01-22 NOTE — Patient Instructions (Signed)

## 2022-01-24 LAB — AFP, SERUM, OPEN SPINA BIFIDA
AFP MoM: 1.88
AFP Value: 63.4 ng/mL
Gest. Age on Collection Date: 16 weeks
Maternal Age At EDD: 21 yr
OSBR Risk 1 IN: 1058
Test Results:: NEGATIVE
Weight: 155 [lb_av]

## 2022-01-31 ENCOUNTER — Telehealth: Payer: Self-pay

## 2022-01-31 NOTE — Telephone Encounter (Signed)
Spoke to patient to inform on [facility] screen result. Patient understood and is aware CMA will send a MyChart message of Janet Horn genetic counselor that can go more depth and detail of the result. ? ?-Felecia Shelling, CMA ?01/31/2022 ?

## 2022-02-05 ENCOUNTER — Other Ambulatory Visit (HOSPITAL_COMMUNITY): Payer: Self-pay

## 2022-02-10 ENCOUNTER — Encounter: Payer: Self-pay | Admitting: Family Medicine

## 2022-02-12 ENCOUNTER — Other Ambulatory Visit (HOSPITAL_COMMUNITY): Payer: Self-pay

## 2022-02-12 ENCOUNTER — Encounter: Payer: Self-pay | Admitting: Family Medicine

## 2022-02-12 MED ORDER — SERTRALINE HCL 25 MG PO TABS
ORAL_TABLET | ORAL | 0 refills | Status: DC
  Filled 2022-02-12: qty 30, 30d supply, fill #0

## 2022-02-14 ENCOUNTER — Ambulatory Visit: Payer: Medicaid Other | Admitting: *Deleted

## 2022-02-14 ENCOUNTER — Ambulatory Visit: Payer: Medicaid Other | Attending: Family Medicine

## 2022-02-14 ENCOUNTER — Ambulatory Visit: Payer: Medicaid Other

## 2022-02-14 VITALS — BP 117/58 | HR 77

## 2022-02-14 DIAGNOSIS — R011 Cardiac murmur, unspecified: Secondary | ICD-10-CM | POA: Diagnosis not present

## 2022-02-14 DIAGNOSIS — F1991 Other psychoactive substance use, unspecified, in remission: Secondary | ICD-10-CM

## 2022-02-14 DIAGNOSIS — O0990 Supervision of high risk pregnancy, unspecified, unspecified trimester: Secondary | ICD-10-CM | POA: Insufficient documentation

## 2022-02-18 IMAGING — CT CT ABD-PELV W/O CM
2 of 4 series · 16 of 46 positions shown, 18 images · non-contrast
Comparison: None.

CLINICAL DATA: 19-year-old female with right lower quadrant
abdominal pain.

EXAM:
CT ABDOMEN AND PELVIS WITHOUT CONTRAST
TECHNIQUE: Multidetector CT imaging of the abdomen and pelvis was performed
following the standard protocol without IV contrast.

[Series 3: a/p w/o 5mm · axial · non-contrast · 0.85mm/px · z∈[+1178,+1608]mm · 13 of 94 slices shown, 15 images]
[im 4/94  soft-tissue]
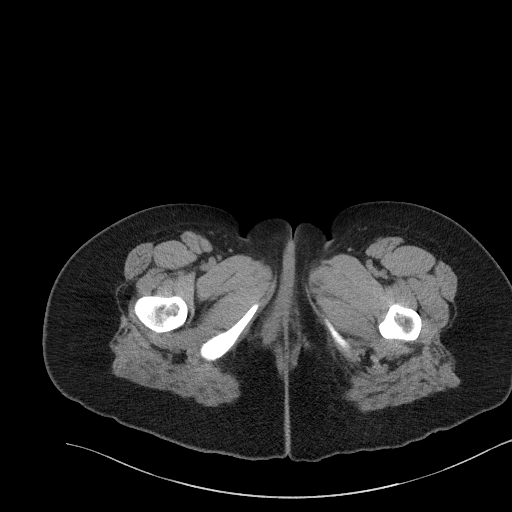
[im 4/94  bone]
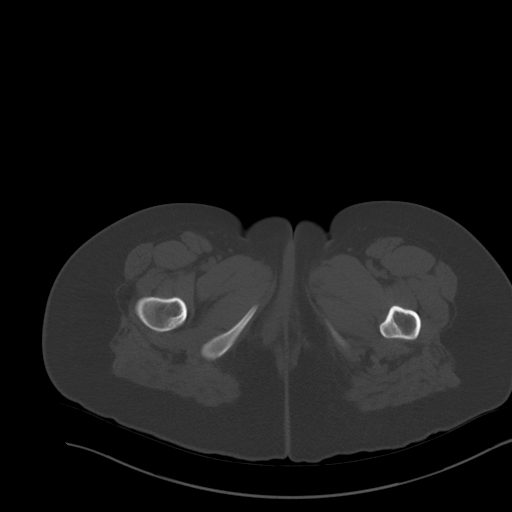
[im 12/94  soft-tissue]
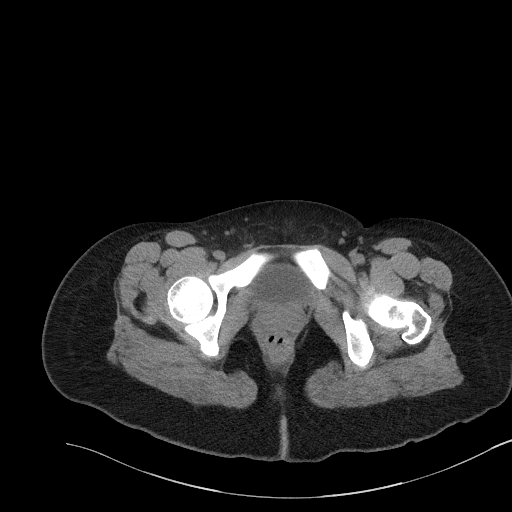
[im 20/94  soft-tissue]
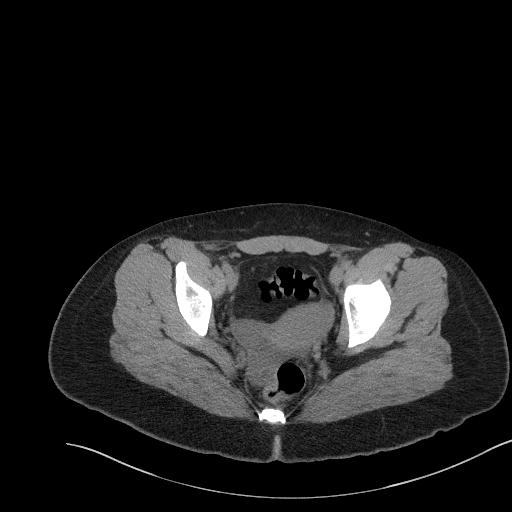
[im 28/94  soft-tissue]
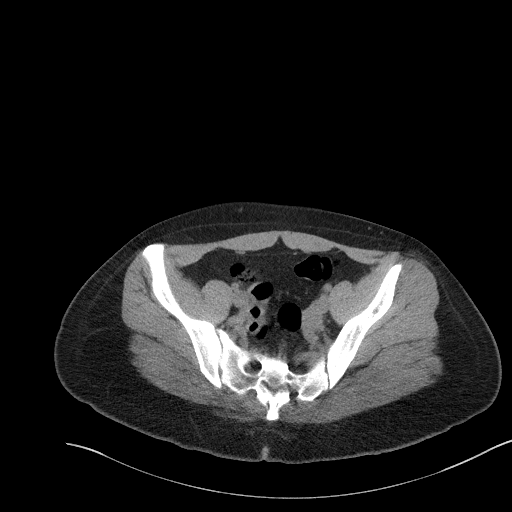
[im 32/94  soft-tissue]
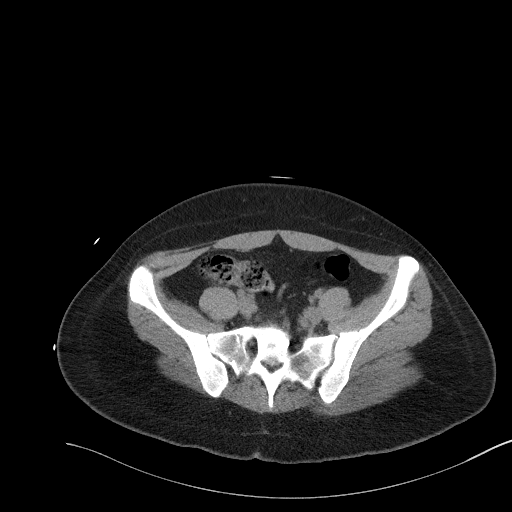
[im 39/94  soft-tissue]
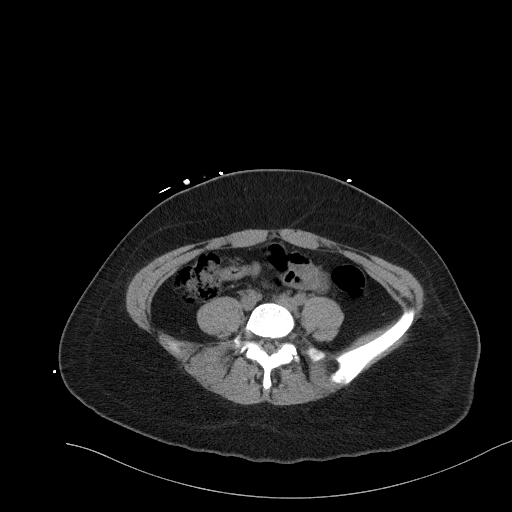
[im 47/94  soft-tissue]
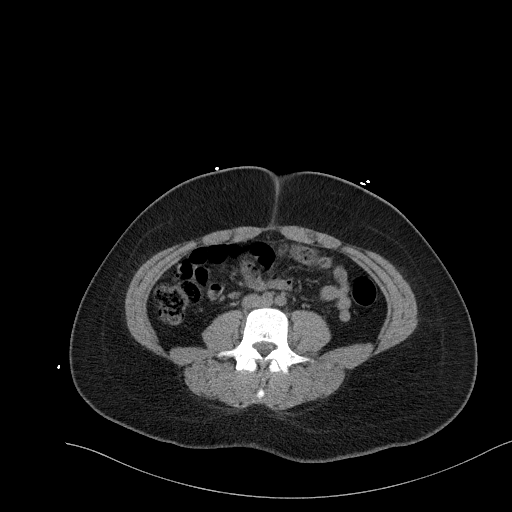
[im 55/94  soft-tissue]
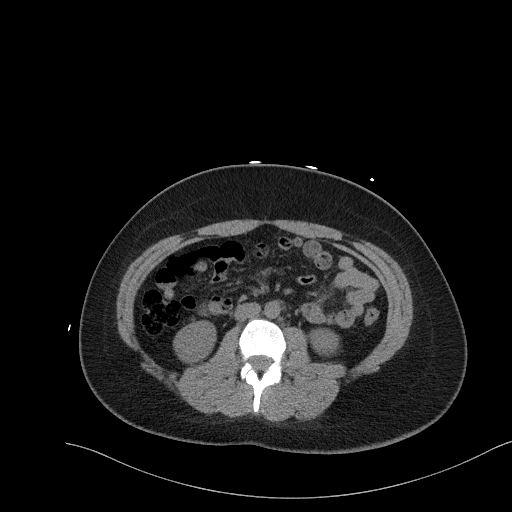
[im 63/94  soft-tissue]
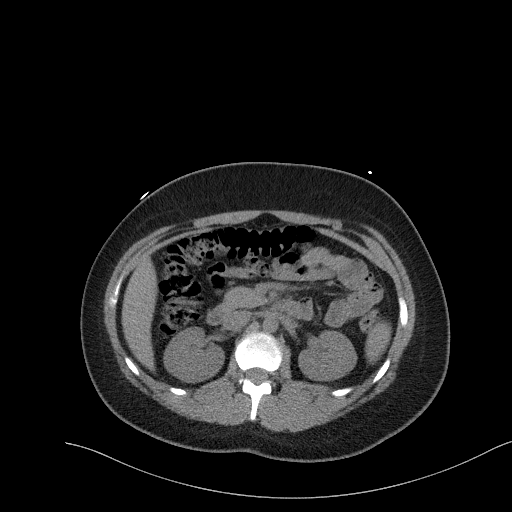
[im 63/94  bone]
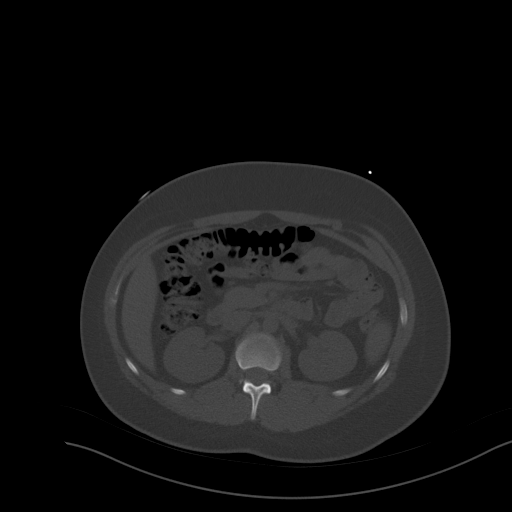
[im 66/94  soft-tissue]
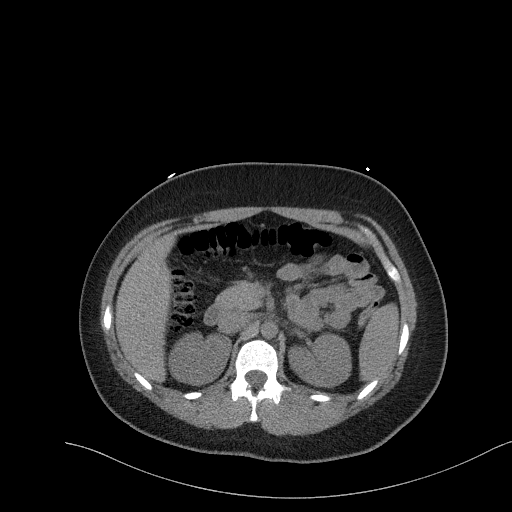
[im 74/94  soft-tissue]
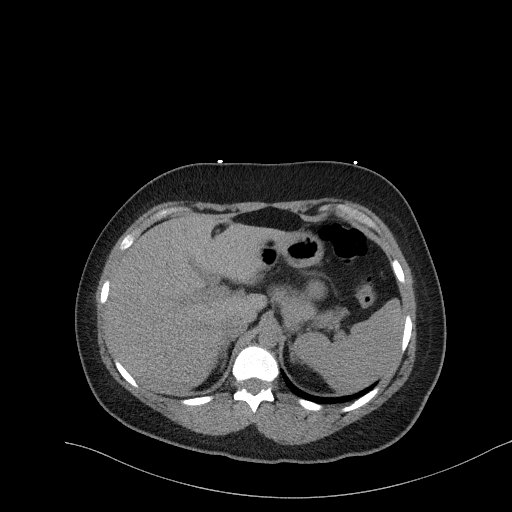
[im 82/94  soft-tissue]
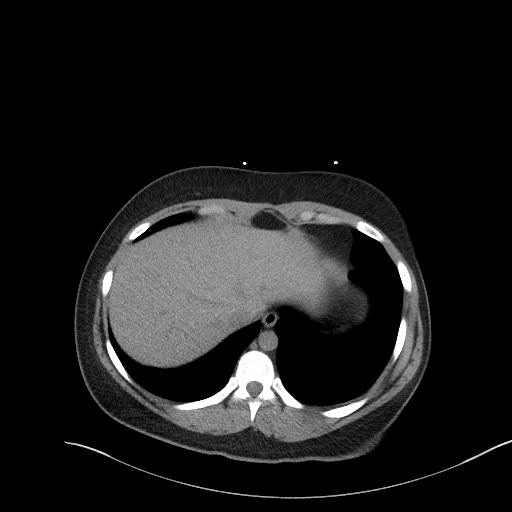
[im 90/94  soft-tissue]
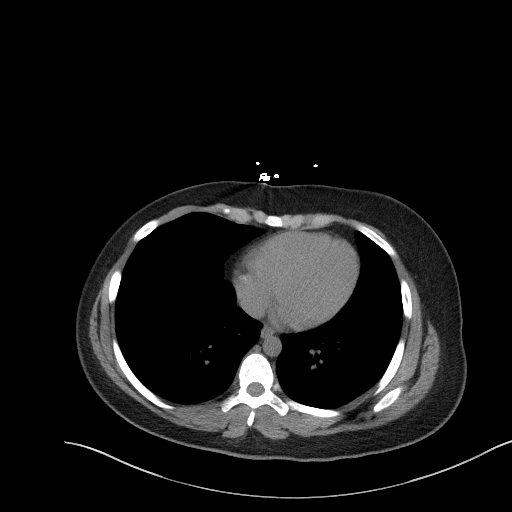

[Series 6: a/p w/o cor · coronal · non-contrast · 0.79mm/px · 3 of 150 slices shown]
[im 50/150  soft-tissue]
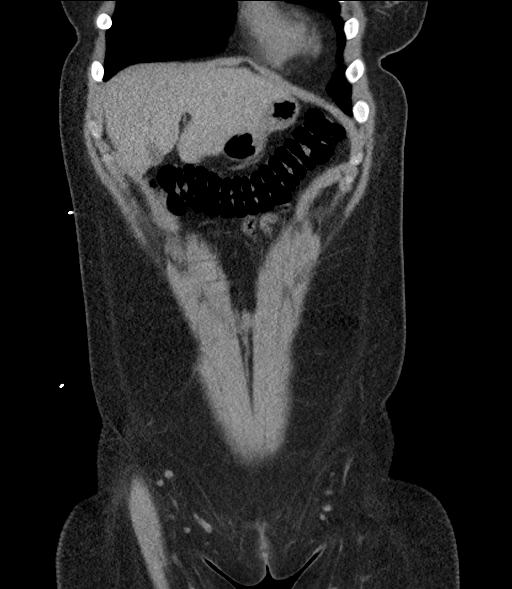
[im 67/150  soft-tissue]
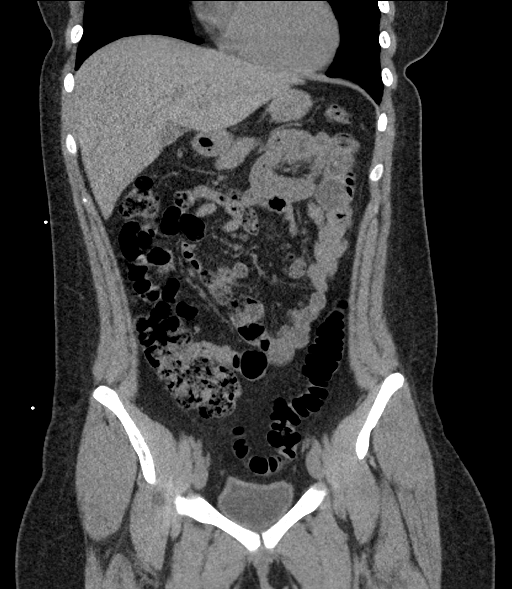
[im 83/150  soft-tissue]
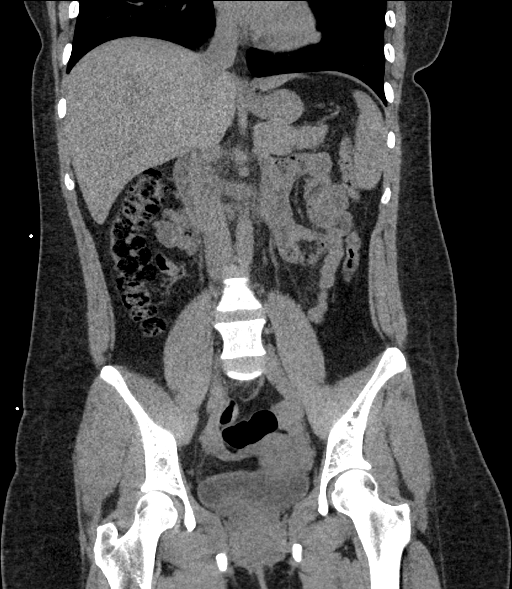

[16 of 46 positions shown; findings below may reference images not displayed]

FINDINGS: Evaluation of this exam is limited in the absence of intravenous
contrast.

Lower chest: The visualized lung bases are clear.

No intra-abdominal free air. Trace free fluid in the pelvis.

Hepatobiliary: No focal liver abnormality is seen. No gallstones,
gallbladder wall thickening, or biliary dilatation.

Pancreas: Unremarkable. No pancreatic ductal dilatation or
surrounding inflammatory changes.

Spleen: Normal in size without focal abnormality.

Adrenals/Urinary Tract: The adrenal glands are unremarkable. The
kidneys, visualized ureters, and urinary bladder are unremarkable.

Stomach/Bowel: There is no bowel obstruction or active inflammation.
The appendix is normal.

Vascular/Lymphatic: The abdominal aorta and IVC are unremarkable on
this noncontrast CT. No portal venous gas. There is no adenopathy.

Reproductive: The uterus is grossly unremarkable. No adnexal masses.

Other: None

Musculoskeletal: No acute or significant osseous findings.
IMPRESSION: No acute intra-abdominal or pelvic pathology. No bowel obstruction.
Normal appendix.

## 2022-02-19 ENCOUNTER — Other Ambulatory Visit (HOSPITAL_COMMUNITY): Payer: Self-pay

## 2022-02-19 ENCOUNTER — Other Ambulatory Visit (HOSPITAL_COMMUNITY)
Admission: RE | Admit: 2022-02-19 | Discharge: 2022-02-19 | Disposition: A | Discharge status: Home / Self Care | Payer: Medicaid Other | Source: Ambulatory Visit | Attending: Family Medicine | Admitting: Family Medicine

## 2022-02-19 ENCOUNTER — Ambulatory Visit (INDEPENDENT_AMBULATORY_CARE_PROVIDER_SITE_OTHER): Payer: Medicaid Other | Admitting: Family Medicine

## 2022-02-19 ENCOUNTER — Encounter: Payer: Self-pay | Admitting: Family Medicine

## 2022-02-19 VITALS — BP 105/70 | HR 75 | Wt 163.2 lb

## 2022-02-19 DIAGNOSIS — A6004 Herpesviral vulvovaginitis: Secondary | ICD-10-CM

## 2022-02-19 DIAGNOSIS — A749 Chlamydial infection, unspecified: Secondary | ICD-10-CM

## 2022-02-19 DIAGNOSIS — O98812 Other maternal infectious and parasitic diseases complicating pregnancy, second trimester: Secondary | ICD-10-CM | POA: Diagnosis present

## 2022-02-19 DIAGNOSIS — F319 Bipolar disorder, unspecified: Secondary | ICD-10-CM

## 2022-02-19 DIAGNOSIS — F1991 Other psychoactive substance use, unspecified, in remission: Secondary | ICD-10-CM

## 2022-02-19 DIAGNOSIS — O0990 Supervision of high risk pregnancy, unspecified, unspecified trimester: Secondary | ICD-10-CM

## 2022-02-19 MED ORDER — HYDROXYZINE PAMOATE 25 MG PO CAPS
25.0000 mg | ORAL_CAPSULE | Freq: Three times a day (TID) | ORAL | 5 refills | Status: DC | PRN
  Filled 2022-02-19: qty 30, 10d supply, fill #0

## 2022-02-19 NOTE — Progress Notes (Signed)
Patient in for routine prenatal visit. States that she has been having back pain and has noticed a rash that has started on her outer vaginal area, minimal itching or burning. Reports no shaving, no change in detergent or soaps.  ? ?Completing TOC today for previous +Chlamydia on 3/8. ? ?No other issues or concerns at this time. Reports good fetal movement and FHR. ? ?Janet Horn, CMA ? ?

## 2022-02-19 NOTE — Progress Notes (Signed)
? ?  Subjective:  ?Janet Horn is a 21 y.o. G2P0010 at [redacted]w[redacted]d being seen today for ongoing prenatal care.  She is currently monitored for the following issues for this high-risk pregnancy and has Severe episode of recurrent major depressive disorder, without psychotic features ([place]); Insomnia; Decreased appetite; MDD (major depressive disorder), recurrent, severe, with psychosis ([place]); PTSD (post-traumatic stress disorder); Bereavement; Bipolar I disorder, most recent episode depressed ([place]); Borderline personality disorder ([place]); Adjustment disorder with mixed emotional features; Bipolar 1 disorder ([place]); Supervision of high risk pregnancy, antepartum; History of drug use; Chlamydia infection affecting pregnancy; and 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 ? ?Patient reports no complaints.  Contractions: Not present. Vag. Bleeding: None.  Movement: Present. Denies leaking of fluid.  ? ?The following portions of the patient's history were reviewed and updated as appropriate: allergies, current medications, past family history, past medical history, past social history, past surgical history and problem list. Problem list updated. ? ?Objective:  ? ?Vitals:  ? 02/19/22 1453  ?BP: 105/70  ?Pulse: 75  ?Weight: 163 lb 3.2 oz (74 kg)  ? ? ?Fetal Status: Fetal Heart Rate (bpm): 140   Movement: Present    ? ?General:  Alert, oriented and cooperative. Patient is in no acute distress.  ?Skin: Skin is warm and dry. No rash noted.   ?Cardiovascular: Normal heart rate noted  ?Respiratory: Normal respiratory effort, no problems with respiration noted  ?Abdomen: Soft, gravid, appropriate for gestational age. Pain/Pressure: Present     ?Pelvic: Vag. Bleeding: None     ?Cervical exam deferred        ?Extremities: Normal range of motion.  Edema: None  ?Mental Status: Normal mood and affect. Normal behavior. Normal judgment and thought content.  ? ?Urinalysis:     ? ?Assessment and Plan:  ?Pregnancy: G2P0010 at [redacted]w[redacted]d ? ?1.  Supervision of high risk pregnancy, antepartum ?BP and FHR normal ? ?2. History of drug use ?Remote hx, abstinent x3 years ? ?3. Chlamydia infection affecting pregnancy in second trimester ?TOC today ? ?4. Herpes simplex vulvovaginitis ?Asked for exam ?Some lesions still healing, provided reassurance it can take some time ? ?5. Bipolar 1 disorder ([place]) ?Planning to see psychiatry at last visit, went on 02/05/2022 and switched to Zoloft 25 mg ?Psych recommended stopping vistaril as he did not want her on it in pregnancy ?Discussed that we want to make sure she is doing well, as without her there is no healthy pregnancy ?We discussed zoloft is very safe and used commonly in pregnancy as is vistaril ?Rx sent for vistaril, asked her to review our discussion with her psychiatrist at her next visit ? ?Preterm labor symptoms and general obstetric precautions including but not limited to vaginal bleeding, contractions, leaking of fluid and fetal movement were reviewed in detail with the patient. ?Please refer to After Visit Summary for other counseling recommendations.  ?Return in 4 weeks (on 03/19/2022) for [place] patient, ob visit. ? ? ?Clarnce Flock, MD ? ?

## 2022-02-19 NOTE — Patient Instructions (Signed)

## 2022-02-21 ENCOUNTER — Other Ambulatory Visit (HOSPITAL_COMMUNITY): Payer: Self-pay

## 2022-02-21 LAB — CERVICOVAGINAL ANCILLARY ONLY
Bacterial Vaginitis (gardnerella): POSITIVE — AB
Candida Glabrata: NEGATIVE
Candida Vaginitis: POSITIVE — AB
Chlamydia: NEGATIVE
Comment: NEGATIVE
Comment: NEGATIVE
Comment: NEGATIVE
Comment: NEGATIVE
Comment: NEGATIVE
Comment: NORMAL
Neisseria Gonorrhea: NEGATIVE
Trichomonas: NEGATIVE

## 2022-02-21 MED ORDER — FLUCONAZOLE 150 MG PO TABS
150.0000 mg | ORAL_TABLET | Freq: Once | ORAL | 0 refills | Status: AC
  Filled 2022-02-21: qty 1, 1d supply, fill #0

## 2022-02-21 MED ORDER — METRONIDAZOLE 500 MG PO TABS
500.0000 mg | ORAL_TABLET | Freq: Two times a day (BID) | ORAL | 0 refills | Status: AC
  Filled 2022-02-21: qty 14, 7d supply, fill #0

## 2022-02-21 NOTE — Addendum Note (Signed)
Addended by: Merian Capron on: 02/21/2022 11:37 AM ? ? Modules accepted: Orders ? ?

## 2022-03-07 ENCOUNTER — Encounter: Payer: Self-pay | Admitting: Family Medicine

## 2022-03-09 ENCOUNTER — Other Ambulatory Visit (HOSPITAL_COMMUNITY): Payer: Self-pay

## 2022-03-19 ENCOUNTER — Telehealth: Payer: Self-pay | Admitting: Family Medicine

## 2022-03-19 ENCOUNTER — Encounter: Payer: Self-pay | Admitting: Family Medicine

## 2022-03-19 NOTE — Telephone Encounter (Signed)
See mychart message.  Encounter closed. Judeth Cornfield, RN

## 2022-03-19 NOTE — Telephone Encounter (Signed)
Not feeling well, having a lot of pain, more so in the pelvic area requesting to speak with a nurse.

## 2022-03-20 ENCOUNTER — Encounter: Payer: Self-pay | Admitting: Family Medicine

## 2022-03-21 ENCOUNTER — Encounter: Payer: Self-pay | Admitting: Family Medicine

## 2022-03-21 ENCOUNTER — Encounter (HOSPITAL_COMMUNITY): Payer: Self-pay | Admitting: Obstetrics and Gynecology

## 2022-03-21 ENCOUNTER — Other Ambulatory Visit: Payer: Self-pay

## 2022-03-21 ENCOUNTER — Ambulatory Visit (INDEPENDENT_AMBULATORY_CARE_PROVIDER_SITE_OTHER): Payer: Medicaid Other | Admitting: Family Medicine

## 2022-03-21 ENCOUNTER — Inpatient Hospital Stay (HOSPITAL_COMMUNITY)
Admission: AD | Admit: 2022-03-21 | Discharge: 2022-03-21 | Disposition: A | Discharge status: Home / Self Care | Payer: Medicaid Other | Attending: Obstetrics and Gynecology | Admitting: Obstetrics and Gynecology

## 2022-03-21 ENCOUNTER — Inpatient Hospital Stay (HOSPITAL_BASED_OUTPATIENT_CLINIC_OR_DEPARTMENT_OTHER): Payer: Medicaid Other

## 2022-03-21 ENCOUNTER — Other Ambulatory Visit (HOSPITAL_COMMUNITY): Payer: Self-pay

## 2022-03-21 ENCOUNTER — Inpatient Hospital Stay (HOSPITAL_COMMUNITY): Payer: Medicaid Other

## 2022-03-21 VITALS — BP 121/79 | HR 76 | Wt 169.4 lb

## 2022-03-21 DIAGNOSIS — R011 Cardiac murmur, unspecified: Secondary | ICD-10-CM

## 2022-03-21 DIAGNOSIS — R519 Headache, unspecified: Secondary | ICD-10-CM | POA: Diagnosis not present

## 2022-03-21 DIAGNOSIS — O26892 Other specified pregnancy related conditions, second trimester: Secondary | ICD-10-CM | POA: Diagnosis not present

## 2022-03-21 DIAGNOSIS — R61 Generalized hyperhidrosis: Secondary | ICD-10-CM | POA: Diagnosis not present

## 2022-03-21 DIAGNOSIS — R55 Syncope and collapse: Secondary | ICD-10-CM | POA: Diagnosis not present

## 2022-03-21 DIAGNOSIS — O99891 Other specified diseases and conditions complicating pregnancy: Secondary | ICD-10-CM

## 2022-03-21 DIAGNOSIS — Z8249 Family history of ischemic heart disease and other diseases of the circulatory system: Secondary | ICD-10-CM | POA: Diagnosis not present

## 2022-03-21 DIAGNOSIS — R42 Dizziness and giddiness: Secondary | ICD-10-CM

## 2022-03-21 DIAGNOSIS — R002 Palpitations: Secondary | ICD-10-CM | POA: Insufficient documentation

## 2022-03-21 DIAGNOSIS — R079 Chest pain, unspecified: Secondary | ICD-10-CM | POA: Insufficient documentation

## 2022-03-21 DIAGNOSIS — R0602 Shortness of breath: Secondary | ICD-10-CM | POA: Diagnosis not present

## 2022-03-21 DIAGNOSIS — O09292 Supervision of pregnancy with other poor reproductive or obstetric history, second trimester: Secondary | ICD-10-CM | POA: Insufficient documentation

## 2022-03-21 DIAGNOSIS — R6883 Chills (without fever): Secondary | ICD-10-CM | POA: Diagnosis not present

## 2022-03-21 DIAGNOSIS — Z638 Other specified problems related to primary support group: Secondary | ICD-10-CM | POA: Diagnosis not present

## 2022-03-21 DIAGNOSIS — R0601 Orthopnea: Secondary | ICD-10-CM | POA: Diagnosis not present

## 2022-03-21 DIAGNOSIS — F419 Anxiety disorder, unspecified: Secondary | ICD-10-CM | POA: Diagnosis not present

## 2022-03-21 DIAGNOSIS — M549 Dorsalgia, unspecified: Secondary | ICD-10-CM

## 2022-03-21 DIAGNOSIS — G4700 Insomnia, unspecified: Secondary | ICD-10-CM | POA: Diagnosis not present

## 2022-03-21 DIAGNOSIS — O99352 Diseases of the nervous system complicating pregnancy, second trimester: Secondary | ICD-10-CM | POA: Diagnosis not present

## 2022-03-21 DIAGNOSIS — O99342 Other mental disorders complicating pregnancy, second trimester: Secondary | ICD-10-CM | POA: Diagnosis not present

## 2022-03-21 DIAGNOSIS — Z3A23 23 weeks gestation of pregnancy: Secondary | ICD-10-CM

## 2022-03-21 DIAGNOSIS — Z3689 Encounter for other specified antenatal screening: Secondary | ICD-10-CM | POA: Insufficient documentation

## 2022-03-21 DIAGNOSIS — O99412 Diseases of the circulatory system complicating pregnancy, second trimester: Secondary | ICD-10-CM | POA: Insufficient documentation

## 2022-03-21 DIAGNOSIS — O0990 Supervision of high risk pregnancy, unspecified, unspecified trimester: Secondary | ICD-10-CM

## 2022-03-21 DIAGNOSIS — F1991 Other psychoactive substance use, unspecified, in remission: Secondary | ICD-10-CM

## 2022-03-21 DIAGNOSIS — F333 Major depressive disorder, recurrent, severe with psychotic symptoms: Secondary | ICD-10-CM

## 2022-03-21 LAB — ECHOCARDIOGRAM COMPLETE
Area-P 1/2: 4.26 cm2
Calc EF: 72.3 %
Height: 62 in
S' Lateral: 2.6 cm
Single Plane A2C EF: 68.5 %
Single Plane A4C EF: 74.3 %
Weight: 2614.4 oz

## 2022-03-21 LAB — URINALYSIS, ROUTINE W REFLEX MICROSCOPIC
Bilirubin Urine: NEGATIVE
Glucose, UA: NEGATIVE mg/dL
Hgb urine dipstick: NEGATIVE
Ketones, ur: NEGATIVE mg/dL
Nitrite: NEGATIVE
Protein, ur: NEGATIVE mg/dL
Specific Gravity, Urine: 1.009 (ref 1.005–1.030)
pH: 7 (ref 5.0–8.0)

## 2022-03-21 LAB — GLUCOSE, CAPILLARY: Glucose-Capillary: 109 mg/dL — ABNORMAL HIGH (ref 70–99)

## 2022-03-21 LAB — CBC
HCT: 37.3 % (ref 36.0–46.0)
Hemoglobin: 12.4 g/dL (ref 12.0–15.0)
MCH: 25.8 pg — ABNORMAL LOW (ref 26.0–34.0)
MCHC: 33.2 g/dL (ref 30.0–36.0)
MCV: 77.5 fL — ABNORMAL LOW (ref 80.0–100.0)
Platelets: 301 10*3/uL (ref 150–400)
RBC: 4.81 MIL/uL (ref 3.87–5.11)
RDW: 13.2 % (ref 11.5–15.5)
WBC: 13.4 10*3/uL — ABNORMAL HIGH (ref 4.0–10.5)
nRBC: 0 % (ref 0.0–0.2)

## 2022-03-21 LAB — COMPREHENSIVE METABOLIC PANEL
ALT: 14 U/L (ref 0–44)
AST: 14 U/L — ABNORMAL LOW (ref 15–41)
Albumin: 3.4 g/dL — ABNORMAL LOW (ref 3.5–5.0)
Alkaline Phosphatase: 93 U/L (ref 38–126)
Anion gap: 9 (ref 5–15)
BUN: 7 mg/dL (ref 6–20)
CO2: 22 mmol/L (ref 22–32)
Calcium: 9.6 mg/dL (ref 8.9–10.3)
Chloride: 106 mmol/L (ref 98–111)
Creatinine, Ser: 0.53 mg/dL (ref 0.44–1.00)
GFR, Estimated: >60 mL/min (ref >60)
Glucose, Bld: 84 mg/dL (ref 70–99)
Potassium: 3.7 mmol/L (ref 3.5–5.1)
Sodium: 137 mmol/L (ref 135–145)
Total Bilirubin: 0.3 mg/dL (ref 0.3–1.2)
Total Protein: 6.8 g/dL (ref 6.5–8.1)

## 2022-03-21 LAB — BRAIN NATRIURETIC PEPTIDE: B Natriuretic Peptide: 15 pg/mL (ref 0.0–100.0)

## 2022-03-21 MED ORDER — CYCLOBENZAPRINE HCL 10 MG PO TABS
10.0000 mg | ORAL_TABLET | Freq: Three times a day (TID) | ORAL | 1 refills | Status: DC | PRN
  Filled 2022-03-21: qty 30, 10d supply, fill #0
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
## 2022-03-21 NOTE — MAU Note (Signed)
.  Janet Horn is a 21 y.o. at [redacted]w[redacted]d here in MAU. Transported from [facility] office via EMS. Reporting SOB, chills, generalized body aches (6/10), lower back pain (9/10), and loss of appetite that have been going on for about a week. Dizziness started four days ago. Reports having a HA (9/10) and blurry vision that started yesterday along with the dizziness. Took Tylenol yesterday and noticed a little improvement in her HA.  Denies VB or LOF. Reports scant spotting two days ago that went away. Reports normal FM.   Onset of complaint: past week Pain score: see note Vitals:   03/21/22 1603  BP: 117/60  Pulse: 84  Resp: 18  Temp: 98.5 F (36.9 C)  SpO2: 98%     FHT:145 Lab orders placed from triage:  UA

## 2022-03-21 NOTE — Patient Instructions (Signed)
Zoloft and Hydroxyzine are safe in pregnancy and we recommend maximizing therapy to help with depression symptom.

## 2022-03-21 NOTE — Progress Notes (Signed)
2:45 PM Called to room- through [place] message as patient was having worsening dizzy symptoms. Of note partner was in the room the entire time and was scrolling on phone and did not alert staff to the patient's concerns.  I immediately entered the room once I was alerted.   Patient was in mild distress. Reported the room was spinning and she was unable to stand up. Reported chest pain and nausea. Pain does not radiate.  Reports last food was in the lobby and was crackers. Patient does not eaten otherwise today.   She was here for a routine PNV and reported 1 week of worsening presyncopal events with SOB, orthopnea and I had referred to cardiology for these sx as they were concerning.   I performed in initial assessment and and called out to staff for support.   BP 112/70 Pulse ox remained in the high 90s to 100% HR 80-100s POC Glucose 109  Gen: mild distress. Unable to lay back. Intermittent somnolence but not LOC CV: RR. + stable murmur from prior assessment. 2+ peripheral pulses.  Lungs: CTAB Neck: no JVD present.    Patient has intermittent periods of increase somnolence but was arousable.  Offered water and patient able to sip   Recommended transport to the hospital due to symptoms.  Brita Romp CMA, called EMS.   2:55PM 120/82, 99% RA, HR 96 Awaiting arrival of EMS in room with patient, her partner and Nonah Mattes RN  3:05 PM 106/69 HR95 98% RA

## 2022-03-21 NOTE — Progress Notes (Signed)
  Echocardiogram 2D Echocardiogram has been performed.  Janalyn Harder 03/21/2022, 7:48 PM

## 2022-03-21 NOTE — Progress Notes (Signed)
   PRENATAL VISIT NOTE  Subjective:  Janet Horn is a 21 y.o. G2P0010 at [redacted]w[redacted]d being seen today for ongoing prenatal care.  She is currently monitored for the following issues for this high-risk pregnancy and has MDD (major depressive disorder), recurrent, severe, with psychosis (HCC); PTSD (post-traumatic stress disorder); Bipolar I disorder, most recent episode depressed (HCC); Borderline personality disorder (HCC); Adjustment disorder with mixed emotional features; Supervision of high risk pregnancy, antepartum; History of drug use; and 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  Patient reports "just not feeling well" Reports having 3-4 dizzy spells a day where she feels as though she will pass out. Endorses heart palpitations and SOB with events. This has been for about 1 week. Reports also she has been sleeping on 2 pillows because feels "she is drowning"   Contractions: Not present. Vag. Bleeding: None.  Movement: Present. Denies leaking of fluid.   The following portions of the patient's history were reviewed and updated as appropriate: allergies, current medications, past family history, past medical history, past social history, past surgical history and problem list.   Objective:   Vitals:   03/21/22 1323  BP: 121/79  Pulse: 76  Weight: 169 lb 6.4 oz (76.8 kg)    Fetal Status: Fetal Heart Rate (bpm): 144   Movement: Present     General:  Alert, oriented and cooperative. Patient is in no acute distress.  Skin: Skin is warm and dry. No rash noted.   Cardiovascular: Normal heart rate noted. 2/6 diastolic murmur heard best at LUSB. 2+ peripheral pulses.   Respiratory: Normal respiratory effort, no problems with respiration noted. No crackles  Abdomen: Soft, gravid, appropriate for gestational age.  Pain/Pressure: Present     Pelvic: Cervical exam deferred        Extremities: Normal range of motion.  Edema: None  Mental Status: Normal mood and affect. Normal behavior. Normal  judgment and thought content.   Assessment and Plan:  Pregnancy: G2P0010 at [redacted]w[redacted]d  1. Supervision of high risk pregnancy, antepartum TWG=26 lb 6.4 oz (12 kg) which is above goal Going to food market today COnfrimed with Misty Stanley that client has navigation and CM involved.   2. 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   3. MDD (major depressive disorder), recurrent, severe, with psychosis (HCC) Poor appetite, lack of motivation.  Sees pysch provider every Thursday  4. Presyncope - Concerning that patient has associated SOB, palpitations, fatigue and spontaneously reported orthopnea. Also heard a diastolic murmur on exam. No red flag sx today that would necessitate the  - Referred to [facility] cardiology  5. Back pain - accepted flexeril rx today  Preterm labor symptoms and general obstetric precautions including but not limited to vaginal bleeding, contractions, leaking of fluid and fetal movement were reviewed in detail with the patient. Please refer to After Visit Summary for other counseling recommendations.   Return in about 4 weeks (around 04/18/2022) for Routine prenatal care.  Future Appointments  Date Time Provider Department Center  04/11/2022  8:35 AM Federico Flake, MD [facility]  04/11/2022  8:50 AM [facility]  04/24/2022  1:35 PM Osborne Oman [facility]  05/10/2022  9:15 AM Venora Maples, MD [facility]    Federico Flake, MD

## 2022-03-25 ENCOUNTER — Other Ambulatory Visit (HOSPITAL_COMMUNITY): Payer: Self-pay

## 2022-03-26 ENCOUNTER — Other Ambulatory Visit (HOSPITAL_COMMUNITY): Payer: Self-pay

## 2022-03-26 MED ORDER — SERTRALINE HCL 25 MG PO TABS
25.0000 mg | ORAL_TABLET | Freq: Every morning | ORAL | 0 refills | Status: DC
  Filled 2022-03-26: qty 30, 30d supply, fill #0

## 2022-04-08 ENCOUNTER — Other Ambulatory Visit: Payer: Self-pay | Admitting: *Deleted

## 2022-04-08 DIAGNOSIS — O0990 Supervision of high risk pregnancy, unspecified, unspecified trimester: Secondary | ICD-10-CM

## 2022-04-09 ENCOUNTER — Other Ambulatory Visit (HOSPITAL_COMMUNITY): Payer: Self-pay

## 2022-04-09 MED ORDER — SERTRALINE HCL 25 MG PO TABS
25.0000 mg | ORAL_TABLET | Freq: Every day | ORAL | 0 refills | Status: DC
  Filled 2022-04-09: qty 30, 30d supply, fill #0

## 2022-04-09 MED ORDER — SERTRALINE HCL 25 MG PO TABS
ORAL_TABLET | ORAL | 0 refills | Status: DC
  Filled 2022-04-09: qty 30, 30d supply, fill #0

## 2022-04-11 ENCOUNTER — Ambulatory Visit (INDEPENDENT_AMBULATORY_CARE_PROVIDER_SITE_OTHER): Payer: Medicaid Other | Admitting: Family Medicine

## 2022-04-11 ENCOUNTER — Other Ambulatory Visit: Payer: Self-pay

## 2022-04-11 ENCOUNTER — Other Ambulatory Visit: Payer: Medicaid Other

## 2022-04-11 VITALS — BP 112/73 | HR 87 | Wt 168.6 lb

## 2022-04-11 DIAGNOSIS — F1991 Other psychoactive substance use, unspecified, in remission: Secondary | ICD-10-CM

## 2022-04-11 DIAGNOSIS — O0992 Supervision of high risk pregnancy, unspecified, second trimester: Secondary | ICD-10-CM | POA: Diagnosis not present

## 2022-04-11 DIAGNOSIS — O0990 Supervision of high risk pregnancy, unspecified, unspecified trimester: Secondary | ICD-10-CM

## 2022-04-11 DIAGNOSIS — Z23 Encounter for immunization: Secondary | ICD-10-CM

## 2022-04-11 DIAGNOSIS — Z3A26 26 weeks gestation of pregnancy: Secondary | ICD-10-CM

## 2022-04-11 NOTE — Progress Notes (Signed)
   PRENATAL VISIT NOTE  Subjective:  Janet Horn is a 21 y.o. G2P0010 at [redacted]w[redacted]d being seen today for ongoing prenatal care.  She is currently monitored for the following issues for this high-risk pregnancy and has MDD (major depressive disorder), recurrent, severe, with psychosis (HCC); PTSD (post-traumatic stress disorder); Bipolar I disorder, most recent episode depressed (HCC); Borderline personality disorder (HCC); Adjustment disorder with mixed emotional features; Supervision of high risk pregnancy, antepartum; History of drug use; and 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  Patient reports no complaints.  Contractions: Not present. Vag. Bleeding: None.  Movement: Present. Denies leaking of fluid.   The following portions of the patient's history were reviewed and updated as appropriate: allergies, current medications, past family history, past medical history, past social history, past surgical history and problem list.   Objective:   Vitals:   04/11/22 0838  BP: 112/73  Pulse: 87  Weight: 168 lb 9.6 oz (76.5 kg)    Fetal Status: Fetal Heart Rate (bpm): 148   Movement: Present     General:  Alert, oriented and cooperative. Patient is in no acute distress.  Skin: Skin is warm and dry. No rash noted.   Cardiovascular: Normal heart rate noted  Respiratory: Normal respiratory effort, no problems with respiration noted  Abdomen: Soft, gravid, appropriate for gestational age.  Pain/Pressure: Absent     Pelvic: Cervical exam deferred        Extremities: Normal range of motion.  Edema: None  Mental Status: Normal mood and affect. Normal behavior. Normal judgment and thought content.   Assessment and Plan:  Pregnancy: G2P0010 at [redacted]w[redacted]d 1. Supervision of high risk pregnancy, antepartum Doing "much better today" Getting 3rd trimester labs TWG= 25 lb 9.6 oz (11.6 kg)  Desires epidural in labor Plans to BF, has pump at home - Tdap vaccine greater than or equal to 7yo IM  2.  History of drug use Sober for 3 years Coping with stress OK  3. Palpitations/SOB - Has Cardiology follow up - Extensive work up in MAU on 6/8 thankfully negative  4. HSV - on suppression currently, does report 1 lesion in the labial folds - Discussed that if lesions are present at delivery or outbreak within 1 month of delivery she would need to have pLTCS. Voiced understanding  Preterm labor symptoms and general obstetric precautions including but not limited to vaginal bleeding, contractions, leaking of fluid and fetal movement were reviewed in detail with the patient. Please refer to After Visit Summary for other counseling recommendations.   Return in about 2 weeks (around 04/25/2022) for Routine prenatal care.  Future Appointments  Date Time Provider Department Center  04/24/2022  1:35 PM Osborne Oman [facility]  04/26/2022  3:20 PM Meriam Sprague, MD [facility] None  05/10/2022  9:15 AM Venora Maples, MD [facility]  05/23/2022  3:15 PM Federico Flake, MD [facility]  06/07/2022 10:55 AM Venora Maples, MD [facility]    Federico Flake, MD

## 2022-04-12 LAB — RPR: RPR Ser Ql: NONREACTIVE

## 2022-04-12 LAB — GLUCOSE TOLERANCE, 2 HOURS W/ 1HR
Glucose, 1 hour: 131 mg/dL (ref 70–179)
Glucose, 2 hour: 88 mg/dL (ref 70–152)
Glucose, Fasting: 110 mg/dL — ABNORMAL HIGH (ref 70–91)

## 2022-04-12 LAB — CBC
Hematocrit: 32.2 % — ABNORMAL LOW (ref 34.0–46.6)
Hemoglobin: 10.7 g/dL — ABNORMAL LOW (ref 11.1–15.9)
MCH: 25.5 pg — ABNORMAL LOW (ref 26.6–33.0)
MCHC: 33.2 g/dL (ref 31.5–35.7)
MCV: 77 fL — ABNORMAL LOW (ref 79–97)
Platelets: 315 10*3/uL (ref 150–450)
RBC: 4.19 x10E6/uL (ref 3.77–5.28)
RDW: 12.4 % (ref 11.7–15.4)
WBC: 9 10*3/uL (ref 3.4–10.8)

## 2022-04-12 LAB — HIV ANTIBODY (ROUTINE TESTING W REFLEX): HIV Screen 4th Generation wRfx: NONREACTIVE

## 2022-04-13 ENCOUNTER — Encounter: Payer: Self-pay | Admitting: Family Medicine

## 2022-04-15 ENCOUNTER — Other Ambulatory Visit (HOSPITAL_COMMUNITY): Payer: Self-pay

## 2022-04-15 ENCOUNTER — Other Ambulatory Visit: Payer: Self-pay

## 2022-04-15 MED ORDER — FERROUS SULFATE 325 (65 FE) MG PO TABS
325.0000 mg | ORAL_TABLET | ORAL | 3 refills | Status: DC
  Filled 2022-04-15: qty 30, 60d supply, fill #0

## 2022-04-15 NOTE — Progress Notes (Signed)
Orders sent for Rx Iron per Dr Alvester Morin for anemia.  Judeth Cornfield, RN

## 2022-04-23 ENCOUNTER — Other Ambulatory Visit: Payer: Self-pay

## 2022-04-23 DIAGNOSIS — O0990 Supervision of high risk pregnancy, unspecified, unspecified trimester: Secondary | ICD-10-CM

## 2022-04-24 ENCOUNTER — Encounter: Payer: Medicaid Other | Admitting: Certified Nurse Midwife

## 2022-04-24 ENCOUNTER — Other Ambulatory Visit: Payer: Self-pay

## 2022-04-24 ENCOUNTER — Other Ambulatory Visit: Payer: Medicaid Other

## 2022-04-24 ENCOUNTER — Ambulatory Visit (INDEPENDENT_AMBULATORY_CARE_PROVIDER_SITE_OTHER): Payer: Medicaid Other | Admitting: Family Medicine

## 2022-04-24 VITALS — BP 117/77 | HR 89 | Wt 170.8 lb

## 2022-04-24 DIAGNOSIS — O0990 Supervision of high risk pregnancy, unspecified, unspecified trimester: Secondary | ICD-10-CM

## 2022-04-24 DIAGNOSIS — F1991 Other psychoactive substance use, unspecified, in remission: Secondary | ICD-10-CM
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
## 2022-04-25 ENCOUNTER — Other Ambulatory Visit: Payer: Self-pay

## 2022-04-25 ENCOUNTER — Other Ambulatory Visit (HOSPITAL_COMMUNITY): Payer: Self-pay

## 2022-04-25 ENCOUNTER — Telehealth: Payer: Self-pay

## 2022-04-25 ENCOUNTER — Encounter: Payer: Self-pay | Admitting: Family Medicine

## 2022-04-25 DIAGNOSIS — O24419 Gestational diabetes mellitus in pregnancy, unspecified control: Secondary | ICD-10-CM

## 2022-04-25 LAB — GLUCOSE TOLERANCE, 2 HOURS W/ 1HR
Glucose, 1 hour: 155 mg/dL (ref 70–179)
Glucose, 2 hour: 126 mg/dL (ref 70–152)
Glucose, Fasting: 98 mg/dL — ABNORMAL HIGH (ref 70–91)

## 2022-04-25 MED ORDER — GLUCOSE BLOOD VI STRP
ORAL_STRIP | 12 refills | Status: DC
  Filled 2022-04-25: qty 100, 25d supply, fill #0

## 2022-04-25 MED ORDER — ACCU-CHEK SOFTCLIX LANCETS MISC: 12 refills | Status: DC

## 2022-04-25 MED ORDER — ACCU-CHEK SOFTCLIX LANCETS MISC
12 refills | Status: DC
  Filled 2022-04-25: qty 100, 25d supply, fill #0

## 2022-04-25 MED ORDER — ACCU-CHEK GUIDE W/DEVICE KIT: 1.0000 | PACK | Freq: Four times a day (QID) | 0 refills | Status: DC

## 2022-04-25 MED ORDER — ACCU-CHEK GUIDE W/DEVICE KIT
1.0000 | PACK | Freq: Four times a day (QID) | 0 refills | Status: DC
  Filled 2022-04-25: qty 1, 30d supply, fill #0

## 2022-04-25 MED ORDER — ACCU-CHEK GUIDE VI STRP
ORAL_STRIP | 12 refills | Status: AC
Start: 2022-04-25 — End: ?

## 2022-04-25 NOTE — Telephone Encounter (Signed)
Diabetes supplies sent to pharmacy on file. Pt aware and called office and spoke with Addison Naegeli, RN for different pharmacy change due to transportation needs. Diabetes Ed appt 7/18 at 1115am. Pt aware.  Laney Pastor

## 2022-04-25 NOTE — Telephone Encounter (Signed)
-----   Message from Federico Flake, MD sent at 04/25/2022  7:06 AM EDT ----- Patient meets criteria for GDM. Please send in referral to DM education, glucometer/strips/lancets

## 2022-04-25 NOTE — Telephone Encounter (Signed)
Pt called this am with concern about her receiving GDM results from Dr. Alvester Morin.  I explained to the need for diabetes education that pt agreed to coming on July 18th @ 1115.  Pt asks if we are able to send it to her as she has transportation concerns.  I informed pt that I will send prescription to [facility] as she will be able to set up delivery service.  Pt verbalized understanding with no further questions.  Addison Naegeli, RN  04/25/22

## 2022-04-26 ENCOUNTER — Encounter: Payer: Self-pay | Admitting: Cardiology

## 2022-04-26 ENCOUNTER — Ambulatory Visit: Payer: Medicaid Other | Admitting: Cardiology

## 2022-04-29 ENCOUNTER — Other Ambulatory Visit: Payer: Medicaid Other

## 2022-04-30 ENCOUNTER — Encounter: Payer: Medicaid Other | Admitting: Registered"

## 2022-04-30 ENCOUNTER — Other Ambulatory Visit: Payer: Self-pay

## 2022-04-30 ENCOUNTER — Encounter: Payer: Medicaid Other | Attending: Family Medicine | Admitting: Registered"

## 2022-04-30 ENCOUNTER — Ambulatory Visit (INDEPENDENT_AMBULATORY_CARE_PROVIDER_SITE_OTHER): Payer: Medicaid Other | Admitting: Registered"

## 2022-04-30 DIAGNOSIS — Z713 Dietary counseling and surveillance: Secondary | ICD-10-CM | POA: Insufficient documentation

## 2022-04-30 DIAGNOSIS — Z8632 Personal history of gestational diabetes: Secondary | ICD-10-CM | POA: Insufficient documentation

## 2022-04-30 DIAGNOSIS — O24410 Gestational diabetes mellitus in pregnancy, diet controlled: Secondary | ICD-10-CM | POA: Insufficient documentation

## 2022-04-30 DIAGNOSIS — Z3A00 Weeks of gestation of pregnancy not specified: Secondary | ICD-10-CM | POA: Insufficient documentation

## 2022-04-30 DIAGNOSIS — O24419 Gestational diabetes mellitus in pregnancy, unspecified control: Secondary | ICD-10-CM | POA: Insufficient documentation

## 2022-04-30 NOTE — Progress Notes (Signed)
   PRENATAL VISIT NOTE  Subjective:  Janet Horn is a 21 y.o. G2P0010 at 28w4 being seen today for ongoing prenatal care.  She is currently monitored for the following issues for this high-risk pregnancy and has MDD (major depressive disorder), recurrent, severe, with psychosis (HCC); PTSD (post-traumatic stress disorder); Bipolar I disorder, most recent episode depressed (HCC); Borderline personality disorder (HCC); Adjustment disorder with mixed emotional features; Supervision of high risk pregnancy, antepartum; History of drug use; and 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  Patient reports no complaints.  Contractions: Irritability. Vag. Bleeding: None.  Movement: Present. Denies leaking of fluid.   The following portions of the patient's history were reviewed and updated as appropriate: allergies, current medications, past family history, past medical history, past social history, past surgical history and problem list.   Objective:   Vitals:   04/24/22 1040  BP: 117/77  Pulse: 89  Weight: 170 lb 12.8 oz (77.5 kg)    Fetal Status: Fetal Heart Rate (bpm): 141 Fundal Height: 28 cm Movement: Present     General:  Alert, oriented and cooperative. Patient is in no acute distress.  Skin: Skin is warm and dry. No rash noted.   Cardiovascular: Normal heart rate noted  Respiratory: Normal respiratory effort, no problems with respiration noted  Abdomen: Soft, gravid, appropriate for gestational age.  Pain/Pressure: Present     Pelvic: Cervical exam deferred        Extremities: Normal range of motion.  Edema: None  Mental Status: Normal mood and affect. Normal behavior. Normal judgment and thought content.   Assessment and Plan:  Pregnancy: G2P0010 at [redacted]w[redacted]d 1. Supervision of high risk pregnancy, antepartum Repeating testing today for 28 wks labs after it was found she had eaten prior to last draw She did vomit today but AFTER the last blood draw.  Follow up results TWG=27 lb 12.8  oz (12.6 kg) which is above goal She has cardiology fu scheduled for her episode in clinic where I sent her to the MAU  2. History of drug use Remains in remission   Preterm labor symptoms and general obstetric precautions including but not limited to vaginal bleeding, contractions, leaking of fluid and fetal movement were reviewed in detail with the patient. Please refer to After Visit Summary for other counseling recommendations.   Return in about 2 weeks (around 05/08/2022) for Routine prenatal care.  Future Appointments  Date Time Provider Department Center  05/10/2022  9:15 AM Venora Maples, MD [facility]  05/23/2022  3:15 PM Federico Flake, MD [facility]  06/07/2022 10:55 AM Venora Maples, MD [facility]  06/21/2022 10:15 AM Venora Maples, MD [facility]  06/28/2022 11:15 AM Venora Maples, MD [facility]  07/05/2022  9:55 AM Kathlene Cote [facility]  07/12/2022 10:55 AM Venora Maples, MD [facility]    Federico Flake, MD

## 2022-04-30 NOTE — Progress Notes (Signed)
Patient was seen for Gestational Diabetes self-management on 04/30/22  Start time 1115 and End time 1210   Estimated due date: 07/13/22; [redacted]w[redacted]d  Clinical: Medications: reviewed Medical History: reviewed Labs: OGTT 98, 155, 126; A1c 5.7% 11 mo ago; 5.3% 4 mo ago   Dietary and Lifestyle History: Pt states she has sporadic eating habits. Sometimes is afraid to eat much because of fear of gaining weight. Other times when depressed may eat too much. Pt states she has had some childhood experiences around food that make it hard to eat some specific foods.   Pt reports since dx has switched to zero-beverages. Pt states she enjoys milk but usually just drinks with some meals, maybe no-sugar cookies.   Physical Activity: ADL limited due to low energy level and discomfort with walking Stress: not assessed Sleep: not assessed  24 hr Recall: not assessed  NUTRITION INTERVENTION  Nutrition education (E-1) on the following topics:   Initial Follow-up  [x]  []  Definition of Gestational Diabetes [x]  []  Why dietary management is important in controlling blood glucose [x]  []  Effects each nutrient has on blood glucose levels [x]  []  Simple carbohydrates vs complex carbohydrates [x]  []  Fluid intake [x]  []  Creating a balanced meal plan [x]  []  Carbohydrate counting  [x]  []  When to check blood glucose levels [x]  []  Proper blood glucose monitoring techniques [x]  []  Effect of stress and stress reduction techniques  [x]  []  Exercise effect on blood glucose levels, appropriate exercise during pregnancy [x]  []  Importance of limiting caffeine and abstaining from alcohol and smoking [x]  []  Medications used for blood sugar control during pregnancy [x]  []  Hypoglycemia and rule of 15 []  []  Postpartum self care  Blood glucose monitor given: not assessed Patient brought has a meter for instruction. CBG: 109 mg/dL  Patient instructed to monitor glucose levels: FBS: 60 - ? 95 mg/dL  2 hour: ?  mg/dL  Patient received handouts: Nutrition Diabetes and Pregnancy Carbohydrate Counting List  Patient will be seen for follow-up as needed.

## 2022-05-02 ENCOUNTER — Other Ambulatory Visit: Payer: Self-pay | Admitting: Lactation Services

## 2022-05-02 DIAGNOSIS — O24410 Gestational diabetes mellitus in pregnancy, diet controlled: Secondary | ICD-10-CM

## 2022-05-07 ENCOUNTER — Other Ambulatory Visit (HOSPITAL_COMMUNITY): Payer: Self-pay

## 2022-05-07 MED ORDER — HYDROXYZINE HCL 25 MG PO TABS
ORAL_TABLET | ORAL | 0 refills | Status: DC
  Filled 2022-05-07: qty 30, 30d supply, fill #0

## 2022-05-07 MED ORDER — SERTRALINE HCL 25 MG PO TABS
ORAL_TABLET | ORAL | 0 refills | Status: DC
  Filled 2022-05-07: qty 30, 30d supply, fill #0

## 2022-05-09 ENCOUNTER — Encounter: Payer: Medicaid Other | Admitting: Family Medicine

## 2022-05-10 ENCOUNTER — Ambulatory Visit (INDEPENDENT_AMBULATORY_CARE_PROVIDER_SITE_OTHER): Payer: Medicaid Other | Admitting: Family Medicine

## 2022-05-10 ENCOUNTER — Other Ambulatory Visit (HOSPITAL_COMMUNITY): Payer: Self-pay

## 2022-05-10 ENCOUNTER — Other Ambulatory Visit: Payer: Self-pay

## 2022-05-10 ENCOUNTER — Encounter: Payer: Self-pay | Admitting: Family Medicine

## 2022-05-10 VITALS — BP 118/74 | HR 88 | Wt 172.6 lb

## 2022-05-10 DIAGNOSIS — O24410 Gestational diabetes mellitus in pregnancy, diet controlled: Secondary | ICD-10-CM

## 2022-05-10 DIAGNOSIS — O9279 Other disorders of lactation: Secondary | ICD-10-CM

## 2022-05-10 DIAGNOSIS — F1991 Other psychoactive substance use, unspecified, in remission: Secondary | ICD-10-CM

## 2022-05-10 DIAGNOSIS — A6004 Herpesviral vulvovaginitis: Secondary | ICD-10-CM

## 2022-05-10 DIAGNOSIS — O0990 Supervision of high risk pregnancy, unspecified, unspecified trimester: Secondary | ICD-10-CM

## 2022-05-10 MED ORDER — VALACYCLOVIR HCL 500 MG PO TABS
500.0000 mg | ORAL_TABLET | Freq: Two times a day (BID) | ORAL | 1 refills | Status: DC
  Filled 2022-05-10: qty 60, 30d supply, fill #0

## 2022-05-10 NOTE — Progress Notes (Signed)
   Subjective:  [name] Kathelene Rumberger is a 21 y.o. G2P0010 at [redacted]w[redacted]d being seen today for ongoing prenatal care.  She is currently monitored for the following issues for this high-risk pregnancy and has MDD (major depressive disorder), recurrent, severe, with psychosis (HCC); PTSD (post-traumatic stress disorder); Bipolar I disorder, most recent episode depressed (HCC); Borderline personality disorder (HCC); Adjustment disorder with mixed emotional features; Supervision of high risk pregnancy, antepartum; History of drug use; Genital HSV; and Gestational diabetes mellitus (GDM) in third trimester on their problem list.  Patient reports  Left nipple swelling .  Contractions: Not present. Vag. Bleeding: None.  Movement: Present. Denies leaking of fluid.  L breast: has some crust on her left nipple, followed by swelling and pain. No nipple discharge. Started about 2-3 days ago. No concerns in R breast.   The following portions of the patient's history were reviewed and updated as appropriate: allergies, current medications, past family history, past medical history, past social history, past surgical history and problem list. Problem list updated.  Objective:   Vitals:   05/10/22 0924  BP: 118/74  Pulse: 88  Weight: 172 lb 9.6 oz (78.3 kg)    Fetal Status: Fetal Heart Rate (bpm): 139 Fundal Height: 33 cm Movement: Present     General:  Alert, oriented and cooperative. Patient is in no acute distress.  Skin: Skin is warm and dry. No rash noted.   Cardiovascular: Normal heart rate noted  Respiratory: Normal respiratory effort, no problems with respiration noted  Abdomen: Soft, gravid, appropriate for gestational age. Pain/Pressure: Present     Pelvic: Vag. Bleeding: None     Cervical exam deferred        Extremities: Normal range of motion.  Edema: None  Mental Status: Normal mood and affect. Normal behavior. Normal judgment and thought content.   Urinalysis:      Assessment and Plan:   Pregnancy: G2P0010 at [redacted]w[redacted]d  1. Supervision of high risk pregnancy, antepartum BP and FHR normal Reports some nipple discharge, on exam starting to develop colostrum, mild breast engorgement, conservative treatment  2. History of drug use Prior history of OUD, abstinence for several years now  3. Herpes simplex vulvovaginitis Has had recent outbreak Start PPX now and continue till delivery.  4. Diet controlled gestational diabetes mellitus (GDM) in third trimester Forgot log On recall sugars are 90s -100s. Single FBG in the 160s. Hasn't recurred. Needs follow up growth, will schedule today  Preterm labor symptoms and general obstetric precautions including but not limited to vaginal bleeding, contractions, leaking of fluid and fetal movement were reviewed in detail with the patient. Please refer to After Visit Summary for other counseling recommendations.   Return in 2 weeks (on 05/24/2022) for Dyad patient, ob visit.   Ervin Rothbauer, Nadene Rubins, MD OB Fellow, Faculty Practice.

## 2022-05-10 NOTE — Patient Instructions (Signed)

## 2022-05-21 ENCOUNTER — Telehealth: Payer: Self-pay

## 2022-05-22 NOTE — Progress Notes (Signed)
    PRENATAL VISIT NOTE  Subjective:  Janet Horn is a 21 y.o. G2P0010 at [redacted]w[redacted]d being seen today for ongoing prenatal care.  She is currently monitored for the following issues for this low-risk pregnancy and has MDD (major depressive disorder), recurrent, severe, with psychosis (HCC); PTSD (post-traumatic stress disorder); Bipolar I disorder, most recent episode depressed (HCC); Borderline personality disorder (HCC); Adjustment disorder with mixed emotional features; Supervision of high risk pregnancy, antepartum; History of drug use; Genital HSV; and Gestational diabetes mellitus (GDM) in third trimester on their problem list.  Patient reports no complaints.  Contractions: Irritability. Vag. Bleeding: None.  Movement: Present. Denies leaking of fluid.   The following portions of the patient's history were reviewed and updated as appropriate: allergies, current medications, past family history, past medical history, past social history, past surgical history and problem list.   Objective:   Vitals:   05/23/22 1551  BP: 115/79  Pulse: 93  Weight: 174 lb 3.2 oz (79 kg)    Fetal Status: Fetal Heart Rate (bpm): 143   Movement: Present     General:  Alert, oriented and cooperative. Patient is in no acute distress.  Skin: Skin is warm and dry. No rash noted.   Cardiovascular: Normal heart rate noted  Respiratory: Normal respiratory effort, no problems with respiration noted  Abdomen: Soft, gravid, appropriate for gestational age.  Pain/Pressure: Absent     Pelvic: Cervical exam deferred        Extremities: Normal range of motion.  Edema: None  Mental Status: Normal mood and affect. Normal behavior. Normal judgment and thought content.   Assessment and Plan:  Pregnancy: G2P0010 at [redacted]w[redacted]d 1. Supervision of high risk pregnancy, antepartum Back pain is stable Having family strife with mother-- she and partner are on the list of low income housing and mother is talking about it  with the patient and stressing her out.   2. MDD (major depressive disorder), recurrent, severe, with psychosis (HCC) Stable Has therapist Reports worsening anxiety sx due to strife with mother Declined dose adjustment on medications today  3. Diet controlled gestational diabetes mellitus (GDM) in third trimester Reviewed log Fastings 60-90- have hypoglycemia sx with low BG.  PP 93-121 Encouraged regular eating and increase frequency to every 2-3 hours TWG=31 lb 3.2 oz (14.2 kg)   4. Herpes simplex vulvovaginitis PPX at 36 wks  Preterm labor symptoms and general obstetric precautions including but not limited to vaginal bleeding, contractions, leaking of fluid and fetal movement were reviewed in detail with the patient. Please refer to After Visit Summary for other counseling recommendations.   Return in about 2 weeks (around 06/06/2022) for Routine prenatal care.  Future Appointments  Date Time Provider Department Center  06/05/2022  3:05 PM [facility] E[facility] 5 [id number]  06/07/2022 10:55 AM Venora Maples, MD [facility]  06/21/2022 10:15 AM Venora Maples, MD [facility]  06/28/2022 11:15 AM Venora Maples, MD [facility]  07/05/2022  9:55 AM Kathlene Cote [facility]  07/12/2022 10:55 AM Venora Maples, MD [facility]  07/15/2022 11:15 AM [facility]  08/09/2022  2:00 PM Tobb, Lavona Mound, DO [facility] None    Federico Flake, MD

## 2022-05-23 ENCOUNTER — Ambulatory Visit (INDEPENDENT_AMBULATORY_CARE_PROVIDER_SITE_OTHER): Payer: Medicaid Other | Admitting: Family Medicine

## 2022-05-23 ENCOUNTER — Other Ambulatory Visit (HOSPITAL_COMMUNITY)
Admission: RE | Admit: 2022-05-23 | Discharge: 2022-05-23 | Disposition: A | Discharge status: Home / Self Care | Payer: Medicaid Other | Source: Ambulatory Visit | Attending: Family Medicine | Admitting: Family Medicine

## 2022-05-23 ENCOUNTER — Encounter: Payer: Self-pay | Admitting: Family Medicine

## 2022-05-23 VITALS — BP 115/79 | HR 93 | Wt 174.2 lb

## 2022-05-23 DIAGNOSIS — N898 Other specified noninflammatory disorders of vagina: Secondary | ICD-10-CM | POA: Insufficient documentation

## 2022-05-23 DIAGNOSIS — O0990 Supervision of high risk pregnancy, unspecified, unspecified trimester: Secondary | ICD-10-CM

## 2022-05-23 DIAGNOSIS — O26893 Other specified pregnancy related conditions, third trimester: Secondary | ICD-10-CM | POA: Diagnosis present

## 2022-05-23 DIAGNOSIS — F1991 Other psychoactive substance use, unspecified, in remission: Secondary | ICD-10-CM

## 2022-05-23 DIAGNOSIS — F333 Major depressive disorder, recurrent, severe with psychotic symptoms: Secondary | ICD-10-CM

## 2022-05-23 DIAGNOSIS — B9689 Other specified bacterial agents as the cause of diseases classified elsewhere: Secondary | ICD-10-CM

## 2022-05-23 DIAGNOSIS — N760 Acute vaginitis: Secondary | ICD-10-CM

## 2022-05-23 DIAGNOSIS — O24410 Gestational diabetes mellitus in pregnancy, diet controlled: Secondary | ICD-10-CM

## 2022-05-23 DIAGNOSIS — A6004 Herpesviral vulvovaginitis: Secondary | ICD-10-CM

## 2022-05-24 ENCOUNTER — Ambulatory Visit (INDEPENDENT_AMBULATORY_CARE_PROVIDER_SITE_OTHER): Payer: Medicaid Other | Admitting: Cardiology

## 2022-05-24 ENCOUNTER — Encounter: Payer: Self-pay | Admitting: Cardiology

## 2022-05-24 ENCOUNTER — Encounter: Payer: Self-pay | Admitting: Family Medicine

## 2022-05-24 ENCOUNTER — Ambulatory Visit: Payer: Medicaid Other | Attending: Family Medicine

## 2022-05-24 ENCOUNTER — Other Ambulatory Visit (HOSPITAL_COMMUNITY): Payer: Self-pay

## 2022-05-24 ENCOUNTER — Ambulatory Visit: Payer: Medicaid Other | Admitting: *Deleted

## 2022-05-24 VITALS — BP 115/72 | HR 98

## 2022-05-24 VITALS — BP 116/82 | HR 108 | Ht 62.0 in | Wt 174.7 lb

## 2022-05-24 DIAGNOSIS — D563 Thalassemia minor: Secondary | ICD-10-CM

## 2022-05-24 DIAGNOSIS — A749 Chlamydial infection, unspecified: Secondary | ICD-10-CM

## 2022-05-24 DIAGNOSIS — O99413 Diseases of the circulatory system complicating pregnancy, third trimester: Secondary | ICD-10-CM

## 2022-05-24 DIAGNOSIS — O285 Abnormal chromosomal and genetic finding on antenatal screening of mother: Secondary | ICD-10-CM

## 2022-05-24 DIAGNOSIS — I38 Endocarditis, valve unspecified: Secondary | ICD-10-CM | POA: Diagnosis not present

## 2022-05-24 DIAGNOSIS — O98313 Other infections with a predominantly sexual mode of transmission complicating pregnancy, third trimester: Secondary | ICD-10-CM

## 2022-05-24 DIAGNOSIS — R0602 Shortness of breath: Secondary | ICD-10-CM

## 2022-05-24 DIAGNOSIS — R011 Cardiac murmur, unspecified: Secondary | ICD-10-CM | POA: Diagnosis not present

## 2022-05-24 DIAGNOSIS — O98513 Other viral diseases complicating pregnancy, third trimester: Secondary | ICD-10-CM

## 2022-05-24 DIAGNOSIS — F332 Major depressive disorder, recurrent severe without psychotic features: Secondary | ICD-10-CM

## 2022-05-24 DIAGNOSIS — F1991 Other psychoactive substance use, unspecified, in remission: Secondary | ICD-10-CM | POA: Insufficient documentation

## 2022-05-24 DIAGNOSIS — O0990 Supervision of high risk pregnancy, unspecified, unspecified trimester: Secondary | ICD-10-CM

## 2022-05-24 DIAGNOSIS — O24410 Gestational diabetes mellitus in pregnancy, diet controlled: Secondary | ICD-10-CM

## 2022-05-24 DIAGNOSIS — B009 Herpesviral infection, unspecified: Secondary | ICD-10-CM

## 2022-05-24 DIAGNOSIS — Z3A32 32 weeks gestation of pregnancy: Secondary | ICD-10-CM

## 2022-05-24 DIAGNOSIS — O99343 Other mental disorders complicating pregnancy, third trimester: Secondary | ICD-10-CM

## 2022-05-24 LAB — CERVICOVAGINAL ANCILLARY ONLY
Bacterial Vaginitis (gardnerella): POSITIVE — AB
Candida Glabrata: NEGATIVE
Candida Vaginitis: NEGATIVE
Chlamydia: NEGATIVE
Comment: NEGATIVE
Comment: NEGATIVE
Comment: NEGATIVE
Comment: NEGATIVE
Comment: NEGATIVE
Comment: NORMAL
Neisseria Gonorrhea: NEGATIVE
Trichomonas: NEGATIVE

## 2022-05-24 MED ORDER — METRONIDAZOLE 500 MG PO TABS
500.0000 mg | ORAL_TABLET | Freq: Two times a day (BID) | ORAL | 0 refills | Status: DC
  Filled 2022-05-24: qty 14, 7d supply, fill #0

## 2022-05-24 NOTE — Patient Instructions (Addendum)
Medication Instructions:  Your physician recommends that you continue on your current medications as directed. Please refer to the Current Medication list given to you today.  *If you need a refill on your cardiac medications before your next appointment, please call your pharmacy*   Lab Work: None If you have labs (blood work) drawn today and your tests are completely normal, you will receive your results only by: MyChart Message (if you have MyChart) OR A paper copy in the mail If you have any lab test that is abnormal or we need to change your treatment, we will call you to review the results.   Testing/Procedures: Your physician has requested that you have an echocardiogram within next 2 weeks. Echocardiography is a painless test that uses sound waves to create images of your heart. It provides your doctor with information about the size and shape of your heart and how well your heart's chambers and valves are working. This procedure takes approximately one hour. There are no restrictions for this procedure.    Follow-Up: At [facility], you and your health needs are our priority.  As part of our continuing mission to provide you with exceptional heart care, we have created designated Provider Care Teams.  These Care Teams include your primary Cardiologist (physician) and Advanced Practice Providers (APPs -  Physician Assistants and Nurse Practitioners) who all work together to provide you with the care you need, when you need it.  We recommend signing up for the patient portal called "MyChart".  Sign up information is provided on this After Visit Summary.  MyChart is used to connect with patients for Virtual Visits (Telemedicine).  Patients are able to view lab/test results, encounter notes, upcoming appointments, etc.  Non-urgent messages can be sent to your provider as well.   To learn more about what you can do with MyChart, go to [email or web address].    Your next appointment:    12 week(s)  The format for your next appointment:   In Person  Provider:   Thomasene Ripple  [facility] [address], [place], [place] [address]     Other Instructions   Important Information About Sugar
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
## 2022-06-05 ENCOUNTER — Ambulatory Visit (HOSPITAL_COMMUNITY)
Admission: RE | Admit: 2022-06-05 | Discharge: 2022-06-05 | Disposition: A | Discharge status: Home / Self Care | Payer: Medicaid Other | Source: Ambulatory Visit | Attending: Cardiovascular Disease | Admitting: Cardiovascular Disease

## 2022-06-05 DIAGNOSIS — R011 Cardiac murmur, unspecified: Secondary | ICD-10-CM

## 2022-06-05 DIAGNOSIS — R0609 Other forms of dyspnea: Secondary | ICD-10-CM | POA: Diagnosis not present

## 2022-06-05 DIAGNOSIS — I38 Endocarditis, valve unspecified: Secondary | ICD-10-CM | POA: Insufficient documentation

## 2022-06-05 LAB — ECHOCARDIOGRAM COMPLETE
Area-P 1/2: 4.21 cm2
S' Lateral: 2.8 cm

## 2022-06-07 ENCOUNTER — Encounter: Payer: Self-pay | Admitting: Family Medicine

## 2022-06-07 ENCOUNTER — Ambulatory Visit (INDEPENDENT_AMBULATORY_CARE_PROVIDER_SITE_OTHER): Payer: Medicaid Other | Admitting: Family Medicine

## 2022-06-07 ENCOUNTER — Other Ambulatory Visit: Payer: Self-pay

## 2022-06-07 VITALS — BP 110/71 | HR 89 | Wt 176.3 lb

## 2022-06-07 DIAGNOSIS — O0990 Supervision of high risk pregnancy, unspecified, unspecified trimester: Secondary | ICD-10-CM

## 2022-06-07 DIAGNOSIS — F333 Major depressive disorder, recurrent, severe with psychotic symptoms: Secondary | ICD-10-CM

## 2022-06-07 DIAGNOSIS — A6004 Herpesviral vulvovaginitis: Secondary | ICD-10-CM

## 2022-06-07 DIAGNOSIS — O24410 Gestational diabetes mellitus in pregnancy, diet controlled: Secondary | ICD-10-CM

## 2022-06-07 NOTE — Progress Notes (Signed)
   Subjective:  Janet Horn is a 21 y.o. G2P0010 at [redacted]w[redacted]d being seen today for ongoing prenatal care.  She is currently monitored for the following issues for this high-risk pregnancy and has MDD (major depressive disorder), recurrent, severe, with psychosis (HCC); PTSD (post-traumatic stress disorder); Bipolar I disorder, most recent episode depressed (HCC); Borderline personality disorder (HCC); Adjustment disorder with mixed emotional features; Supervision of high risk pregnancy, antepartum; History of drug use; Genital HSV; and Gestational diabetes mellitus (GDM) in third trimester on their problem list.  Patient reports  difficulty sleeping .  Contractions: Irritability. Vag. Bleeding: None.  Movement: Present. Denies leaking of fluid.   The following portions of the patient's history were reviewed and updated as appropriate: allergies, current medications, past family history, past medical history, past social history, past surgical history and problem list. Problem list updated.  Objective:   Vitals:   06/07/22 1113  BP: 110/71  Pulse: 89  Weight: 176 lb 4.8 oz (80 kg)    Fetal Status: Fetal Heart Rate (bpm): 143   Movement: Present     General:  Alert, oriented and cooperative. Patient is in no acute distress.  Skin: Skin is warm and dry. No rash noted.   Cardiovascular: Normal heart rate noted  Respiratory: Normal respiratory effort, no problems with respiration noted  Abdomen: Soft, gravid, appropriate for gestational age. Pain/Pressure: Present     Pelvic: Vag. Bleeding: None     Cervical exam deferred        Extremities: Normal range of motion.  Edema: Trace  Mental Status: Normal mood and affect. Normal behavior. Normal judgment and thought content.   Urinalysis:      Assessment and Plan:  Pregnancy: G2P0010 at [redacted]w[redacted]d  1. Supervision of high risk pregnancy, antepartum BP and FHR normal Swabs next visit  2. Diet controlled gestational diabetes mellitus  (GDM) in third trimester One post prandial at 122, otherwise 100% at goal Most recent US w EFW 37%, AFI 11.5, this was on 05/24/2022  3. Herpes simplex vulvovaginitis Taking ppx, no recent outbreaks  4. MDD (major depressive disorder), recurrent, severe, with psychosis (HCC) Sees therapist at [facility] in [place], comes to her house or on the phone Also has psychiatrist in [place], has not made changes of late Many, many stressors of late: she may lose housing soon, may need to work despite being postpartum, may lose SNAP benefits Endorses increased symptoms of depression and intermittent passive SI without a plan Contracts for safety, given after hours number Encouraged her to reach out to [place] or her psych team if she is having more than passive SI Does seem to have good support system in her boyfriend and mother Will need close follow up PP  Preterm labor symptoms and general obstetric precautions including but not limited to vaginal bleeding, contractions, leaking of fluid and fetal movement were reviewed in detail with the patient. Please refer to After Visit Summary for other counseling recommendations.  Return in 2 weeks (on 06/21/2022) for Dyad patient, ob visit.   Venora Maples, MD

## 2022-06-07 NOTE — Patient Instructions (Addendum)
  We are very excited to take care of your family. If you have any concerns after business hours or on the weekends please call  [phone]  This will connect you to our after hours nurse-line and they will be able to contact the doctor on call for any questions. One of our family physicians is on-call at all times to assure your children receive the best care and to help you with any urgent medical needs.     Contraception Choices Contraception, also called birth control, refers to methods or devices that prevent pregnancy. Hormonal methods  Contraceptive implant A contraceptive implant is a thin, plastic tube that contains a hormone that prevents pregnancy. It is different from an intrauterine device (IUD). It is inserted into the upper part of the arm by a health care provider. Implants can be effective for up to 3 years. Progestin-only injections Progestin-only injections are injections of progestin, a synthetic form of the hormone progesterone. They are given every 3 months by a health care provider. Birth control pills Birth control pills are pills that contain hormones that prevent pregnancy. They must be taken once a day, preferably at the same time each day. A prescription is needed to use this method of contraception. Birth control patch The birth control patch contains hormones that prevent pregnancy. It is placed on the skin and must be changed once a week for three weeks and removed on the fourth week. A prescription is needed to use this method of contraception. Vaginal ring A vaginal ring contains hormones that prevent pregnancy. It is placed in the vagina for three weeks and removed on the fourth week. After that, the process is repeated with a new ring. A prescription is needed to use this method of contraception. Emergency contraceptive Emergency contraceptives prevent pregnancy after unprotected sex. They come in pill form and can be taken up to 5 days after sex. They  work best the sooner they are taken after having sex. Most emergency contraceptives are available without a prescription. This method should not be used as your only form of birth control. Barrier methods  Female condom A female condom is a thin sheath that is worn over the penis during sex. Condoms keep sperm from going inside a woman's body. They can be used with a sperm-killing substance (spermicide) to increase their effectiveness. They should be thrown away after one use. Female condom A female condom is a soft, loose-fitting sheath that is put into the vagina before sex. The condom keeps sperm from going inside a woman's body. They should be thrown away after one use. Diaphragm A diaphragm is a soft, dome-shaped barrier. It is inserted into the vagina before sex, along with a spermicide. The diaphragm blocks sperm from entering the uterus, and the spermicide kills sperm. A diaphragm should be left in the vagina for 6-8 hours after sex and removed within 24 hours. A diaphragm is prescribed and fitted by a health care provider. A diaphragm should be replaced every 1-2 years, after giving birth, after gaining more than 15 lb (6.8 kg), and after pelvic surgery. Cervical cap A cervical cap is a round, soft latex or plastic cup that fits over the cervix. It is inserted into the vagina before sex, along with spermicide. It blocks sperm from entering the uterus. The cap should be left in place for 6-8 hours after sex and removed within 48 hours. A cervical cap must be prescribed and fitted by a health care provider. It should be   replaced every 2 years. Sponge A sponge is a soft, circular piece of polyurethane foam with spermicide in it. The sponge helps block sperm from entering the uterus, and the spermicide kills sperm. To use it, you make it wet and then insert it into the vagina. It should be inserted before sex, left in for at least 6 hours after sex, and removed and thrown away within 30  hours. Spermicides Spermicides are chemicals that kill or block sperm from entering the cervix and uterus. They can come as a cream, jelly, suppository, foam, or tablet. A spermicide should be inserted into the vagina with an applicator at least 10-15 minutes before sex to allow time for it to work. The process must be repeated every time you have sex. Spermicides do not require a prescription. Intrauterine contraception Intrauterine device (IUD) An IUD is a T-shaped device that is put in a woman's uterus. There are two types: Hormone IUD.This type contains progestin, a synthetic form of the hormone progesterone. This type can stay in place for 3-5 years. Copper IUD.This type is wrapped in copper wire. It can stay in place for 10 years. Permanent methods of contraception Female tubal ligation In this method, a woman's fallopian tubes are sealed, tied, or blocked during surgery to prevent eggs from traveling to the uterus. Hysteroscopic sterilization In this method, a small, flexible insert is placed into each fallopian tube. The inserts cause scar tissue to form in the fallopian tubes and block them, so sperm cannot reach an egg. The procedure takes about 3 months to be effective. Another form of birth control must be used during those 3 months. Female sterilization This is a procedure to tie off the tubes that carry sperm (vasectomy). After the procedure, the man can still ejaculate fluid (semen). Another form of birth control must be used for 3 months after the procedure. Natural planning methods Natural family planning In this method, a couple does not have sex on days when the woman could become pregnant. Calendar method In this method, the woman keeps track of the length of each menstrual cycle, identifies the days when pregnancy can happen, and does not have sex on those days. Ovulation method In this method, a couple avoids sex during ovulation. Symptothermal method This method involves  not having sex during ovulation. The woman typically checks for ovulation by watching changes in her temperature and in the consistency of cervical mucus. Post-ovulation method In this method, a couple waits to have sex until after ovulation. Where to find more information Centers for Disease Control and Prevention: www.cdc.gov Summary Contraception, also called birth control, refers to methods or devices that prevent pregnancy. Hormonal methods of contraception include implants, injections, pills, patches, vaginal rings, and emergency contraceptives. Barrier methods of contraception can include female condoms, female condoms, diaphragms, cervical caps, sponges, and spermicides. There are two types of IUDs (intrauterine devices). An IUD can be put in a woman's uterus to prevent pregnancy for 3-5 years. Permanent sterilization can be done through a procedure for males and females. Natural family planning methods involve nothaving sex on days when the woman could become pregnant. This information is not intended to replace advice given to you by your health care provider. Make sure you discuss any questions you have with your health care provider. Document Revised: 03/06/2020 Document Reviewed: 03/06/2020 Elsevier Patient Education  2023 [employer]  

## 2022-06-08 ENCOUNTER — Inpatient Hospital Stay (HOSPITAL_COMMUNITY)
Admission: AD | Admit: 2022-06-08 | Discharge: 2022-06-08 | Disposition: A | Discharge status: Home / Self Care | Payer: Medicaid Other | Attending: Family Medicine | Admitting: Family Medicine

## 2022-06-08 ENCOUNTER — Encounter (HOSPITAL_COMMUNITY): Payer: Self-pay | Admitting: Family Medicine

## 2022-06-08 DIAGNOSIS — R609 Edema, unspecified: Secondary | ICD-10-CM | POA: Insufficient documentation

## 2022-06-08 DIAGNOSIS — O26893 Other specified pregnancy related conditions, third trimester: Secondary | ICD-10-CM | POA: Diagnosis not present

## 2022-06-08 DIAGNOSIS — Z3A35 35 weeks gestation of pregnancy: Secondary | ICD-10-CM | POA: Insufficient documentation

## 2022-06-08 DIAGNOSIS — Z3689 Encounter for other specified antenatal screening: Secondary | ICD-10-CM | POA: Diagnosis not present

## 2022-06-08 DIAGNOSIS — O1203 Gestational edema, third trimester: Secondary | ICD-10-CM

## 2022-06-08 DIAGNOSIS — O10913 Unspecified pre-existing hypertension complicating pregnancy, third trimester: Secondary | ICD-10-CM | POA: Diagnosis not present

## 2022-06-08 LAB — URINALYSIS, ROUTINE W REFLEX MICROSCOPIC
Bilirubin Urine: NEGATIVE
Glucose, UA: NEGATIVE mg/dL
Hgb urine dipstick: NEGATIVE
Ketones, ur: NEGATIVE mg/dL
Nitrite: NEGATIVE
Protein, ur: NEGATIVE mg/dL
Specific Gravity, Urine: 1.003 — ABNORMAL LOW (ref 1.005–1.030)
pH: 7 (ref 5.0–8.0)

## 2022-06-08 NOTE — MAU Note (Signed)
Janet Horn is a 21 y.o. at [redacted]w[redacted]d here in MAU reporting: pt arrived via EMS due to swelling in the legs. Pt states she noticed swelling in her legs 2 days ago. Pt states the right side is worse than the left. Pt states that she saw Dr. Crissie Reese yesterday but forgot to mention the swelling. When she got home from the appointment the swelling was so bad she couldn't stand so she called EMS. Pt states she took her own BP and it was high at 135/85. Pt states she is still feeling baby move but less than normal. Pt denies VB or LOF.   Onset of complaint: 06/06/2022 Pain score: denies pain  Vitals:   06/08/22 0038  BP: 110/68  Pulse: 83  Resp: 20  Temp: 98.7 F (37.1 C)  SpO2: 98%     FHT: 125 Lab orders placed from triage: Urinalysis
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
## 2022-06-13 ENCOUNTER — Encounter (HOSPITAL_COMMUNITY): Payer: Self-pay | Admitting: Obstetrics & Gynecology

## 2022-06-13 ENCOUNTER — Inpatient Hospital Stay (HOSPITAL_BASED_OUTPATIENT_CLINIC_OR_DEPARTMENT_OTHER): Payer: Medicaid Other

## 2022-06-13 ENCOUNTER — Inpatient Hospital Stay (HOSPITAL_COMMUNITY)
Admission: AD | Admit: 2022-06-13 | Discharge: 2022-06-16 | DRG: 831 | Disposition: A | Discharge status: Home / Self Care | Payer: Medicaid Other | Attending: Obstetrics & Gynecology | Admitting: Obstetrics & Gynecology

## 2022-06-13 ENCOUNTER — Encounter: Payer: Self-pay | Admitting: Family Medicine

## 2022-06-13 ENCOUNTER — Other Ambulatory Visit: Payer: Self-pay

## 2022-06-13 DIAGNOSIS — F4310 Post-traumatic stress disorder, unspecified: Secondary | ICD-10-CM | POA: Diagnosis present

## 2022-06-13 DIAGNOSIS — F319 Bipolar disorder, unspecified: Secondary | ICD-10-CM | POA: Diagnosis present

## 2022-06-13 DIAGNOSIS — F4323 Adjustment disorder with mixed anxiety and depressed mood: Secondary | ICD-10-CM

## 2022-06-13 DIAGNOSIS — O24419 Gestational diabetes mellitus in pregnancy, unspecified control: Secondary | ICD-10-CM | POA: Diagnosis present

## 2022-06-13 DIAGNOSIS — A749 Chlamydial infection, unspecified: Secondary | ICD-10-CM

## 2022-06-13 DIAGNOSIS — D563 Thalassemia minor: Secondary | ICD-10-CM

## 2022-06-13 DIAGNOSIS — O4693 Antepartum hemorrhage, unspecified, third trimester: Principal | ICD-10-CM | POA: Diagnosis present

## 2022-06-13 DIAGNOSIS — Z3A35 35 weeks gestation of pregnancy: Secondary | ICD-10-CM | POA: Diagnosis not present

## 2022-06-13 DIAGNOSIS — F1991 Other psychoactive substance use, unspecified, in remission: Secondary | ICD-10-CM

## 2022-06-13 DIAGNOSIS — O98313 Other infections with a predominantly sexual mode of transmission complicating pregnancy, third trimester: Secondary | ICD-10-CM | POA: Diagnosis present

## 2022-06-13 DIAGNOSIS — O99343 Other mental disorders complicating pregnancy, third trimester: Secondary | ICD-10-CM

## 2022-06-13 DIAGNOSIS — A6000 Herpesviral infection of urogenital system, unspecified: Secondary | ICD-10-CM | POA: Diagnosis present

## 2022-06-13 DIAGNOSIS — B009 Herpesviral infection, unspecified: Secondary | ICD-10-CM | POA: Diagnosis present

## 2022-06-13 DIAGNOSIS — Z3A36 36 weeks gestation of pregnancy: Secondary | ICD-10-CM | POA: Diagnosis not present

## 2022-06-13 DIAGNOSIS — F603 Borderline personality disorder: Secondary | ICD-10-CM | POA: Diagnosis present

## 2022-06-13 DIAGNOSIS — F3130 Bipolar disorder, current episode depressed, mild or moderate severity, unspecified: Secondary | ICD-10-CM

## 2022-06-13 DIAGNOSIS — O0990 Supervision of high risk pregnancy, unspecified, unspecified trimester: Secondary | ICD-10-CM

## 2022-06-13 DIAGNOSIS — O285 Abnormal chromosomal and genetic finding on antenatal screening of mother: Secondary | ICD-10-CM | POA: Diagnosis not present

## 2022-06-13 DIAGNOSIS — Z8632 Personal history of gestational diabetes: Secondary | ICD-10-CM | POA: Diagnosis present

## 2022-06-13 DIAGNOSIS — R011 Cardiac murmur, unspecified: Secondary | ICD-10-CM

## 2022-06-13 DIAGNOSIS — O24410 Gestational diabetes mellitus in pregnancy, diet controlled: Secondary | ICD-10-CM | POA: Diagnosis not present

## 2022-06-13 DIAGNOSIS — F333 Major depressive disorder, recurrent, severe with psychotic symptoms: Secondary | ICD-10-CM

## 2022-06-13 DIAGNOSIS — O99413 Diseases of the circulatory system complicating pregnancy, third trimester: Secondary | ICD-10-CM | POA: Diagnosis not present

## 2022-06-13 DIAGNOSIS — O98513 Other viral diseases complicating pregnancy, third trimester: Secondary | ICD-10-CM | POA: Diagnosis not present

## 2022-06-13 DIAGNOSIS — O98813 Other maternal infectious and parasitic diseases complicating pregnancy, third trimester: Secondary | ICD-10-CM

## 2022-06-13 LAB — CBC
HCT: 33.1 % — ABNORMAL LOW (ref 36.0–46.0)
Hemoglobin: 10.6 g/dL — ABNORMAL LOW (ref 12.0–15.0)
MCH: 23.8 pg — ABNORMAL LOW (ref 26.0–34.0)
MCHC: 32 g/dL (ref 30.0–36.0)
MCV: 74.4 fL — ABNORMAL LOW (ref 80.0–100.0)
Platelets: 303 10*3/uL (ref 150–400)
RBC: 4.45 MIL/uL (ref 3.87–5.11)
RDW: 13.7 % (ref 11.5–15.5)
WBC: 8.7 10*3/uL (ref 4.0–10.5)
nRBC: 0 % (ref 0.0–0.2)

## 2022-06-13 LAB — WET PREP, GENITAL
Clue Cells Wet Prep HPF POC: NONE SEEN
Sperm: NONE SEEN
Trich, Wet Prep: NONE SEEN
WBC, Wet Prep HPF POC: >=10 — AB (ref <10)
Yeast Wet Prep HPF POC: NONE SEEN

## 2022-06-13 LAB — TYPE AND SCREEN
ABO/RH(D): AB POS
Antibody Screen: NEGATIVE

## 2022-06-13 LAB — GLUCOSE, CAPILLARY: Glucose-Capillary: 79 mg/dL (ref 70–99)

## 2022-06-13 MED ORDER — CALCIUM CARBONATE ANTACID 500 MG PO CHEW: 2.0000 | CHEWABLE_TABLET | ORAL | Status: DC | PRN

## 2022-06-13 MED ORDER — SERTRALINE HCL 25 MG PO TABS
25.0000 mg | ORAL_TABLET | Freq: Every day | ORAL | Status: DC
  Administered 2022-06-13: 25 mg via ORAL
  Filled 2022-06-13 (×2): qty 1

## 2022-06-13 MED ORDER — ONDANSETRON HCL 4 MG/2ML IJ SOLN
4.0000 mg | Freq: Four times a day (QID) | INTRAMUSCULAR | Status: DC | PRN
  Filled 2022-06-13: qty 2

## 2022-06-13 MED ORDER — OXYCODONE-ACETAMINOPHEN 5-325 MG PO TABS
2.0000 | ORAL_TABLET | ORAL | Status: DC | PRN
  Filled 2022-06-13: qty 2

## 2022-06-13 MED ORDER — ZOLPIDEM TARTRATE 5 MG PO TABS: 5.0000 mg | ORAL_TABLET | Freq: Every evening | ORAL | Status: DC | PRN

## 2022-06-13 MED ORDER — OXYTOCIN-SODIUM CHLORIDE 30-0.9 UT/500ML-% IV SOLN: 2.5000 [IU]/h | INTRAVENOUS | Status: DC

## 2022-06-13 MED ORDER — OXYCODONE-ACETAMINOPHEN 5-325 MG PO TABS: 1.0000 | ORAL_TABLET | ORAL | Status: DC | PRN

## 2022-06-13 MED ORDER — DOCUSATE SODIUM 100 MG PO CAPS
100.0000 mg | ORAL_CAPSULE | Freq: Every day | ORAL | Status: DC
  Administered 2022-06-13: 100 mg via ORAL
  Filled 2022-06-13: qty 1

## 2022-06-13 MED ORDER — SODIUM CHLORIDE 0.9 % IV SOLN
5.0000 10*6.[IU] | Freq: Once | INTRAVENOUS | Status: AC
  Administered 2022-06-13: 5 10*6.[IU] via INTRAVENOUS
  Filled 2022-06-13: qty 5

## 2022-06-13 MED ORDER — VALACYCLOVIR HCL 500 MG PO TABS
500.0000 mg | ORAL_TABLET | Freq: Two times a day (BID) | ORAL | Status: DC
  Administered 2022-06-13: 500 mg via ORAL
  Filled 2022-06-13: qty 1

## 2022-06-13 MED ORDER — OXYCODONE HCL 5 MG PO TABS
10.0000 mg | ORAL_TABLET | ORAL | Status: DC | PRN
Start: 2022-06-13 — End: 2022-06-13

## 2022-06-13 MED ORDER — SOD CITRATE-CITRIC ACID 500-334 MG/5ML PO SOLN: 30.0000 mL | ORAL | Status: DC | PRN

## 2022-06-13 MED ORDER — ACETAMINOPHEN 325 MG PO TABS: 650.0000 mg | ORAL_TABLET | ORAL | Status: DC | PRN

## 2022-06-13 MED ORDER — CYCLOBENZAPRINE HCL 10 MG PO TABS
10.0000 mg | ORAL_TABLET | Freq: Three times a day (TID) | ORAL | Status: DC | PRN
  Administered 2022-06-14: 10 mg via ORAL
  Filled 2022-06-13: qty 1

## 2022-06-13 MED ORDER — PRENATAL MULTIVITAMIN CH
1.0000 | ORAL_TABLET | Freq: Every day | ORAL | Status: DC
  Filled 2022-06-13: qty 1

## 2022-06-13 MED ORDER — LACTATED RINGERS IV BOLUS
1000.0000 mL | Freq: Once | INTRAVENOUS | Status: AC
  Administered 2022-06-13: 1000 mL via INTRAVENOUS

## 2022-06-13 MED ORDER — OXYCODONE HCL 5 MG PO TABS: 10.0000 mg | ORAL_TABLET | ORAL | Status: DC | PRN

## 2022-06-13 MED ORDER — OXYCODONE HCL 5 MG PO TABS
5.0000 mg | ORAL_TABLET | ORAL | Status: DC | PRN
Start: 2022-06-13 — End: 2022-06-13

## 2022-06-13 MED ORDER — LACTATED RINGERS IV SOLN: INTRAVENOUS | Status: DC

## 2022-06-13 MED ORDER — LIDOCAINE HCL (PF) 1 % IJ SOLN: 30.0000 mL | INTRAMUSCULAR | Status: DC | PRN

## 2022-06-13 MED ORDER — ACETAMINOPHEN 325 MG PO TABS
650.0000 mg | ORAL_TABLET | ORAL | Status: DC | PRN
  Administered 2022-06-13: 650 mg via ORAL
  Filled 2022-06-13: qty 2

## 2022-06-13 MED ORDER — OXYCODONE HCL 5 MG PO TABS: 5.0000 mg | ORAL_TABLET | ORAL | Status: DC | PRN

## 2022-06-13 MED ORDER — OXYTOCIN BOLUS FROM INFUSION: 333.0000 mL | Freq: Once | INTRAVENOUS | Status: DC

## 2022-06-13 MED ORDER — FENTANYL CITRATE (PF) 100 MCG/2ML IJ SOLN
100.0000 ug | INTRAMUSCULAR | Status: DC | PRN
  Administered 2022-06-13: 100 ug via INTRAVENOUS
  Filled 2022-06-13: qty 2

## 2022-06-13 MED ORDER — NIFEDIPINE ER OSMOTIC RELEASE 30 MG PO TB24
30.0000 mg | ORAL_TABLET | Freq: Two times a day (BID) | ORAL | Status: DC
  Administered 2022-06-13 – 2022-06-16 (×6): 30 mg via ORAL
  Filled 2022-06-13 (×6): qty 1

## 2022-06-13 MED ORDER — HYDROXYZINE PAMOATE 25 MG PO CAPS: 25.0000 mg | ORAL_CAPSULE | Freq: Three times a day (TID) | ORAL | Status: DC | PRN

## 2022-06-13 MED ORDER — LACTATED RINGERS IV SOLN: 500.0000 mL | INTRAVENOUS | Status: DC | PRN

## 2022-06-13 MED ORDER — VALACYCLOVIR HCL 500 MG PO TABS
500.0000 mg | ORAL_TABLET | Freq: Two times a day (BID) | ORAL | Status: DC
  Administered 2022-06-14 – 2022-06-16 (×5): 500 mg via ORAL
  Filled 2022-06-13 (×6): qty 1

## 2022-06-13 MED ORDER — PENICILLIN G POT IN DEXTROSE 60000 UNIT/ML IV SOLN
3.0000 10*6.[IU] | INTRAVENOUS | Status: DC
  Administered 2022-06-14: 3 10*6.[IU] via INTRAVENOUS
  Filled 2022-06-13: qty 50

## 2022-06-13 NOTE — MAU Note (Signed)
.  Janet Horn is a 21 y.o. at [redacted]w[redacted]d here in MAU reporting: BR vaginal bleeding since 06/13/22 at 0630 LMP: 10/02/21 Onset of complaint: 06/13/22 0630 Pain score: 8/10 Vitals:   06/13/22 0927 06/13/22 0928  BP:  129/72  Pulse:  (!) 106  Resp:  14  Temp:  98.3 F (36.8 C)  SpO2: 98%      FHT:150s
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
## 2022-06-13 NOTE — Progress Notes (Signed)
    Faculty Practice OB/GYN Attending Note  Subjective:  Called to evaluate patient with increased anxiety, pelvic pressure, low back pain and wants cervical check. Reports painful contractions. No LOF or vaginal bleeding. Good FM.   Admitted on 06/13/2022 for Vaginal bleeding in pregnancy, third trimester.    Objective:  Blood pressure 122/75, pulse 73, temperature 98.5 F (36.9 C), temperature source Oral, resp. rate 16, height 5\' 2"  (1.575 m), weight 82.1 kg, last menstrual period 10/02/2021, SpO2 100 %. FHT  Baseline 130 bpm, moderate variability, +accelerations, no decelerations Toco: 1-6 mins Gen: NAD Abdomen: NT gravid fundus, soft Cervix: Dilation: 2 Effacement (%): 60 Cervical Position: Middle Station: -3 Presentation: Vertex Exam by:: Dr. [email or web address] Ext: 2+ DTRs, no edema, no cyanosis, negative Homan's sign  Assessment & Plan:  21 y.o. G2P0010 at [redacted]w[redacted]d admitted for vaginal bleeding, concern for PTL, with occasional painful contractions.  Reassured by no cervical change for several hours now.  Category 1 FHR tracing. She declines Fentanyl for pain, Oxycodone ordered as needed for  pain. Will also do trial of Procardia XL for the symptomatic contractions, Flexeril ordered for back pain. Will continue close observation here on L&D for now.   [redacted]w[redacted]d, MD, FACOG Obstetrician & Gynecologist, [facility]

## 2022-06-13 NOTE — Progress Notes (Signed)
Patient Vitals for the past 4 hrs:  BP Temp Temp src Pulse Resp SpO2  06/13/22 2055 122/75 -- -- 73 -- --  06/13/22 1906 117/62 -- -- 62 16 --  06/13/22 1751 123/70 98.5 F (36.9 C) Oral 69 16 --  06/13/22 1735 129/73 98.1 F (36.7 C) -- 67 (!) 21 100 %   Ctx are spaced out to q 2-8 minutes, but painful when they occur. No more bleeding. Pt states last HSV outbreak 05/30/22.  Dr. Macon Large in room and discussed risk of transfer to the baby is higher if she has a vaginal birth. Because ctx have slowed down significantly, will defer VE for now.  Pt has hx of fentanyl addiction/overdose, so would like to avoid repetitive dosing.  Will give procardia XL to hopefully keep uterus quiet.

## 2022-06-13 NOTE — Progress Notes (Signed)
[name] Jeff Mccallum is a 21 y.o. G2P0010 at [redacted]w[redacted]d by ultrasound admitted for Preterm labor  Subjective:   Objective: BP 117/72 (BP Location: Left Arm)   Pulse 73   Temp 98.4 F (36.9 C) (Oral)   Resp 18   Ht 5\' 2"  (1.575 m)   Wt 82.1 kg   LMP 10/02/2021 (Approximate)   SpO2 99%   BMI 33.11 kg/m  No intake/output data recorded. No intake/output data recorded.  FHT:   UC:   irregular, every 4-5 minutes SVE:   Dilation: 2 Effacement (%): 70 Station: 0, -1 Exam by:: Dr. [email or web address]  Labs: Lab Results  Component Value Date   WBC 8.7 06/13/2022   HGB 10.6 (L) 06/13/2022   HCT 33.1 (L) 06/13/2022   MCV 74.4 (L) 06/13/2022   PLT 303 06/13/2022    Assessment / Plan: Preterm labor   Labor:  Preeclampsia:  no signs or symptoms of toxicity Fetal Wellbeing:  Category I Pain Control:  Labor support without medications I/D:   PCN for GBS prophylaxis Anticipated MOD:  NSVD  06/15/2022, MD 06/13/2022, 4:13 PM

## 2022-06-14 DIAGNOSIS — O4693 Antepartum hemorrhage, unspecified, third trimester: Secondary | ICD-10-CM | POA: Diagnosis not present

## 2022-06-14 LAB — GLUCOSE, CAPILLARY
Glucose-Capillary: 95 mg/dL (ref 70–99)
Glucose-Capillary: 96 mg/dL (ref 70–99)
Glucose-Capillary: 95 mg/dL (ref 70–99)
Glucose-Capillary: 105 mg/dL — ABNORMAL HIGH (ref 70–99)
Glucose-Capillary: 114 mg/dL — ABNORMAL HIGH (ref 70–99)
Glucose-Capillary: 118 mg/dL — ABNORMAL HIGH (ref 70–99)

## 2022-06-14 MED ORDER — CALCIUM CARBONATE ANTACID 500 MG PO CHEW: 2.0000 | CHEWABLE_TABLET | ORAL | Status: DC | PRN

## 2022-06-14 MED ORDER — SERTRALINE HCL 50 MG PO TABS
25.0000 mg | ORAL_TABLET | Freq: Every day | ORAL | Status: DC
  Administered 2022-06-14 – 2022-06-15 (×2): 25 mg via ORAL
  Filled 2022-06-14 (×5): qty 1

## 2022-06-14 MED ORDER — ACETAMINOPHEN 325 MG PO TABS: 650.0000 mg | ORAL_TABLET | ORAL | Status: DC | PRN

## 2022-06-14 MED ORDER — COMPLETENATE 29-1 MG PO CHEW
1.0000 | CHEWABLE_TABLET | Freq: Every day | ORAL | Status: DC
  Filled 2022-06-14 (×2): qty 1

## 2022-06-14 MED ORDER — ZOLPIDEM TARTRATE 5 MG PO TABS: 5.0000 mg | ORAL_TABLET | Freq: Every evening | ORAL | Status: DC | PRN

## 2022-06-14 MED ORDER — DOCUSATE SODIUM 100 MG PO CAPS
100.0000 mg | ORAL_CAPSULE | Freq: Every day | ORAL | Status: DC
  Administered 2022-06-14 – 2022-06-16 (×2): 100 mg via ORAL
  Filled 2022-06-14 (×4): qty 1

## 2022-06-14 MED ORDER — PRENATAL MULTIVITAMIN CH
1.0000 | ORAL_TABLET | Freq: Every day | ORAL | Status: DC
  Filled 2022-06-14: qty 1

## 2022-06-14 NOTE — Progress Notes (Signed)
    Faculty [facility] OB/GYN Attending Note  Subjective:  Patient reports much improved, less frequent painful contractions. No LOF or vaginal bleeding. Good FM.   Admitted on 06/13/2022 for Vaginal bleeding in pregnancy, third trimester.    Objective:  Blood pressure 110/75, pulse 76, temperature 98.6 F (37 C), temperature source Oral, resp. rate 16, height 5\' 2"  (1.575 m), weight 82.1 kg, last menstrual period 10/02/2021, SpO2 100 %. FHT  Baseline 130 bpm, moderate variability, +accelerations, no decelerations Toco: 5-10 mins Gen: NAD Abdomen: NT gravid fundus, soft Cervix: Deferred Ext: 2+ DTRs, no edema, no cyanosis, negative Homan's sign  Assessment & Plan:  21 y.o. G2P0010 at [redacted]w[redacted]d admitted for vaginal bleeding, concern for PTL, now with more spaced out contractions.  Category 1 FHR tracing. She has not required any analgesia for several hours.  Currently on Procardia XL 30 mg bid for the symptomatic contractions and will continue this for now. Given stable status, patient can be transferred to [facility] for further observation.  Patient is aware that cesarean delivery is indicated if she progresses in PTL given her recent gential HSV episode.  Will continue close inpatient observation for now.   [facility], MD, FACOG Obstetrician & Gynecologist, [facility] for [facility]

## 2022-06-15 ENCOUNTER — Encounter: Payer: Self-pay | Admitting: Family Medicine

## 2022-06-15 DIAGNOSIS — O4693 Antepartum hemorrhage, unspecified, third trimester: Secondary | ICD-10-CM | POA: Diagnosis not present

## 2022-06-15 DIAGNOSIS — Z3A36 36 weeks gestation of pregnancy: Secondary | ICD-10-CM | POA: Diagnosis not present

## 2022-06-15 LAB — CULTURE, BETA STREP (GROUP B ONLY)

## 2022-06-15 LAB — GLUCOSE, CAPILLARY
Glucose-Capillary: 93 mg/dL (ref 70–99)
Glucose-Capillary: 124 mg/dL — ABNORMAL HIGH (ref 70–99)
Glucose-Capillary: 139 mg/dL — ABNORMAL HIGH (ref 70–99)
Glucose-Capillary: 117 mg/dL — ABNORMAL HIGH (ref 70–99)

## 2022-06-15 LAB — OB RESULTS CONSOLE GBS: GBS: NEGATIVE

## 2022-06-15 NOTE — Progress Notes (Signed)
Patient ID: Janet Horn, female   DOB: May 27, 2001, 21 y.o.   MRN: [id number] FACULTY PRACTICE ANTEPARTUM(COMPREHENSIVE) NOTE  Janet Horn is a 21 y.o. G2P0010 at [redacted]w[redacted]d by {Ob dating:14516} who is admitted for {Reason for Admission:21615}.   Fetal presentation is {desc; fetal presentation:14558}. Length of Stay:  2  Days  ASSESSMENT: Principal Problem:   Vaginal bleeding in pregnancy, third trimester Active Problems:   Preterm labor in third trimester   PLAN: ***  Subjective: *** Patient reports the fetal movement as {Fetal Movement:20184}. Patient reports uterine contraction  activity as {obgyn contractions reg/irreg:312982}. Patient reports  vaginal bleeding as {desc; vaginal bleeding:14141:s}. Patient describes fluid per vagina as {desc; amniotic fluid:31309}.  Vitals:  Blood pressure 113/63, pulse 74, temperature 98.1 F (36.7 C), temperature source Oral, resp. rate 18, height 5\' 2"  (1.575 m), weight 82.1 kg, last menstrual period 10/02/2021, SpO2 100 %. Physical Examination:  General appearance - alert, well appearing, and in no distress Chest - normal effort Abdomen - gravid, non-tender Fundal Height:  {findings; fundal height:14540} Extremities: {Exam; extremity:5109}  Membranes:{:314523}  Fetal Monitoring:  {findings; monitor fetal heart monitor:31527}  Labs:  Results for orders placed or performed during the hospital encounter of 06/13/22 (from the past 24 hour(s))  Glucose, capillary   Collection Time: 06/14/22  4:38 PM  Result Value Ref Range   Glucose-Capillary 114 (H) 70 - 99 mg/dL  Glucose, capillary   Collection Time: 06/14/22  8:57 PM  Result Value Ref Range   Glucose-Capillary 118 (H) 70 - 99 mg/dL  Glucose, capillary   Collection Time: 06/15/22  8:47 AM  Result Value Ref Range   Glucose-Capillary 93 70 - 99 mg/dL  Glucose, capillary   Collection Time: 06/15/22 11:26 AM  Result Value Ref Range   Glucose-Capillary 124 (H) 70 - 99  mg/dL    Imaging Studies:    *** Currently EPIC will not allow sonographic studies to automatically populate into notes.  In the meantime, copy and paste results into note or free text.  Medications:  Scheduled  docusate sodium  100 mg Oral Daily   NIFEdipine  30 mg Oral BID   prenatal vitamin w/FE, FA  1 tablet Oral QHS   sertraline  25 mg Oral Daily   valACYclovir  500 mg Oral BID   {medication reviewed/display:3041432}   08/15/22, MD 06/15/2022,1:50 PM

## 2022-06-16 DIAGNOSIS — O24410 Gestational diabetes mellitus in pregnancy, diet controlled: Secondary | ICD-10-CM

## 2022-06-16 DIAGNOSIS — Z3A36 36 weeks gestation of pregnancy: Secondary | ICD-10-CM | POA: Diagnosis not present

## 2022-06-16 DIAGNOSIS — O98513 Other viral diseases complicating pregnancy, third trimester: Secondary | ICD-10-CM

## 2022-06-16 DIAGNOSIS — O4693 Antepartum hemorrhage, unspecified, third trimester: Secondary | ICD-10-CM | POA: Diagnosis not present

## 2022-06-16 DIAGNOSIS — B009 Herpesviral infection, unspecified: Secondary | ICD-10-CM

## 2022-06-16 LAB — GLUCOSE, CAPILLARY
Glucose-Capillary: 99 mg/dL (ref 70–99)
Glucose-Capillary: 153 mg/dL — ABNORMAL HIGH (ref 70–99)

## 2022-06-16 MED ORDER — NIFEDIPINE ER 30 MG PO TB24
30.0000 mg | ORAL_TABLET | Freq: Two times a day (BID) | ORAL | 2 refills | Status: DC
  Filled 2022-06-16: qty 60, 30d supply, fill #0

## 2022-06-16 NOTE — Progress Notes (Signed)
FACULTY PRACTICE ANTEPARTUM(COMPREHENSIVE) NOTE  Janet Horn is a 21 y.o. G2P0010 at [redacted]w[redacted]d by best clinical estimate who is admitted for Preterm labor.   Fetal presentation is cephalic. Length of Stay:  3  Days  ASSESSMENT: Principal Problem:   Vaginal bleeding in pregnancy, third trimester Active Problems:   Preterm labor in third trimester   PLAN: Continue inpt monitoring Continue Procardia Advance activity  Subjective: Having a few contractions Patient reports the fetal movement as active. Patient reports uterine contraction  activity as none. Patient reports  vaginal bleeding as none. Patient describes fluid per vagina as None.  Vitals:  Blood pressure 118/61, pulse 66, temperature 98.2 F (36.8 C), temperature source Oral, resp. rate 17, height 5\' 2"  (1.575 m), weight 82.1 kg, last menstrual period 10/02/2021, SpO2 97 %. Physical Examination:  General appearance - alert, well appearing, and in no distress Chest - normal effort Abdomen - gravid, non-tender Fundal Height:  size equals dates Extremities: extremities normal, atraumatic, no cyanosis or edema  Membranes:intact  Fetal Monitoring:  Baseline: 145 bpm, Variability: Good {> 6 bpm), Accelerations: Reactive, and Decelerations: Absent  Labs:  Results for orders placed or performed during the hospital encounter of 06/13/22 (from the past 24 hour(s))  Glucose, capillary   Collection Time: 06/15/22  8:47 AM  Result Value Ref Range   Glucose-Capillary 93 70 - 99 mg/dL  Glucose, capillary   Collection Time: 06/15/22 11:26 AM  Result Value Ref Range   Glucose-Capillary 124 (H) 70 - 99 mg/dL  Glucose, capillary   Collection Time: 06/15/22  2:58 PM  Result Value Ref Range   Glucose-Capillary 139 (H) 70 - 99 mg/dL  Glucose, capillary   Collection Time: 06/15/22  8:52 PM  Result Value Ref Range   Glucose-Capillary 117 (H) 70 - 99 mg/dL  Glucose, capillary   Collection Time: 06/16/22  5:34 AM  Result  Value Ref Range   Glucose-Capillary 99 70 - 99 mg/dL      Medications:  Scheduled  docusate sodium  100 mg Oral Daily   NIFEdipine  30 mg Oral BID   prenatal vitamin w/FE, FA  1 tablet Oral QHS   sertraline  25 mg Oral Daily   valACYclovir  500 mg Oral BID   I have reviewed the patient's current medications.   08/16/22, MD 06/16/2022,7:42 AM

## 2022-06-16 NOTE — Discharge Summary (Signed)
Antenatal Physician Discharge Summary  Patient ID: Mylee Falin MRN: [id number] DOB/AGE: 03-Mar-2001 21 y.o.  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 G2P0010 with IUP at 31w5dadmitted for concern about preterm labor.  She was admitted with contractions and some vaginal bleeding that was attributed to cervical change, noted to have a cervical exam of 1.5/70/0/vertex.  No leaking of fluid and no bleeding. No fevers,  WBC 8.7, no signs of infection.  She continued to have painful contractions and was observed on Labor and Delivery, her exam was reported to change to 2 cm dilation, but no further effacement. Patient has history of OUD and declined analgesia, but got relief from her painful contractions with Procardia XL 30 mg po bid.  Further observation was done, the fetal heart rate monitoring remained reassuring, and she had no signs/symptoms of progressing preterm labor or other maternal-fetal concerns.  Her cervical exam was basically unchanged from admission.  She was deemed stable for discharge to home with outpatient follow up, and was prescribed Procardia XL for management of symptomatic contractions.  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 She was consented for cesarean delivery in the event of PTL progression in order to reduce risk of neonatal transmission. This will be re-evaluated  when she re-presents in labor, hopefully much later in pregnancy.  Discharge Exam: Temp:  [97.5 F (36.4 C)-98.4 F (36.9 C)] 98.4 F (36.9 C) (09/03 1111) Pulse Rate:  [66-74] 66 (09/03 0520) Resp:  [16-17] 16 (09/03 1111) BP: (112-122)/(61-74) 118/61 (09/03 0520) SpO2:  [97 %-100 %] 97 % (09/03 1111) Physical Examination: CONSTITUTIONAL: Well-developed, well-nourished female in no acute distress.  HENT:  Normocephalic, atraumatic, External right and left ear normal.  EYES: Conjunctivae and EOM are normal. Pupils are equal, round, and reactive to light. No scleral icterus.  NECK: Normal range of motion, supple, no masses SKIN: Skin is warm and dry. No rash noted. Not diaphoretic. No erythema. No pallor. NEUROLOGIC: Alert and oriented to person, place, and time. Normal reflexes, muscle tone coordination. No cranial nerve deficit noted. PSYCHIATRIC: Normal mood and affect. Normal behavior. Normal judgment and thought content. CARDIOVASCULAR: Normal heart rate noted, regular rhythm RESPIRATORY: Effort and breath sounds normal, no problems with respiration noted MUSCULOSKELETAL: Normal range of motion. No edema and no tenderness. 2+ distal pulses. ABDOMEN: Soft, nontender, nondistended, gravid. CERVIX: Dilation: 2 Effacement (%): 60 Cervical Position: Middle Station: -2 Presentation: Vertex Exam by:: Dr. AHarolyn Rutherford Fetal monitoring: FHR: 140 bpm, Variability: moderate, Accelerations: Present, Decelerations: Absent  Uterine activity: No contractions  Significant Diagnostic Studies:  Results for orders placed or performed during the hospital encounter of 06/13/22 (from the past 168 hour(s))  Wet prep, genital   Collection Time: 06/13/22  9:49 AM   Specimen: Vaginal  Result Value Ref Range   Yeast Wet Prep HPF POC NONE SEEN NONE SEEN   Trich, Wet Prep NONE SEEN NONE SEEN   Clue Cells Wet Prep HPF POC NONE  SEEN NONE SEEN   WBC, Wet Prep HPF POC >=10 (A) <10   Sperm NONE SEEN   Type  and screen [place]   Collection Time: 06/13/22 10:24 AM  Result Value Ref Range   ABO/RH(D) AB POS    Antibody Screen NEG    Sample Expiration      06/16/2022,2359 Performed at [facility], [place] [address] [place], [place] [address]   Culture, beta strep (group b only)   Collection Time: 06/13/22 10:30 AM   Specimen: Vaginal/Rectal; Genital  Result Value Ref Range   Specimen Description VAGINAL/RECTAL    Special Requests NONE    Culture      NO GROUP B STREP (S.AGALACTIAE) ISOLATED Performed at [facility], [place] [address] [place], [place] [address]    Report Status 06/15/2022 FINAL   CBC   Collection Time: 06/13/22 10:30 AM  Result Value Ref Range   WBC 8.7 4.0 - 10.5 K/uL   RBC 4.45 3.87 - 5.11 MIL/uL   Hemoglobin 10.6 (L) 12.0 - 15.0 g/dL   HCT 33.1 (L) 36.0 - 46.0 %   MCV 74.4 (L) 80.0 - 100.0 fL   MCH 23.8 (L) 26.0 - 34.0 pg   MCHC 32.0 30.0 - 36.0 g/dL   RDW 13.7 11.5 - 15.5 %   Platelets 303 150 - 400 K/uL   nRBC 0.0 0.0 - 0.2 %  Glucose, capillary   Collection Time: 06/13/22  5:29 PM  Result Value Ref Range   Glucose-Capillary 79 70 - 99 mg/dL  Glucose, capillary   Collection Time: 06/13/22 10:25 PM  Result Value Ref Range   Glucose-Capillary 95 70 - 99 mg/dL  Glucose, capillary   Collection Time: 06/14/22  2:54 AM  Result Value Ref Range   Glucose-Capillary 96 70 - 99 mg/dL  Glucose, capillary   Collection Time: 06/14/22  8:08 AM  Result Value Ref Range   Glucose-Capillary 95 70 - 99 mg/dL  Glucose, capillary   Collection Time: 06/14/22 10:13 AM  Result Value Ref Range   Glucose-Capillary 105 (H) 70 - 99 mg/dL  Glucose, capillary   Collection Time: 06/14/22  4:38 PM  Result Value Ref Range   Glucose-Capillary 114 (H) 70 - 99 mg/dL  Glucose, capillary   Collection Time: 06/14/22  8:57 PM  Result Value Ref Range   Glucose-Capillary 118 (H) 70 - 99 mg/dL  Glucose, capillary   Collection Time: 06/15/22  8:47 AM   Result Value Ref Range   Glucose-Capillary 93 70 - 99 mg/dL  Glucose, capillary   Collection Time: 06/15/22 11:26 AM  Result Value Ref Range   Glucose-Capillary 124 (H) 70 - 99 mg/dL  Glucose, capillary   Collection Time: 06/15/22  2:58 PM  Result Value Ref Range   Glucose-Capillary 139 (H) 70 - 99 mg/dL  Glucose, capillary   Collection Time: 06/15/22  8:52 PM  Result Value Ref Range   Glucose-Capillary 117 (H) 70 - 99 mg/dL  Glucose, capillary   Collection Time: 06/16/22  5:34 AM  Result Value Ref Range   Glucose-Capillary 99 70 - 99 mg/dL  Glucose, capillary   Collection Time: 06/16/22 10:17 AM  Result Value Ref Range   Glucose-Capillary 153 (H) 70 - 99 mg/dL   [place] MFM OB LIMITED  Result Date: 06/13/2022 ----------------------------------------------------------------------  OBSTETRICS REPORT                       (Signed Final 06/13/2022  03:30 pm) ---------------------------------------------------------------------- Patient Info  ID #:       [id number]                          D.O.B.:  16-Jun-2001 (20 yrs)  Name:       Jonetta Speak                  Visit Date: 06/13/2022 10:02 am              [place] ---------------------------------------------------------------------- Performed By  Attending:        Johnell Comings MD         Ref. Address:     [address]                                                             [place], [place]  Performed By:     Stephenie Acres        [facility] [place]  Referred By:      Truett Mainland                    MD ---------------------------------------------------------------------- Orders  #  Description                           Code        Ordered By  1  [place] MFM OB LIMITED                     [phone]    MATTHEW ECKSTAT ----------------------------------------------------------------------  #  Order #                      Accession #                Episode #  1  [id number]                   [id number]                 [id number] ---------------------------------------------------------------------- Indications  Vaginal bleeding in pregnancy, third trimester O46.93  [redacted] weeks gestation of pregnancy                Z3A.35  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 Cord Insertion:      Previously Visualized  Amniotic Fluid  AFI FV:      Within normal limits  AFI Sum(cm)     %Tile       Largest Pocket(cm)  14.18           51          5.64  RUQ(cm)       RLQ(cm)       LUQ(cm)        LLQ(cm)  2.49          2.87          3.18           5.64  Comment:    No placental abruption or previa identified. ---------------------------------------------------------------------- Biometry  LV:        4.6  mm ---------------------------------------------------------------------- OB History  Gravidity:    2         Term:   1  Living:       0 ---------------------------------------------------------------------- Gestational Age  LMP:           36w 2d        Date:  10/02/21                 EDD:   07/09/22  Best:          35w 5d     Det. By:  U/S  (02/14/22)          EDD:   07/13/22 ---------------------------------------------------------------------- Anatomy  Cranium:               Appears normal         Kidneys:                Appear normal  Diaphragm:             Appears normal         Bladder:                Appears normal  Stomach:                Appears normal, left                         sided ---------------------------------------------------------------------- Cervix Uterus Adnexa  Cervix  Not visualized (advanced GA >24wks) ---------------------------------------------------------------------- Comments  This patient presented to the MAU due to vaginal bleeding.  A limited ultrasound performed today shows that the fetus is  in the vertex presentation.  There was normal amniotic fluid noted.  A normal-appearing anterior placenta is noted.  There were  no signs of a retroplacental clot or placenta previa noted  today. ----------------------------------------------------------------------                   Johnell Comings, MD Electronically Signed Final Report   06/13/2022 03:30 pm ----------------------------------------------------------------------  ECHOCARDIOGRAM COMPLETE  Result Date:  06/05/2022    ECHOCARDIOGRAM REPORT   Patient Name:   Jonetta Speak [facility] Date of Exam: 06/05/2022 Medical Rec #:  [id number]              Height:       62.0 in Accession #:    [id number]             Weight:       174.7 lb Date of Birth:  2000/11/05              BSA:          1.805 m Patient Age:    20 years               BP:           115/72 mmHg Patient Gender: F                      HR:           86 bpm. Exam Location:  Outpatient Procedure: 2D Echo, Cardiac Doppler, Color Doppler, 3D Echo and Strain Analysis Indications:    Diastolic Murmur O83  History:        Patient has prior history of Echocardiogram examinations, most                 recent 03/21/2022. Signs/Symptoms:Murmur.  Sonographer:    Mikki Santee RDCS Referring Phys: [id number] [place]  1. Left ventricular ejection fraction, by estimation, is 60 to 65%. The left ventricle has normal function. The left ventricle has no regional wall motion abnormalities. Left ventricular diastolic parameters were normal. The average left ventricular global longitudinal strain is -24.7 %. The global  longitudinal strain is normal.  2. Right ventricular systolic function is normal. The right ventricular size is normal.  3. The mitral valve is normal in structure. No evidence of mitral valve regurgitation. No evidence of mitral stenosis.  4. The aortic valve is normal in structure. Aortic valve regurgitation is not visualized. No aortic stenosis is present.  5. The inferior vena cava is normal in size with greater than 50% respiratory variability, suggesting right atrial pressure of 3 mmHg. FINDINGS  Left Ventricle: Left ventricular ejection fraction, by estimation, is 60 to 65%. The left ventricle has normal function. The left ventricle has no regional wall motion abnormalities. The average left ventricular global longitudinal strain is -24.7 %. The global longitudinal strain is normal. The left ventricular internal cavity size was normal in size. There is no left ventricular hypertrophy. Left ventricular diastolic parameters were normal. Right Ventricle: The right ventricular size is normal. No increase in right ventricular wall thickness. Right ventricular systolic function is normal. Left Atrium: Left atrial size was normal in size. Right Atrium: Right atrial size was normal in size. Pericardium: There is no evidence of pericardial effusion. Mitral Valve: The mitral valve is normal in structure. No evidence of mitral valve regurgitation. No evidence of mitral valve stenosis. Tricuspid Valve: The tricuspid valve is normal in structure. Tricuspid valve regurgitation is not demonstrated. No evidence of tricuspid stenosis. Aortic Valve: The aortic valve is normal in structure. Aortic valve regurgitation is not visualized. No aortic stenosis is present. Pulmonic Valve: The pulmonic valve was normal in structure. Pulmonic valve regurgitation is not visualized. No evidence of pulmonic stenosis. Aorta: The aortic root is normal in size and structure. Venous: The inferior vena cava is normal in size with greater than  50% respiratory variability, suggesting right atrial pressure of  3 mmHg. IAS/Shunts: No atrial level shunt detected by color flow Doppler.  LEFT VENTRICLE PLAX 2D LVIDd:         4.10 cm   Diastology LVIDs:         2.80 cm   LV e' medial:    7.29 cm/s LV PW:         0.80 cm   LV E/e' medial:  10.9 LV IVS:        0.90 cm   LV e' lateral:   13.80 cm/s LVOT diam:     1.90 cm   LV E/e' lateral: 5.8 LV SV:         56 LV SV Index:   31        2D Longitudinal Strain LVOT Area:     2.84 cm  2D Strain GLS Avg:     -24.7 %                           3D Volume EF:                          3D EF:        58 %                          LV EDV:       128 ml                          LV ESV:       54 ml                          LV SV:        74 ml RIGHT VENTRICLE RV Basal diam:  3.00 cm RV S prime:     11.90 cm/s TAPSE (M-mode): 2.2 cm LEFT ATRIUM             Index        RIGHT ATRIUM           Index LA diam:        2.80 cm 1.55 cm/m   RA Area:     15.30 cm LA Vol (A2C):   40.8 ml 22.61 ml/m  RA Volume:   39.40 ml  21.83 ml/m LA Vol (A4C):   33.1 ml 18.34 ml/m LA Biplane Vol: 40.2 ml 22.27 ml/m  AORTIC VALVE LVOT Vmax:   111.00 cm/s LVOT Vmean:  75.600 cm/s LVOT VTI:    0.197 m  AORTA Ao Root diam: 2.30 cm Ao Asc diam:  2.50 cm MITRAL VALVE MV Area (PHT): 4.21 cm    SHUNTS MV Decel Time: 180 msec    Systemic VTI:  0.20 m MV E velocity: 79.70 cm/s  Systemic Diam: 1.90 cm MV A velocity: 60.00 cm/s MV E/A ratio:  1.33 Mihai Croitoru MD Electronically signed by Sanda Klein MD Signature Date/Time: 06/05/2022/2:34:35 PM    Final    [place] MFM OB FOLLOW UP  Result Date: 05/24/2022 ----------------------------------------------------------------------  OBSTETRICS REPORT                       (Signed Final 05/24/2022 04:07 pm) ---------------------------------------------------------------------- Patient Info  ID #:       [id number]  D.O.B.:  Apr 02, 2001 (20 yrs)  Name:       Jonetta Speak                  Visit  Date: 05/24/2022 03:41 pm              [place] ---------------------------------------------------------------------- Performed By  Attending:        Sander Nephew      Ref. Address:     [address]                    MD                                                             [place], [place]  Performed By:     Rolm Bookbinder RDMS     [facility]                                                             [place] [facility]                                                             [facility]  Referred By:      Truett Mainland                    MD ---------------------------------------------------------------------- Orders  #  Description                           Code        Ordered By  1  [place] MFM OB FOLLOW UP                   62130.86    MATTHEW ECKSTAT ----------------------------------------------------------------------  #  Order #                     Accession #                Episode #  1  [id number]  [id number]                 [id number] ---------------------------------------------------------------------- Indications  [redacted] weeks gestation of pregnancy                Z3A.32  Encounter for antenatal screening for          Z36.3  malformations  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)            L89.2  Medical complication of pregnancy (heart       O26.90  murmur, Chlamydia)  Other mental disorder complicating             O99.340  pregnancy, second trimester  Herpes simplex virus (HSV)                     O98.519 B00.9  LR NIPS, Neg AFP  [redacted] weeks gestation of pregnancy                Z3A.18 ---------------------------------------------------------------------- Vital Signs                            Pulse:  98  BP:          115/72 ---------------------------------------------------------------------- Fetal Evaluation  Num Of Fetuses:          1  Cardiac Activity:       Observed  Presentation:           Cephalic  Placenta:               Anterior  P. Cord Insertion:      Previously Visualized  Amniotic Fluid  AFI FV:      Within normal limits  AFI Sum(cm)     %Tile       Largest Pocket(cm)  11.57           29          4.47  RUQ(cm)       RLQ(cm)       LUQ(cm)        LLQ(cm)  4.47          2.34          2.05           2.71 ---------------------------------------------------------------------- Biometry  BPD:     87.19  mm     G. Age:  35w 1d         95  %    CI:        76.09   %    70 - 86                                                          FL/HC:      20.2   %    19.9 - 21.5  HC:    316.79   mm     G. Age:  35w 4d         83  %    HC/AC:      1.15        0.96 - 1.11  AC:    275.31   mm     G. Age:  31w 4d         17  %    FL/BPD:  73.4   %    71 - 87  FL:      63.99  mm     G. Age:  33w 0d         43  %    FL/AC:      23.2   %    20 - 24  Est. FW:    2039  gm      4 lb 8 oz     37  % ---------------------------------------------------------------------- OB History  Gravidity:    2         Term:   1  Living:       0 ---------------------------------------------------------------------- Gestational Age  LMP:           33w 3d        Date:  10/02/21                 EDD:   07/09/22  U/S Today:     33w 6d                                        EDD:   07/06/22  Best:          32w 6d     Det. By:  U/S  (02/14/22)          EDD:   07/13/22 ---------------------------------------------------------------------- Anatomy  Cranium:               Appears normal         Aortic Arch:            Previously seen  Cavum:                 Appears normal         Ductal Arch:            Appears normal  Ventricles:            Appears normal         Diaphragm:              Appears normal  Choroid Plexus:        Appears normal         Stomach:                Appears normal, left                                                                        sided  Cerebellum:             Appears normal         Abdomen:                Appears normal  Posterior Fossa:       Appears normal         Abdominal Wall:         Appears nml (cord  insert, abd wall)  Nuchal Fold:           Previously seen        Cord Vessels:           Appears normal (3                                                                        vessel cord)  Face:                  Orbits and profile     Kidneys:                Appear normal                         previously seen  Lips:                  Previously seen        Bladder:                Appears normal  Thoracic:              Appears normal         Spine:                  Previously seen  Heart:                 Appears normal         Upper Extremities:      Previously seen                         (4CH, axis, and                         situs)  RVOT:                  Appears normal         Lower Extremities:      Previously seen  LVOT:                  Appears normal  Other:  Hands and feet visualized. Nasal bone, lenses, maxilla, mandible and          falx visualized. Fetus appears to be a female. ---------------------------------------------------------------------- Impression  Follow up growth to complete the fetal anatomy  Normal interval growth with measurements consistent with  dates  Good fetal movement and amniotic fluid volume  Fetal anatomy is cleared today. ---------------------------------------------------------------------- Recommendations  Follow up as clinically indicated. ----------------------------------------------------------------------              Sander Nephew, MD Electronically Signed Final Report   05/24/2022 04:07 pm ----------------------------------------------------------------------   Future Appointments  Date Time Provider [place]  06/21/2022 10:15 AM Clarnce Flock, MD [facility]  06/28/2022 11:15 AM Clarnce Flock, MD [facility]  07/10/2022 10:55  AM Clarnce Flock, MD [facility]  07/15/2022 11:15 AM [facility]  07/16/2022 10:15 AM Clarnce Flock, MD [facility]  08/09/2022  2:00 PM Tobb, Godfrey Pick, DO [facility] None    Discharge Condition: Stable  Discharge disposition:  01-Home or Self Care       Discharge Instructions     Discharge activity:  No Restrictions   Complete by: As directed    Discharge diet:  No restrictions   Complete by: As directed    Fetal Kick Count:  Lie on our left side for one hour after a meal, and count the number of times your baby kicks.  If it is less than 5 times, get up, move around and drink some juice.  Repeat the test 30 minutes later.  If it is still less than 5 kicks in an hour, notify your doctor.   Complete by: As directed    Notify physician for a general feeling that "something is not right"   Complete by: As directed    Notify physician for leaking of fluid   Complete by: As directed    Notify physician for pelvic pressure   Complete by: As directed    Notify physician for uterine contractions.  These may be painless and feel like the uterus is tightening or the baby is  "balling up"   Complete by: As directed    Notify physician for vaginal bleeding   Complete by: As directed    PRETERM LABOR:  Includes any of the follwing symptoms that occur between 20 - [redacted] weeks gestation.  If these symptoms are not stopped, preterm labor can result in preterm delivery, placing your baby at risk   Complete by: As directed    Sexual Activity:     Complete by: As directed    Please avoid intercourse for now      Allergies as of 06/16/2022       Reactions   Nitrous Oxide Other (See Comments)   "siezures," per pt        Medication List     STOP taking these medications    metroNIDAZOLE 500 MG tablet Commonly known as: FLAGYL       TAKE these medications    Accu-Chek Guide test strip Generic drug: glucose blood Use as instructed   Accu-Chek Guide w/Device  Kit 1 Device by Does not apply route in the morning, at noon, in the evening, and at bedtime.   Accu-Chek Softclix Lancets lancets Use four times daily as instructed.   acetaminophen 325 MG tablet Commonly known as: Tylenol Take 2 tablets (650 mg total) by mouth every 6 (six) hours as needed.   Blood Pressure Kit Devi 1 Device by Does not apply route once a week.   cyclobenzaprine 10 MG tablet Commonly known as: FLEXERIL Take 1 tablet (10 mg total) by mouth every 8 (eight) hours as needed for muscle spasms.   ferrous sulfate 325 (65 FE) MG tablet Commonly known as: FerrouSul Take 1 tablet (325 mg total) by mouth every other day.   hydrOXYzine 25 MG capsule Commonly known as: Vistaril Take 1 capsule by mouth 3 times daily as needed.   multivitamin-prenatal 27-0.8 MG Tabs tablet Take 1 tablet by mouth daily at 12 noon.   NIFEdipine 30 MG 24 hr tablet Commonly known as: ADALAT CC Take 1 tablet (30 mg total) by mouth 2 (two) times daily. For symptomatic contractions   sertraline 25 MG tablet Commonly known as: Zoloft Take 1 tablet (25 mg total) by mouth daily.   valACYclovir 500 MG tablet Commonly known as: VALTREX Take 1 tablet (500 mg total) by mouth 2 (two) times daily.         Total discharge time: 20 minutes  Signed: Verita Schneiders M.D. 06/16/2022, 1:13 PM

## 2022-06-17 ENCOUNTER — Other Ambulatory Visit (HOSPITAL_COMMUNITY): Payer: Self-pay

## 2022-06-18 ENCOUNTER — Other Ambulatory Visit (HOSPITAL_COMMUNITY): Payer: Self-pay

## 2022-06-21 ENCOUNTER — Other Ambulatory Visit (HOSPITAL_COMMUNITY): Payer: Self-pay

## 2022-06-21 ENCOUNTER — Other Ambulatory Visit (HOSPITAL_COMMUNITY)
Admission: RE | Admit: 2022-06-21 | Discharge: 2022-06-21 | Disposition: A | Discharge status: Home / Self Care | Payer: Medicaid Other | Source: Ambulatory Visit | Attending: Family Medicine | Admitting: Family Medicine

## 2022-06-21 ENCOUNTER — Ambulatory Visit (INDEPENDENT_AMBULATORY_CARE_PROVIDER_SITE_OTHER): Payer: Medicaid Other | Admitting: Family Medicine

## 2022-06-21 ENCOUNTER — Encounter: Payer: Self-pay | Admitting: Family Medicine

## 2022-06-21 ENCOUNTER — Other Ambulatory Visit: Payer: Self-pay

## 2022-06-21 VITALS — BP 123/87 | HR 114 | Wt 177.1 lb

## 2022-06-21 DIAGNOSIS — O0990 Supervision of high risk pregnancy, unspecified, unspecified trimester: Secondary | ICD-10-CM | POA: Insufficient documentation

## 2022-06-21 DIAGNOSIS — B009 Herpesviral infection, unspecified: Secondary | ICD-10-CM

## 2022-06-21 DIAGNOSIS — O24410 Gestational diabetes mellitus in pregnancy, diet controlled: Secondary | ICD-10-CM

## 2022-06-21 DIAGNOSIS — F4329 Adjustment disorder with other symptoms: Secondary | ICD-10-CM

## 2022-06-21 DIAGNOSIS — O4693 Antepartum hemorrhage, unspecified, third trimester: Secondary | ICD-10-CM

## 2022-06-21 DIAGNOSIS — O98513 Other viral diseases complicating pregnancy, third trimester: Secondary | ICD-10-CM

## 2022-06-21 MED ORDER — VALACYCLOVIR HCL 1 G PO TABS
1000.0000 mg | ORAL_TABLET | Freq: Every day | ORAL | 2 refills | Status: DC
  Filled 2022-06-21: qty 30, 30d supply, fill #0

## 2022-06-21 NOTE — Patient Instructions (Signed)

## 2022-06-21 NOTE — Progress Notes (Signed)
Subjective:  Janet Horn is a 21 y.o. G2P0010 at [redacted]w[redacted]d being seen today for ongoing prenatal care.  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  Patient reports no complaints.  Contractions: Irritability. Vag. Bleeding: None.  Movement: Present. Denies leaking of fluid.   The following portions of the patient's history were reviewed and updated as appropriate: allergies, current medications, past family history, past medical history, past social history, past surgical history and problem list. Problem list updated.  Objective:   Vitals:   06/21/22 1020  BP: 123/87  Pulse: (!) 114  Weight: 177 lb 1.6 oz (80.3 kg)    Fetal Status: Fetal Heart Rate (bpm): 141   Movement: Present  Presentation: Vertex  General:  Alert, oriented and cooperative. Patient is in no acute distress.  Skin: Skin is warm and dry. No rash noted.   Cardiovascular: Normal heart rate noted  Respiratory: Normal respiratory effort, no problems with respiration noted  Abdomen: Soft, gravid, appropriate for gestational age. Pain/Pressure: Present     Pelvic: Vag. Bleeding: None     Cervical exam performed Dilation: 2 Effacement (%): 80 Station: -2  Extremities: Normal range of motion.  Edema: Trace  Mental Status: Normal mood and affect. Normal behavior. Normal judgment and thought content.   Urinalysis:       Assessment and Plan:  Pregnancy: G2P0010 at [redacted]w[redacted]d  1. Supervision of high risk pregnancy, antepartum BP and FHR normal Swab collected for GC/CT, already had negative GBS swab during recent admission Cervix remains unchanged from admission  2. Diet controlled gestational diabetes mellitus (GDM) in third trimester Log reviewed, doing well Most recent US w EFW 37%, AFI 11.5, this was on 05/24/2022  3. Adjustment disorder with mixed emotional features Still having a tough time, living in hotel but not going to be able to pay for it soon because she is exiting the foster system, SNAP benefits may be running out soon, she has no reliable form of transportation VERY stressed about all this Connected with patient navigator Misty Stanley to help with resources  4. 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 Vaginal bleeding in pregnancy, third trimester Admitted to hospital last month with bleeding and cervical change Reports no bleeding since discharge Still having intermittent contractions, not as bad Has not yet gotten nifedipine, will get it later today and start taking it  Preterm labor symptoms and general obstetric precautions including but not limited to vaginal bleeding, contractions, leaking of fluid and fetal movement were reviewed in detail with the patient. Please refer to After Visit  Summary for other counseling recommendations.  Return in 1 week (on 06/28/2022) for Dyad patient, ob visit.   Venora Maples,  MD

## 2022-06-24 LAB — GC/CHLAMYDIA PROBE AMP (~~LOC~~) NOT AT ARMC
Chlamydia: NEGATIVE
Comment: NEGATIVE
Comment: NORMAL
Neisseria Gonorrhea: NEGATIVE

## 2022-06-28 ENCOUNTER — Other Ambulatory Visit: Payer: Self-pay

## 2022-06-28 ENCOUNTER — Encounter: Payer: Self-pay | Admitting: Family Medicine

## 2022-06-28 ENCOUNTER — Ambulatory Visit (INDEPENDENT_AMBULATORY_CARE_PROVIDER_SITE_OTHER): Payer: Medicaid Other | Admitting: Family Medicine

## 2022-06-28 VITALS — BP 114/74 | HR 81 | Wt 178.0 lb

## 2022-06-28 DIAGNOSIS — B009 Herpesviral infection, unspecified: Secondary | ICD-10-CM

## 2022-06-28 DIAGNOSIS — O24410 Gestational diabetes mellitus in pregnancy, diet controlled: Secondary | ICD-10-CM

## 2022-06-28 DIAGNOSIS — O98513 Other viral diseases complicating pregnancy, third trimester: Secondary | ICD-10-CM

## 2022-06-28 DIAGNOSIS — Z23 Encounter for immunization: Secondary | ICD-10-CM

## 2022-06-28 DIAGNOSIS — F1991 Other psychoactive substance use, unspecified, in remission: Secondary | ICD-10-CM

## 2022-06-28 DIAGNOSIS — O0990 Supervision of high risk pregnancy, unspecified, unspecified trimester: Secondary | ICD-10-CM

## 2022-06-28 NOTE — Progress Notes (Signed)
   PRENATAL VISIT NOTE  Subjective:  Janet Horn is a 21 y.o. G2P0010 at [redacted]w[redacted]d being seen today for ongoing prenatal care.  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  Patient reports no complaints.  Contractions: Irritability. Vag. Bleeding: None.  Movement: Present. Denies leaking of fluid.   The following portions of the patient's history were reviewed and updated as appropriate: allergies, current medications, past family history, past medical history, past social history, past surgical history and problem list.   Objective:   Vitals:   06/28/22 1135  BP: 114/74  Pulse: 81  Weight: 178 lb (80.7 kg)    Fetal Status: Fetal Heart Rate (bpm): 141 Fundal Height: 38 cm Movement: Present  Presentation: Vertex  General:  Alert, oriented and cooperative. Patient is in no acute distress.  Skin: Skin is warm and dry. No rash noted.   Cardiovascular: Normal heart rate noted  Respiratory: Normal respiratory effort, no problems with respiration noted  Abdomen: Soft, gravid, appropriate for gestational age.  Pain/Pressure: Present     Pelvic: Cervical exam performed in the presence of a chaperone Dilation: 2 Effacement (%): 80 Station: -2  Extremities: Normal range of motion.  Edema: Trace  Mental Status: Normal mood and affect. Normal behavior. Normal judgment and thought content.    Assessment and Plan:  Pregnancy: G2P0010 at [redacted]w[redacted]d 1. Supervision of high risk pregnancy, antepartum Continue prenatal care.  2. History of drug use None recently  3. 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 Diet controlled gestational diabetes mellitus (GDM) in third trimester Fasting 90s, after meals 115-120 no log today Last growth at 37%, IOL at 40 weeks  Preterm labor symptoms and general obstetric precautions including but not limited to vaginal bleeding, contractions, leaking of fluid and fetal movement were reviewed in detail with the patient. Please refer to After Visit Summary for other counseling recommendations.   Return in 1 week (on 07/05/2022).  Future Appointments  Date Time Provider Department Center  07/02/2022  3:15 PM [facility]  07/10/2022 10:55 AM Venora Maples, MD [facility]  07/15/2022 11:15 AM [facility]  07/16/2022 10:15 AM Venora Maples, MD [facility]  08/09/2022  2:00 PM Tobb, Lavona Mound, DO [facility] None    Reva Bores, MD

## 2022-07-02 ENCOUNTER — Other Ambulatory Visit: Payer: Medicaid Other

## 2022-07-05 ENCOUNTER — Encounter: Payer: Self-pay | Admitting: Medical

## 2022-07-05 ENCOUNTER — Encounter: Payer: Self-pay | Admitting: Family Medicine

## 2022-07-05 ENCOUNTER — Inpatient Hospital Stay (HOSPITAL_COMMUNITY): Payer: Medicaid Other | Admitting: Anesthesiology

## 2022-07-05 ENCOUNTER — Inpatient Hospital Stay (HOSPITAL_COMMUNITY)
Admission: AD | Admit: 2022-07-05 | Discharge: 2022-07-07 | DRG: 806 | Disposition: A | Discharge status: Home / Self Care | Payer: Medicaid Other | Attending: Obstetrics and Gynecology | Admitting: Obstetrics and Gynecology

## 2022-07-05 ENCOUNTER — Encounter (HOSPITAL_COMMUNITY): Payer: Self-pay | Admitting: Family Medicine

## 2022-07-05 DIAGNOSIS — Z3A38 38 weeks gestation of pregnancy: Secondary | ICD-10-CM | POA: Diagnosis not present

## 2022-07-05 DIAGNOSIS — B009 Herpesviral infection, unspecified: Secondary | ICD-10-CM | POA: Diagnosis present

## 2022-07-05 DIAGNOSIS — F1991 Other psychoactive substance use, unspecified, in remission: Principal | ICD-10-CM

## 2022-07-05 DIAGNOSIS — F4329 Adjustment disorder with other symptoms: Secondary | ICD-10-CM

## 2022-07-05 DIAGNOSIS — O24419 Gestational diabetes mellitus in pregnancy, unspecified control: Secondary | ICD-10-CM | POA: Diagnosis present

## 2022-07-05 DIAGNOSIS — O24420 Gestational diabetes mellitus in childbirth, diet controlled: Secondary | ICD-10-CM | POA: Diagnosis present

## 2022-07-05 DIAGNOSIS — F319 Bipolar disorder, unspecified: Secondary | ICD-10-CM | POA: Diagnosis present

## 2022-07-05 DIAGNOSIS — O9832 Other infections with a predominantly sexual mode of transmission complicating childbirth: Secondary | ICD-10-CM | POA: Diagnosis present

## 2022-07-05 DIAGNOSIS — O0990 Supervision of high risk pregnancy, unspecified, unspecified trimester: Secondary | ICD-10-CM

## 2022-07-05 DIAGNOSIS — A6000 Herpesviral infection of urogenital system, unspecified: Secondary | ICD-10-CM | POA: Diagnosis present

## 2022-07-05 DIAGNOSIS — F603 Borderline personality disorder: Secondary | ICD-10-CM | POA: Diagnosis present

## 2022-07-05 DIAGNOSIS — O99344 Other mental disorders complicating childbirth: Secondary | ICD-10-CM | POA: Diagnosis present

## 2022-07-05 DIAGNOSIS — F3130 Bipolar disorder, current episode depressed, mild or moderate severity, unspecified: Secondary | ICD-10-CM | POA: Diagnosis present

## 2022-07-05 DIAGNOSIS — O26893 Other specified pregnancy related conditions, third trimester: Secondary | ICD-10-CM | POA: Diagnosis present

## 2022-07-05 HISTORY — DX: Gestational diabetes mellitus in pregnancy, unspecified control: O24.419

## 2022-07-05 LAB — CBC
HCT: 38.8 % (ref 36.0–46.0)
Hemoglobin: 12.3 g/dL (ref 12.0–15.0)
MCH: 23.1 pg — ABNORMAL LOW (ref 26.0–34.0)
MCHC: 31.7 g/dL (ref 30.0–36.0)
MCV: 72.8 fL — ABNORMAL LOW (ref 80.0–100.0)
Platelets: 363 10*3/uL (ref 150–400)
RBC: 5.33 MIL/uL — ABNORMAL HIGH (ref 3.87–5.11)
RDW: 14.5 % (ref 11.5–15.5)
WBC: 11.7 10*3/uL — ABNORMAL HIGH (ref 4.0–10.5)
nRBC: 0 % (ref 0.0–0.2)

## 2022-07-05 LAB — TYPE AND SCREEN
ABO/RH(D): AB POS
Antibody Screen: NEGATIVE

## 2022-07-05 MED ORDER — LIDOCAINE HCL (PF) 1 % IJ SOLN
INTRAMUSCULAR | Status: DC | PRN
  Administered 2022-07-05: 5 mL via EPIDURAL
  Administered 2022-07-05: 2 mL via EPIDURAL
  Administered 2022-07-05: 3 mL via EPIDURAL

## 2022-07-05 MED ORDER — OXYTOCIN-SODIUM CHLORIDE 30-0.9 UT/500ML-% IV SOLN
2.5000 [IU]/h | INTRAVENOUS | Status: DC
  Filled 2022-07-05: qty 500

## 2022-07-05 MED ORDER — FLEET ENEMA 7-19 GM/118ML RE ENEM: 1.0000 | ENEMA | RECTAL | Status: DC | PRN

## 2022-07-05 MED ORDER — MISOPROSTOL 25 MCG QUARTER TABLET: 25.0000 ug | ORAL_TABLET | Freq: Once | ORAL | Status: DC

## 2022-07-05 MED ORDER — EPHEDRINE 5 MG/ML INJ: 10.0000 mg | INTRAVENOUS | Status: DC | PRN

## 2022-07-05 MED ORDER — PHENYLEPHRINE 80 MCG/ML (10ML) SYRINGE FOR IV PUSH (FOR BLOOD PRESSURE SUPPORT): 80.0000 ug | PREFILLED_SYRINGE | INTRAVENOUS | Status: DC | PRN

## 2022-07-05 MED ORDER — DIPHENHYDRAMINE HCL 50 MG/ML IJ SOLN: 12.5000 mg | INTRAMUSCULAR | Status: DC | PRN

## 2022-07-05 MED ORDER — LACTATED RINGERS IV SOLN
500.0000 mL | Freq: Once | INTRAVENOUS | Status: AC
  Administered 2022-07-05: 500 mL via INTRAVENOUS

## 2022-07-05 MED ORDER — OXYCODONE-ACETAMINOPHEN 5-325 MG PO TABS: 2.0000 | ORAL_TABLET | ORAL | Status: DC | PRN

## 2022-07-05 MED ORDER — SERTRALINE HCL 25 MG PO TABS: 25.0000 mg | ORAL_TABLET | Freq: Every day | ORAL | Status: DC

## 2022-07-05 MED ORDER — FENTANYL-BUPIVACAINE-NACL 0.5-0.125-0.9 MG/250ML-% EP SOLN
12.0000 mL/h | EPIDURAL | Status: DC | PRN
  Administered 2022-07-05: 12 mL/h via EPIDURAL

## 2022-07-05 MED ORDER — VALACYCLOVIR HCL 500 MG PO TABS: 1000.0000 mg | ORAL_TABLET | Freq: Every day | ORAL | Status: DC

## 2022-07-05 MED ORDER — FENTANYL-BUPIVACAINE-NACL 0.5-0.125-0.9 MG/250ML-% EP SOLN
EPIDURAL | Status: AC
  Filled 2022-07-05: qty 250

## 2022-07-05 MED ORDER — ONDANSETRON HCL 4 MG/2ML IJ SOLN
4.0000 mg | Freq: Four times a day (QID) | INTRAMUSCULAR | Status: DC | PRN
  Administered 2022-07-05: 4 mg via INTRAVENOUS
  Filled 2022-07-05: qty 2

## 2022-07-05 MED ORDER — TERBUTALINE SULFATE 1 MG/ML IJ SOLN: 0.2500 mg | Freq: Once | INTRAMUSCULAR | Status: DC | PRN

## 2022-07-05 MED ORDER — LIDOCAINE HCL (PF) 1 % IJ SOLN: 30.0000 mL | INTRAMUSCULAR | Status: DC | PRN

## 2022-07-05 MED ORDER — MISOPROSTOL 50MCG HALF TABLET: 50.0000 ug | ORAL_TABLET | Freq: Once | ORAL | Status: DC

## 2022-07-05 MED ORDER — SOD CITRATE-CITRIC ACID 500-334 MG/5ML PO SOLN: 30.0000 mL | ORAL | Status: DC | PRN

## 2022-07-05 MED ORDER — ACETAMINOPHEN 325 MG PO TABS: 650.0000 mg | ORAL_TABLET | ORAL | Status: DC | PRN

## 2022-07-05 MED ORDER — SERTRALINE HCL 25 MG PO TABS
25.0000 mg | ORAL_TABLET | Freq: Every day | ORAL | Status: DC
  Administered 2022-07-06 – 2022-07-07 (×2): 25 mg via ORAL
  Filled 2022-07-05 (×2): qty 1

## 2022-07-05 MED ORDER — LACTATED RINGERS IV SOLN: 500.0000 mL | INTRAVENOUS | Status: DC | PRN

## 2022-07-05 MED ORDER — LACTATED RINGERS IV SOLN: 500.0000 mL | Freq: Once | INTRAVENOUS | Status: DC

## 2022-07-05 MED ORDER — OXYCODONE-ACETAMINOPHEN 5-325 MG PO TABS: 1.0000 | ORAL_TABLET | ORAL | Status: DC | PRN

## 2022-07-05 MED ORDER — OXYTOCIN BOLUS FROM INFUSION
333.0000 mL | Freq: Once | INTRAVENOUS | Status: DC
  Administered 2022-07-05: 333 mL via INTRAVENOUS

## 2022-07-05 MED ORDER — LACTATED RINGERS IV SOLN: INTRAVENOUS | Status: DC

## 2022-07-05 NOTE — MAU Note (Addendum)
...  Janet Horn is a 21 y.o. at [redacted]w[redacted]d here in MAU via EMS reporting: Patient arrived via EMS complaining of CTX since 1400. She reports she also lost her mucous plug around this time. Patient yelling while in pain upon arrival to MAU patient reports she is feeling "so much pressure" with each contraction. She denies complications with her pregnancy. GBS-. Denies VB or LOF. +FM.  Patient very adamant about getting her mother from the [place].  Patient not yet registered in the system at the time of monitors being applied and VS taken.   At 1518 when patient arrived, Maryagnes Amos, CNM, and Gaylan Gerold, CNM, at bedside.   SVE performed at 1519. Patient found to be 6-7 cm with a bulging bag with undeterminable presentation. Bedside U/S performed and vertex presentation confirmed. CNM stated she would call [facility] for admission orders. Roderic Scarce, RN, called Robyne Askew, RN and report given.  At 1524: BP: 144/85 P: 102 T: 97.4 oral R: 19 O2: 100  Onset of complaint: 1400 Pain score: 10/10 lower abdomen  FHT: 164 initial external

## 2022-07-05 NOTE — Progress Notes (Signed)
Labor Progress Note Janet Horn is a 21 y.o. G2P0010 at [redacted]w[redacted]d presented for SOL.   S: Doing well. No acute concerns. Continue to feel a trickle of fluid.   O:  BP 119/64   Pulse 86   Temp 98.7 F (37.1 C) (Oral)   Resp 16   LMP 10/02/2021 (Approximate)   SpO2 100%  EFM: 115bpm/moderate/+accels, no decels  CVE: Dilation: 6.5 Presentation: Vertex (confirmed by bedside) Exam by:: Janet Horn, CNM   A&P: 21 y.o. G2P0010 [redacted]w[redacted]d here for SOL.   #Labor: Plan next cervical exam after foley catheter placement. Consider the need for pit.  #Pain: Epidural #FWB: Cat I #GBS negative  GDM -q2h CBGs  Janet Rubinstein Autry-Lott, DO 7:55 PM

## 2022-07-05 NOTE — Lactation Note (Signed)
This note was copied from a baby's chart. Lactation Consultation Note  Patient Name: [name] OEHOZ'Y Date: 07/05/2022 Reason for consult: L&D Initial assessment;Primapara;Early term 37-38.6wks Age:21 hours Assisted baby to the breast in cradle position, then laid back position, then in football position. The baby has a really strong tight suck. Mom stated it felt like he was biting. Mom asking to get him off d/t pain. [place] unlacted baby. Baby hungry smacking lips. LC spoon fed colostrum that flowed well from breast. Baby took colostrum well from spoon. Mom stated she can pump and bottle feed and if she doesn't have enough BM then she can give formula as well. Mom will try again tomorrow to latch to see if the baby keeps wanting to "bite" her. Explained the baby is learning as well as she is. Needs lips flanged frequently to try to loosen suck. Mom says she is so happy and thinks her baby is so handsome. Will f/u mom on MBU.  Maternal Data Has patient been taught Hand Expression?: Yes Does the patient have breastfeeding experience prior to this delivery?: No  Feeding Mother's Current Feeding Choice: Breast Milk and Formula  LATCH Score Latch: Grasps breast easily, tongue down, lips flanged, rhythmical sucking. (suck to hard and felt like he was biting. couldn't tolerate the BF.)  Audible Swallowing: None  Type of Nipple: Everted at rest and after stimulation  Comfort (Breast/Nipple): Soft / non-tender  Hold (Positioning): Full assist, staff holds infant at breast  LATCH Score: 6   Lactation Tools Discussed/Used    Interventions Interventions: Breast feeding basics reviewed;Adjust position;Assisted with latch;Support pillows;Skin to skin;Position options;Breast massage;Expressed milk;Hand express;Breast compression  Discharge    Consult Status Consult Status: Follow-up from L&D Date: 07/06/22 Follow-up type: In-patient    Theodoro Kalata 07/05/2022, 11:41  PM
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
## 2022-07-05 NOTE — Anesthesia Preprocedure Evaluation (Signed)
Anesthesia Evaluation  Patient identified by MRN, date of birth, ID band Patient awake    Reviewed: Allergy & Precautions, NPO status , Patient's Chart, lab work & pertinent test results  Airway Mallampati: II  TM Distance: >3 FB     Dental   Pulmonary neg pulmonary ROS,    Pulmonary exam normal        Cardiovascular negative cardio ROS Normal cardiovascular exam     Neuro/Psych negative neurological ROS     GI/Hepatic negative GI ROS, Neg liver ROS,   Endo/Other  diabetes, Gestational  Renal/GU negative Renal ROS     Musculoskeletal   Abdominal   Peds  Hematology negative hematology ROS (+)   Anesthesia Other Findings   Reproductive/Obstetrics (+) Pregnancy                             Lab Results  Component Value Date   WBC 11.7 (H) 07/05/2022   HGB 12.3 07/05/2022   HCT 38.8 07/05/2022   MCV 72.8 (L) 07/05/2022   PLT 363 07/05/2022   Lab Results  Component Value Date   CREATININE 0.53 03/21/2022   BUN 7 03/21/2022   NA 137 03/21/2022   K 3.7 03/21/2022   CL 106 03/21/2022   CO2 22 03/21/2022    Anesthesia Physical Anesthesia Plan  ASA: 2  Anesthesia Plan: Epidural   Post-op Pain Management:    Induction:   PONV Risk Score and Plan: 2 and Treatment may vary due to age or medical condition  Airway Management Planned: Natural Airway  Additional Equipment:   Intra-op Plan:   Post-operative Plan:   Informed Consent: I have reviewed the patients History and Physical, chart, labs and discussed the procedure including the risks, benefits and alternatives for the proposed anesthesia with the patient or authorized representative who has indicated his/her understanding and acceptance.       Plan Discussed with:   Anesthesia Plan Comments:         Anesthesia Quick Evaluation

## 2022-07-05 NOTE — Discharge Summary (Signed)
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 yo G2P0010 at 361w6das admitted in Active Labor on 07/05/2022. Patient had an uncomplicated labor course; there were no s/s of active HSV on admission per exam.   Membrane Rupture Time/Date: 9:19 PM ,07/05/2022   Delivery Method:Vaginal, Spontaneous  Episiotomy: None  Lacerations:  None  Patient had an uncomplicated postpartum course.  She is ambulating, tolerating a regular diet, passing flatus, and urinating well. PPD#1 fasting CBG was ***; she had a SW consult with no barriers to d/c *** Patient is discharged home in stable condition on 07/06/22.  Newborn Data: Birth date:07/05/2022  Birth time:10:04 PM  Gender:Female  Living status:Living  Apgars:9 ,9  Weight:3410 g (7lb 8.3oz)  Magnesium Sulfate received: No BMZ received: No Rhophylac:N/A MMR:N/A T-DaP:Given prenatally Flu: Yes Transfusion:No  Physical exam  Vitals:   07/05/22 2331 07/05/22 2346 07/06/22 0001 07/06/22 0039  BP: (!) 136/59 115/62 (!) 112/58 129/64  Pulse: 95 78 73 70  Resp:    18  Temp:    98.7 F (37.1 C)  TempSrc:    Oral  SpO2:       General: {Exam; general:21111117} Lochia: {Desc;  appropriate/inappropriate:30686::"appropriate"} Uterine Fundus: {Desc; firm/soft:30687} Incision: {Exam; incision:21111123} DVT Evaluation: {Exam; dvt:2111122} Labs: Lab Results  Component Value Date   WBC 11.7 (H) 07/05/2022   HGB 12.3 07/05/2022   HCT 38.8 07/05/2022   MCV 72.8 (L) 07/05/2022   PLT 363 07/05/2022      Latest Ref Rng & Units 03/21/2022    4:00 PM  CMP  Glucose 70 - 99 mg/dL 84   BUN 6 - 20 mg/dL 7   Creatinine 0.44 - 1.00 mg/dL 0.53   Sodium 135 - 145 mmol/L 137   Potassium 3.5 - 5.1 mmol/L 3.7   Chloride 98 - 111 mmol/L 106   CO2 22 - 32 mmol/L 22   Calcium 8.9 - 10.3 mg/dL 9.6   Total Protein 6.5 - 8.1 g/dL 6.8   Total Bilirubin 0.3 - 1.2 mg/dL 0.3   Alkaline Phos 38 - 126 U/L 93   AST 15 - 41 U/L 14   ALT 0 - 44 U/L 14    Edinburgh Score:     No data to display           After visit meds:  Allergies as of 07/06/2022       Reactions   Nitrous Oxide Other (See Comments)   "siezures," per pt     Med Rec must be completed prior to [facility]*        Discharge  home in stable condition Infant Feeding: {Baby feeding:23562} Infant Disposition:{CHL IP OB HOME WITH IHNFQP:55397} Discharge instruction: per After Visit Summary and Postpartum booklet. Activity: Advance as tolerated. Pelvic rest for 6 weeks.  Diet: routine diet Future Appointments: Future Appointments  Date Time Provider [place]  07/10/2022 10:55 AM Clarnce Flock, MD [facility]  07/13/2022 12:00 AM [facility]-LD SCHED ROOM [facility]INDC None  07/15/2022 11:15 AM [facility]  07/16/2022 10:15 AM Clarnce Flock, MD [facility]  08/09/2022  2:00 PM Berniece Salines, DO [facility] None   Follow up Visit:  Myrtis Ser, CNM  P [facility] Admin This patient is a Mom+Baby Combined care infant. The patient and baby need the following appointments scheduled:   Infant needs a newborn weight check -- 2-3 days from discharge. Approximate date of d/c:  07/07/22  Infant needs a 2 week weight check  Infant needs 1 month Well Child Check   Thank you!   Please schedule this patient for Postpartum visit in: 6 weeks with the following provider: Any provider  In-Person  For C/S patients schedule nurse incision check in weeks 2 weeks: no  High risk pregnancy complicated by: GDMA1  Delivery mode:  SVD  Anticipated Birth Control:  Depo (will offer inpatient)  PP Procedures needed: 1st Pap  Edinburgh: unsure Schedule Integrated BH visit: no  No relevant baby issues   07/06/2022 Myrtis Ser, CNM

## 2022-07-05 NOTE — Anesthesia Procedure Notes (Signed)
Epidural Patient location during procedure: OB Start time: 07/05/2022 4:14 PM End time: 07/05/2022 4:19 PM  Staffing Anesthesiologist: Suzette Battiest, MD Performed: anesthesiologist   Preanesthetic Checklist Completed: patient identified, IV checked, site marked, risks and benefits discussed, surgical consent, monitors and equipment checked, pre-op evaluation and timeout performed  Epidural Patient position: sitting Prep: DuraPrep and site prepped and draped Patient monitoring: continuous pulse ox and blood pressure Approach: midline Location: L4-L5 Injection technique: LOR air  Needle:  Needle type: Tuohy  Needle gauge: 17 G Needle length: 9 cm and 9 Needle insertion depth: 5 cm cm Catheter type: closed end flexible Catheter size: 19 Gauge Catheter at skin depth: 10 cm Test dose: negative  Assessment Events: blood not aspirated, injection not painful, no injection resistance, no paresthesia and negative IV test

## 2022-07-05 NOTE — Progress Notes (Signed)
Patient ID: Janet Horn, female   DOB: Oct 16, 2000, 21 y.o.   MRN: [id number]   Feeling a lot of pressure; shaking; epidural working well for pain  BP 122/65 FHR 130s, +accels, no decels Ctx q 3 mins Cx C/C/vtx +2; SROM w exam for thick MSF  IUP@38 .6wks End 1st stage GDMA1  Begin pushing w ctx Anticipate vag del Plan to have peds team present  Myrtis Ser CNM 07/05/2022 9:22 PM
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
## 2022-07-06 ENCOUNTER — Encounter (HOSPITAL_COMMUNITY): Payer: Self-pay | Admitting: Family Medicine

## 2022-07-06 LAB — RPR: RPR Ser Ql: NONREACTIVE

## 2022-07-06 MED ORDER — SENNOSIDES-DOCUSATE SODIUM 8.6-50 MG PO TABS: 2.0000 | ORAL_TABLET | ORAL | Status: DC

## 2022-07-06 MED ORDER — DIPHENHYDRAMINE HCL 25 MG PO CAPS: 25.0000 mg | ORAL_CAPSULE | Freq: Four times a day (QID) | ORAL | Status: DC | PRN

## 2022-07-06 MED ORDER — WITCH HAZEL-GLYCERIN EX PADS: 1.0000 | MEDICATED_PAD | CUTANEOUS | Status: DC | PRN

## 2022-07-06 MED ORDER — TETANUS-DIPHTH-ACELL PERTUSSIS 5-2.5-18.5 LF-MCG/0.5 IM SUSY: 0.5000 mL | PREFILLED_SYRINGE | Freq: Once | INTRAMUSCULAR | Status: DC

## 2022-07-06 MED ORDER — MEDROXYPROGESTERONE ACETATE 150 MG/ML IM SUSP
150.0000 mg | Freq: Once | INTRAMUSCULAR | Status: AC
  Administered 2022-07-07: 150 mg via INTRAMUSCULAR
  Filled 2022-07-06: qty 1

## 2022-07-06 MED ORDER — OXYCODONE HCL 5 MG PO TABS: 5.0000 mg | ORAL_TABLET | ORAL | Status: DC | PRN

## 2022-07-06 MED ORDER — DIBUCAINE (PERIANAL) 1 % EX OINT: 1.0000 | TOPICAL_OINTMENT | CUTANEOUS | Status: DC | PRN

## 2022-07-06 MED ORDER — SIMETHICONE 80 MG PO CHEW: 80.0000 mg | CHEWABLE_TABLET | ORAL | Status: DC | PRN

## 2022-07-06 MED ORDER — ZOLPIDEM TARTRATE 5 MG PO TABS: 5.0000 mg | ORAL_TABLET | Freq: Every evening | ORAL | Status: DC | PRN

## 2022-07-06 MED ORDER — COCONUT OIL OIL: 1.0000 | TOPICAL_OIL | Status: DC | PRN

## 2022-07-06 MED ORDER — MEASLES, MUMPS & RUBELLA VAC IJ SOLR: 0.5000 mL | Freq: Once | INTRAMUSCULAR | Status: DC

## 2022-07-06 MED ORDER — PRENATAL MULTIVITAMIN CH
1.0000 | ORAL_TABLET | Freq: Every day | ORAL | Status: DC
  Administered 2022-07-06 – 2022-07-07 (×2): 1 via ORAL
  Filled 2022-07-06 (×2): qty 1

## 2022-07-06 MED ORDER — IBUPROFEN 600 MG PO TABS
600.0000 mg | ORAL_TABLET | Freq: Four times a day (QID) | ORAL | Status: DC
  Administered 2022-07-06 – 2022-07-07 (×3): 600 mg via ORAL
  Filled 2022-07-06 (×6): qty 1

## 2022-07-06 MED ORDER — ONDANSETRON HCL 4 MG PO TABS: 4.0000 mg | ORAL_TABLET | ORAL | Status: DC | PRN

## 2022-07-06 MED ORDER — BENZOCAINE-MENTHOL 20-0.5 % EX AERO
1.0000 | INHALATION_SPRAY | CUTANEOUS | Status: DC | PRN
  Filled 2022-07-06: qty 56

## 2022-07-06 MED ORDER — ONDANSETRON HCL 4 MG/2ML IJ SOLN: 4.0000 mg | INTRAMUSCULAR | Status: DC | PRN

## 2022-07-06 MED ORDER — ACETAMINOPHEN 325 MG PO TABS: 650.0000 mg | ORAL_TABLET | ORAL | Status: DC | PRN

## 2022-07-06 NOTE — Plan of Care (Signed)
  Problem: Education: Goal: Knowledge of condition will improve Outcome: Completed/Met

## 2022-07-06 NOTE — Progress Notes (Signed)
Post Partum Day #1 Subjective: no complaints, up ad lib, and tolerating PO; breast and bottlefeeding; Depo prior to d/c; she desires a circumcision for her son  Objective: Blood pressure (!) 111/59, pulse 77, temperature 98.7 F (37.1 C), temperature source Oral, resp. rate 18, last menstrual period 10/02/2021, SpO2 100 %, unknown if currently breastfeeding.  Physical Exam:  General: alert, cooperative, and no distress Lochia: appropriate Uterine Fundus: firm DVT Evaluation: No evidence of DVT seen on physical exam.  Recent Labs    07/05/22 1552  HGB 12.3  HCT 38.8    Assessment/Plan: Plan for discharge tomorrow and Circumcision prior to discharge (consented and note on baby's chart) Fasting CBG not collected this AM- will order for tomorrow   LOS: 1 day   Myrtis Ser, CNM 07/06/2022, 8:56 AM
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
## 2022-07-06 NOTE — Lactation Note (Signed)
This note was copied from a baby's chart. Lactation Consultation Note  Patient Name: Janet Horn [id number] Date: 07/06/2022   Age:21 hours  Baby was 36 hrs old when [facility] consulted with parents.  Infant is an ET baby with bruising noted on caput.  Baby has had 3 bottle supplements after a breastfeeding attempt after birth.  Baby swaddled in crib.   LC recommended removing swaddle and placing baby STS.  [facility] assisted with this.  Reviewed hand expression.  Baby tired. Reassured Mom and encouraged calling for latch assistance.    Broadus John 07/06/2022, 3:51 PM

## 2022-07-06 NOTE — Anesthesia Postprocedure Evaluation (Signed)
Anesthesia Post Note  Patient: Janet Horn  Procedure(s) Performed: AN AD [place]     Patient location during evaluation: Mother Baby Anesthesia Type: Epidural Level of consciousness: awake and alert and oriented Pain management: satisfactory to patient Vital Signs Assessment: post-procedure vital signs reviewed and stable Respiratory status: respiratory function stable Cardiovascular status: stable Postop Assessment: no headache, no backache, epidural receding, patient able to bend at knees, no signs of nausea or vomiting, adequate PO intake and able to ambulate Anesthetic complications: no   No notable events documented.  Last Vitals:  Vitals:   07/06/22 0930 07/06/22 1301  BP: (!) 110/59 (!) 101/59  Pulse: 76 69  Resp: 18 18  Temp: 36.8 C (!) 36.4 C  SpO2: 99% 100%    Last Pain:  Vitals:   07/06/22 1529  TempSrc:   PainSc: 0-No pain   Pain Goal:                   Janet Horn

## 2022-07-07 ENCOUNTER — Other Ambulatory Visit: Payer: Self-pay

## 2022-07-07 NOTE — Lactation Note (Signed)
This note was copied from a baby's chart. Lactation Consultation Note  Patient Name: [name] [id number] Date: 07/07/2022 Reason for consult: Follow-up assessment Age:21 hours   P1: Early term infant at 38+6 weeks Feeding preference: Breast/formula Weight loss: 2%  Birth parent stated that she "really wants to breastfeed" , however, has been providing mostly formula.  She is having difficulty  latching.  Reviewed breast feeding basics with her and offered to return at the next feeding for assistance.  Baby will be receiving a circumcision and discussed the possibility of sleepiness after the circumcision.  Encouraged formula volumes of 30+ mls if he does not breast feed.  Birth parent verbalized understanding.  She does not have a pump for home use.  Stork pump paperwork scanned and faxed.  RN will follow up.    Maternal Data    Feeding    LATCH Score                    Lactation Tools Discussed/Used    Interventions    Discharge Discharge Education: Engorgement and breast care Pump: Stork Pump (Paperwork sent at 1044)  Consult Status Consult Status: Complete Date: 07/07/22 Follow-up type: Call as needed    Beya Tipps R Ivory Bail 07/07/2022, 10:44 AM

## 2022-07-07 NOTE — Progress Notes (Signed)
RN called LC to follow up on status of stork pump.

## 2022-07-08 ENCOUNTER — Ambulatory Visit: Payer: Self-pay

## 2022-07-08 NOTE — Lactation Note (Signed)
This note was copied from a baby's chart. Lactation Consultation Note  Patient Name: Janet Horn [id number] Date: 07/08/2022 Reason for consult: Follow-up assessment Age:21 hours  P1, Offered to assist with breastfeeding and mother declined.  Discussed pumping q 3 hours with new stork pump.  Reviewed engorgement care and monitoring voids/stools. Mother will call if she needs further assistance.   Maternal Data Has patient been taught Hand Expression?: Yes Does the patient have breastfeeding experience prior to this delivery?: No  Feeding Mother's Current Feeding Choice: Breast Milk and Formula   Lactation Tools Discussed/Used Tools: Pump  Interventions Interventions: Education;DEBP  Discharge Discharge Education: Engorgement and breast care;Warning signs for feeding baby Pump: Stork Pump (Arrived)  Consult Status Consult Status: Complete Date: 07/08/22    [name] [facility] 07/08/2022, 12:08 PM

## 2022-07-09 ENCOUNTER — Encounter: Payer: Self-pay | Admitting: Family Medicine

## 2022-07-10 ENCOUNTER — Encounter: Payer: Medicaid Other | Admitting: Family Medicine

## 2022-07-12 ENCOUNTER — Encounter: Payer: Self-pay | Admitting: Family Medicine

## 2022-07-13 ENCOUNTER — Inpatient Hospital Stay (HOSPITAL_COMMUNITY): Admission: AD | Admit: 2022-07-13 | Payer: Medicaid Other | Source: Home / Self Care | Admitting: Family Medicine

## 2022-07-13 ENCOUNTER — Inpatient Hospital Stay (HOSPITAL_COMMUNITY): Payer: Medicaid Other

## 2022-07-13 ENCOUNTER — Telehealth (HOSPITAL_COMMUNITY): Payer: Self-pay

## 2022-07-13 NOTE — Telephone Encounter (Signed)
Patient reports feeling good. Patient declines questions/concerns about her health and healing.  Patient reports that baby is doing well. "He is eating well. I am pumping and bottle feeding. I'm still supplementing with similac also." Patient has concerns that baby has not had a stool diaper in 3 days. Patient states that baby is eating well, abdomen is soft, he is passing gas, and no vomiting. RN told patient that if she is concerned to contact her pediatrician. Baby sleeps in a bassinet. RN reviewed ABC's of safe sleep with patient. Patient declines any questions or concerns about baby.  EPDS score is 4.  Sharyn Lull [facility]  07/13/22,0908

## 2022-07-15 ENCOUNTER — Other Ambulatory Visit: Payer: Medicaid Other

## 2022-07-16 ENCOUNTER — Encounter: Payer: Medicaid Other | Admitting: Family Medicine

## 2022-07-23 ENCOUNTER — Other Ambulatory Visit (HOSPITAL_COMMUNITY): Payer: Self-pay

## 2022-07-24 ENCOUNTER — Other Ambulatory Visit (HOSPITAL_COMMUNITY): Payer: Self-pay

## 2022-07-24 MED ORDER — SERTRALINE HCL 50 MG PO TABS
50.0000 mg | ORAL_TABLET | ORAL | 0 refills | Status: DC
  Filled 2022-07-24 – 2022-07-26 (×2): qty 30, 30d supply, fill #0

## 2022-07-25 ENCOUNTER — Other Ambulatory Visit (HOSPITAL_COMMUNITY): Payer: Self-pay

## 2022-07-26 ENCOUNTER — Other Ambulatory Visit (HOSPITAL_COMMUNITY): Payer: Self-pay

## 2022-08-05 NOTE — Progress Notes (Signed)
Post Partum Visit Note  Janet Horn is a 21 y.o. G27P1011 female who presents for a postpartum visit. She is 4 weeks postpartum following a normal spontaneous vaginal delivery.  I have fully reviewed the prenatal and intrapartum course. The delivery was at [redacted]w[redacted]d gestational weeks.  Anesthesia: epidural. Postpartum course has been uneventful. Baby is doing well. Baby is feeding by both breast and bottle - Similac Sensitive RS. Bleeding staining only. Bowel function is  constipation, states she has not been having regular BM's . Bladder function is normal. Patient is not sexually active. Contraception method is Depo-Provera injections. Postpartum depression screening: negative.   The pregnancy intention screening data noted above was reviewed. Potential methods of contraception were discussed. The patient elected to proceed with No data recorded.   Edinburgh Postnatal Depression Scale - 08/06/22 1415       Edinburgh Postnatal Depression Scale:  In the Past 7 Days   I have been able to laugh and see the funny side of things. 0    I have looked forward with enjoyment to things. 0    I have blamed myself unnecessarily when things went wrong. 2    I have been anxious or worried for no good reason. 2    I have felt scared or panicky for no good reason. 2    Things have been getting on top of me. 2    I have been so unhappy that I have had difficulty sleeping. 0    I have felt sad or miserable. 0    I have been so unhappy that I have been crying. 1    The thought of harming myself has occurred to me. 0    Edinburgh Postnatal Depression Scale Total 9             Health Maintenance Due  Topic Date Due   COVID-19 Vaccine (1) Never done   HPV VACCINES (1 - 2-dose series) Never done   PAP-Cervical Cytology Screening  07/10/2022   PAP SMEAR-Modifier  07/10/2022    The following portions of the patient's history were reviewed and updated as appropriate: allergies, current  medications, past family history, past medical history, past social history, past surgical history, and problem list.  Review of Systems Pertinent items noted in HPI and remainder of comprehensive ROS otherwise negative.  Objective:  BP 124/81   Pulse (!) 102   Wt 167 lb 8 oz (76 kg)   BMI 30.64 kg/m    General:  alert, cooperative, and appears stated age   Breasts:  not indicated  Lungs: Comfortalbe on room air  Wound N/a  GU exam:  normal         Assessment:    There are no diagnoses linked to this encounter.  Normal postpartum exam.   Plan:   Essential components of care per ACOG recommendations:  1.  Mood and well being: Patient with negative depression screening today. Reviewed local resources for support. Going to therapy every Thursday, Zoloft dose increased recently.  - Patient tobacco use? No.   - hx of drug use? No.    2. Infant care and feeding:  -Patient currently breastmilk feeding? No.  -Social determinants of health (SDOH) reviewed in EPIC. The following needs were identified: food, transportation, financial, connected with patient navigators  3. Sexuality, contraception and birth spacing - Patient does not want a pregnancy in the next year.  Desired family size is unsure number children.  - Reviewed reproductive life  planning. Reviewed contraceptive methods based on pt preferences and effectiveness.  Patient desired Hormonal Implant today.  Last received 07/06/2022 in hospital, return visit scheduled.  - Discussed birth spacing of 18 months  4. Sleep and fatigue -Encouraged family/partner/community support of 4 hrs of uninterrupted sleep to help with mood and fatigue  5. Physical Recovery  - Discussed patients delivery and complications. She describes her labor as good. - Patient had a Vaginal, no problems at delivery. Patient had a 1st degree laceration. Perineal healing reviewed. Patient expressed understanding - Patient has urinary incontinence?  No. - Patient is safe to resume physical and sexual activity  6.  Health Maintenance - HM due items addressed Yes - Last pap smear No results found for: "DIAGPAP" Pap smear done at today's visit.  -Breast Cancer screening indicated? No.   7. Chronic Disease/Pregnancy Condition follow up: Gestational Diabetes - GDM in pregnancy, will get A1c at baby's 4 months visit - PCP follow up  Clarnce Flock, [place] [facility] [place], [place]

## 2022-08-06 ENCOUNTER — Ambulatory Visit (INDEPENDENT_AMBULATORY_CARE_PROVIDER_SITE_OTHER): Payer: Medicaid Other | Admitting: Family Medicine

## 2022-08-06 ENCOUNTER — Other Ambulatory Visit (HOSPITAL_COMMUNITY)
Admission: RE | Admit: 2022-08-06 | Discharge: 2022-08-06 | Disposition: A | Discharge status: Home / Self Care | Payer: Medicaid Other | Source: Ambulatory Visit | Attending: Family Medicine | Admitting: Family Medicine

## 2022-08-06 DIAGNOSIS — Z124 Encounter for screening for malignant neoplasm of cervix: Secondary | ICD-10-CM

## 2022-08-09 ENCOUNTER — Ambulatory Visit: Payer: Medicaid Other | Admitting: Cardiology

## 2022-08-14 LAB — CYTOLOGY - PAP
Diagnosis: NEGATIVE
Diagnosis: REACTIVE

## 2022-08-19 ENCOUNTER — Encounter: Payer: Self-pay | Admitting: Family Medicine

## 2022-08-19 ENCOUNTER — Telehealth: Payer: Self-pay | Admitting: Family Medicine

## 2022-08-19 NOTE — Telephone Encounter (Signed)
Patient said she has been bleeding a lot and also having headaches, want to know if this is a concern

## 2022-08-20 ENCOUNTER — Other Ambulatory Visit (HOSPITAL_COMMUNITY): Payer: Self-pay

## 2022-08-20 ENCOUNTER — Telehealth (INDEPENDENT_AMBULATORY_CARE_PROVIDER_SITE_OTHER): Payer: Medicaid Other | Admitting: Family Medicine

## 2022-08-20 ENCOUNTER — Encounter: Payer: Self-pay | Admitting: Family Medicine

## 2022-08-20 DIAGNOSIS — O0990 Supervision of high risk pregnancy, unspecified, unspecified trimester: Secondary | ICD-10-CM

## 2022-08-20 DIAGNOSIS — M549 Dorsalgia, unspecified: Secondary | ICD-10-CM | POA: Diagnosis not present

## 2022-08-20 DIAGNOSIS — O99891 Other specified diseases and conditions complicating pregnancy: Secondary | ICD-10-CM | POA: Diagnosis not present

## 2022-08-20 DIAGNOSIS — G44229 Chronic tension-type headache, not intractable: Secondary | ICD-10-CM

## 2022-08-20 MED ORDER — MAGNESIUM 400 MG PO CAPS
1.0000 | ORAL_CAPSULE | Freq: Every day | ORAL | 3 refills | Status: DC
  Filled 2022-08-20: qty 90, fill #0

## 2022-08-20 MED ORDER — CYCLOBENZAPRINE HCL 10 MG PO TABS
10.0000 mg | ORAL_TABLET | Freq: Three times a day (TID) | ORAL | 1 refills | Status: DC | PRN
  Filled 2022-08-20: qty 30, 10d supply, fill #0
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
## 2022-08-22 ENCOUNTER — Other Ambulatory Visit (HOSPITAL_COMMUNITY): Payer: Self-pay

## 2022-08-22 ENCOUNTER — Encounter (HOSPITAL_COMMUNITY): Payer: Self-pay

## 2022-08-28 ENCOUNTER — Other Ambulatory Visit (HOSPITAL_COMMUNITY): Payer: Self-pay

## 2022-08-28 MED ORDER — SERTRALINE HCL 50 MG PO TABS
50.0000 mg | ORAL_TABLET | Freq: Every morning | ORAL | 0 refills | Status: DC
  Filled 2022-08-28 – 2022-11-15 (×3): qty 30, 30d supply, fill #0

## 2022-08-29 ENCOUNTER — Other Ambulatory Visit (HOSPITAL_COMMUNITY): Payer: Self-pay

## 2022-08-29 ENCOUNTER — Encounter (HOSPITAL_COMMUNITY): Payer: Self-pay

## 2022-09-02 ENCOUNTER — Other Ambulatory Visit (HOSPITAL_COMMUNITY): Payer: Self-pay

## 2022-09-05 ENCOUNTER — Encounter: Payer: Self-pay | Admitting: Family Medicine

## 2022-09-06 ENCOUNTER — Other Ambulatory Visit (HOSPITAL_COMMUNITY): Payer: Self-pay

## 2022-09-25 ENCOUNTER — Ambulatory Visit (INDEPENDENT_AMBULATORY_CARE_PROVIDER_SITE_OTHER): Payer: Medicaid Other

## 2022-09-25 DIAGNOSIS — Z3042 Encounter for surveillance of injectable contraceptive: Secondary | ICD-10-CM

## 2022-09-25 MED ORDER — MEDROXYPROGESTERONE ACETATE 150 MG/ML IM SUSP
150.0000 mg | Freq: Once | INTRAMUSCULAR | Status: AC
  Administered 2022-09-25: 150 mg via INTRAMUSCULAR

## 2022-09-25 NOTE — Progress Notes (Signed)
Janet Horn here for Depo-Provera Injection. Injection administered without complication. Patient will return in 3 months for next injection between 02/28 and 03/14. Next annual visit due October 2024.   [name], RN 09/25/2022  11:05 AM

## 2022-09-26 ENCOUNTER — Ambulatory Visit: Payer: Medicaid Other

## 2022-10-09 ENCOUNTER — Other Ambulatory Visit (HOSPITAL_COMMUNITY): Payer: Self-pay

## 2022-10-09 ENCOUNTER — Other Ambulatory Visit: Payer: Self-pay

## 2022-10-12 ENCOUNTER — Other Ambulatory Visit (HOSPITAL_COMMUNITY): Payer: Self-pay

## 2022-10-24 ENCOUNTER — Other Ambulatory Visit: Payer: Self-pay

## 2022-11-15 ENCOUNTER — Other Ambulatory Visit (HOSPITAL_COMMUNITY): Payer: Self-pay

## 2022-11-20 ENCOUNTER — Other Ambulatory Visit (HOSPITAL_COMMUNITY): Payer: Self-pay

## 2022-11-27 LAB — GLUCOSE, POCT (MANUAL RESULT ENTRY): POC Glucose: 126 mg/dl — AB (ref 70–99)

## 2022-11-27 NOTE — Progress Notes (Signed)
Pt has had food today.

## 2022-11-29 ENCOUNTER — Encounter (HOSPITAL_COMMUNITY): Payer: Self-pay | Admitting: Psychiatry

## 2022-11-29 ENCOUNTER — Other Ambulatory Visit: Payer: Self-pay

## 2022-11-29 ENCOUNTER — Ambulatory Visit (INDEPENDENT_AMBULATORY_CARE_PROVIDER_SITE_OTHER): Payer: No Typology Code available for payment source | Admitting: Psychiatry

## 2022-11-29 ENCOUNTER — Other Ambulatory Visit (HOSPITAL_COMMUNITY): Payer: Self-pay

## 2022-11-29 DIAGNOSIS — F411 Generalized anxiety disorder: Secondary | ICD-10-CM

## 2022-11-29 DIAGNOSIS — F4329 Adjustment disorder with other symptoms: Secondary | ICD-10-CM | POA: Diagnosis not present

## 2022-11-29 MED ORDER — SERTRALINE HCL 100 MG PO TABS
100.0000 mg | ORAL_TABLET | Freq: Every morning | ORAL | 3 refills | Status: DC
  Filled 2022-11-29: qty 30, 30d supply, fill #0

## 2022-11-29 MED ORDER — HYDROXYZINE PAMOATE 25 MG PO CAPS
25.0000 mg | ORAL_CAPSULE | Freq: Three times a day (TID) | ORAL | 3 refills | Status: DC | PRN
  Filled 2022-11-29: qty 30, 10d supply, fill #0
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
## 2022-12-03 ENCOUNTER — Emergency Department (HOSPITAL_COMMUNITY): Payer: Medicaid Other

## 2022-12-03 ENCOUNTER — Encounter (HOSPITAL_COMMUNITY): Payer: Self-pay

## 2022-12-03 ENCOUNTER — Emergency Department (HOSPITAL_COMMUNITY)
Admission: EM | Admit: 2022-12-03 | Discharge: 2022-12-03 | Disposition: A | Discharge status: Home / Self Care | Payer: Medicaid Other | Attending: Emergency Medicine | Admitting: Emergency Medicine

## 2022-12-03 DIAGNOSIS — R519 Headache, unspecified: Secondary | ICD-10-CM

## 2022-12-03 DIAGNOSIS — R0789 Other chest pain: Secondary | ICD-10-CM

## 2022-12-03 LAB — CBC
HCT: 42.3 % (ref 36.0–46.0)
Hemoglobin: 13.3 g/dL (ref 12.0–15.0)
MCH: 22.7 pg — ABNORMAL LOW (ref 26.0–34.0)
MCHC: 31.4 g/dL (ref 30.0–36.0)
MCV: 72.2 fL — ABNORMAL LOW (ref 80.0–100.0)
Platelets: 350 10*3/uL (ref 150–400)
RBC: 5.86 MIL/uL — ABNORMAL HIGH (ref 3.87–5.11)
RDW: 14.8 % (ref 11.5–15.5)
WBC: 6.1 10*3/uL (ref 4.0–10.5)
nRBC: 0 % (ref 0.0–0.2)

## 2022-12-03 LAB — BASIC METABOLIC PANEL
Anion gap: 11 (ref 5–15)
BUN: 8 mg/dL (ref 6–20)
CO2: 25 mmol/L (ref 22–32)
Calcium: 9.2 mg/dL (ref 8.9–10.3)
Chloride: 104 mmol/L (ref 98–111)
Creatinine, Ser: 0.57 mg/dL (ref 0.44–1.00)
GFR, Estimated: >60 mL/min (ref >60)
Glucose, Bld: 93 mg/dL (ref 70–99)
Potassium: 3.4 mmol/L — ABNORMAL LOW (ref 3.5–5.1)
Sodium: 140 mmol/L (ref 135–145)

## 2022-12-03 LAB — I-STAT BETA HCG BLOOD, ED (MC, WL, AP ONLY): I-stat hCG, quantitative: <5 m[IU]/mL (ref <5)

## 2022-12-03 LAB — TROPONIN I (HIGH SENSITIVITY): Troponin I (High Sensitivity): <2 ng/L (ref <18)

## 2022-12-03 MED ORDER — DIPHENHYDRAMINE HCL 50 MG/ML IJ SOLN
12.5000 mg | Freq: Once | INTRAMUSCULAR | Status: AC
  Administered 2022-12-03: 12.5 mg via INTRAVENOUS
  Filled 2022-12-03: qty 1

## 2022-12-03 MED ORDER — SODIUM CHLORIDE 0.9 % IV BOLUS
1000.0000 mL | Freq: Once | INTRAVENOUS | Status: AC
  Administered 2022-12-03: 1000 mL via INTRAVENOUS

## 2022-12-03 MED ORDER — PROCHLORPERAZINE EDISYLATE 10 MG/2ML IJ SOLN
10.0000 mg | Freq: Once | INTRAMUSCULAR | Status: AC
  Administered 2022-12-03: 10 mg via INTRAVENOUS
  Filled 2022-12-03: qty 2

## 2022-12-03 NOTE — Discharge Instructions (Signed)
You left AGAINST MEDICAL ADVICE prior to further workup of your chest pain and headache.  I am unable to rule out any abnormalities in your brain without your CT scan.  Please return if symptoms worsen.

## 2022-12-03 NOTE — ED Provider Notes (Signed)
[facility] Provider Note   CSN: [id number] Arrival date & time: 12/03/22  1259     History  Chief Complaint  Patient presents with   Headache   Chest Pain    Janet Horn is a 22 y.o. female with a past medical history significant for depression, PTSD, and adjustment disorder who presents to the ED due to sudden onset of bilateral headache associated with blurry vision that started prior to arrival.  Patient states she developed a bilateral headache and blurry vision and then began to feel cold and lightheaded.  She then developed chest pain.  Patient notes she began hyperventilating.  History of anxiety.  No history of blood clots, recent surgeries, recent long immobilizations, or hormonal treatments.  No lower extremity edema.  Patient states she has a history of thalassemia and gets frequent headaches however, notes this headache felt different.  No recent head injury.  Patient notes her headache and blurry vision have improved however, have not resolved.  No speech changes.  Denies unilateral weakness.  Patient believes her symptoms are related to not eating today.  History obtained from patient and past medical records. No interpreter used during encounter.       Home Medications Prior to Admission medications   Medication Sig Start Date End Date Taking? Authorizing Provider  Accu-Chek Softclix Lancets lancets Use four times daily as instructed. 04/25/22   Caren Macadam, MD  acetaminophen (TYLENOL) 325 MG tablet Take 2 tablets (650 mg total) by mouth every 6 (six) hours as needed. 01/22/22   Clarnce Flock, MD  Blood Glucose Monitoring Suppl (ACCU-CHEK GUIDE) w/Device KIT 1 Device by Does not apply route in the morning, at noon, in the evening, and at bedtime. Patient not taking: Reported on 08/20/2022 04/25/22   Caren Macadam, MD  Blood Pressure Monitoring (BLOOD PRESSURE KIT) DEVI 1 Device by Does not  apply route once a week. Patient not taking: Reported on 08/20/2022 12/19/21   Truett Mainland, DO  cyclobenzaprine (FLEXERIL) 10 MG tablet Take 1 tablet (10 mg total) by mouth every 8 (eight) hours as needed for muscle spasms. 08/20/22   Clarnce Flock, MD  ferrous sulfate (FERROUSUL) 325 (65 FE) MG tablet Take 1 tablet (325 mg total) by mouth every other day. 04/15/22   Caren Macadam, MD  glucose blood (ACCU-CHEK GUIDE) test strip Use as instructed Patient not taking: Reported on 08/20/2022 04/25/22   Caren Macadam, MD  hydrOXYzine (VISTARIL) 25 MG capsule Take 1 capsule (25 mg total) by mouth 3 (three) times daily as needed. 11/29/22   Salley Slaughter, NP  Magnesium 400 MG CAPS Take 1 capsule by mouth daily. 08/20/22   Clarnce Flock, MD  Prenatal Vit-Fe Fumarate-FA (MULTIVITAMIN-PRENATAL) 27-0.8 MG TABS tablet Take 1 tablet by mouth daily at 12 noon.    [provider]  sertraline (ZOLOFT) 100 MG tablet Take 1 tablet (100 mg total) by mouth in the morning. 11/29/22   Salley Slaughter, NP      Allergies    Nitrous oxide    Review of Systems   Review of Systems  Constitutional:  Negative for chills and fever.  Eyes:  Positive for visual disturbance.  Respiratory:  Negative for shortness of breath.   Cardiovascular:  Positive for chest pain. Negative for leg swelling.  Gastrointestinal:  Negative for abdominal pain.  Neurological:  Positive for light-headedness and headaches. Negative for seizures and weakness.  All other systems reviewed and are negative.   Physical Exam Updated Vital Signs BP 130/64   Pulse (!) 50   Temp 98.3 F (36.8 C) (Oral)   Resp 20   Ht 5' 2"$  (1.575 m)   Wt 63.5 kg   SpO2 100%   Breastfeeding No   BMI 25.61 kg/m  Physical Exam Vitals and nursing note reviewed.  Constitutional:      General: She is not in acute distress.    Appearance: She is not ill-appearing.  HENT:     Head: Normocephalic.  Eyes:     Pupils:  Pupils are equal, round, and reactive to light.  Cardiovascular:     Rate and Rhythm: Normal rate and regular rhythm.     Pulses: Normal pulses.     Heart sounds: Normal heart sounds. No murmur heard.    No friction rub. No gallop.  Pulmonary:     Effort: Pulmonary effort is normal.     Breath sounds: Normal breath sounds.  Chest:     Comments: Reproducible anterior chest wall tenderness without crepitus or deformity. Abdominal:     General: Abdomen is flat. There is no distension.     Palpations: Abdomen is soft.     Tenderness: There is no abdominal tenderness. There is no guarding or rebound.  Musculoskeletal:        General: Normal range of motion.     Cervical back: Neck supple.  Skin:    General: Skin is warm and dry.  Neurological:     General: No focal deficit present.     Mental Status: She is alert.     Comments: Speech is clear, able to follow commands CN III-XII intact Normal strength in upper and lower extremities bilaterally including dorsiflexion and plantar flexion, strong and equal grip strength Sensation grossly intact throughout Moves extremities without ataxia, coordination intact No pronator drift Ambulates without difficulty  Psychiatric:        Mood and Affect: Mood normal.        Behavior: Behavior normal.     ED Results / Procedures / Treatments   Labs (all labs ordered are listed, but only abnormal results are displayed) Labs Reviewed  BASIC METABOLIC PANEL - Abnormal; Notable for the following components:      Result Value   Potassium 3.4 (*)    All other components within normal limits  CBC - Abnormal; Notable for the following components:   RBC 5.86 (*)    MCV 72.2 (*)    MCH 22.7 (*)    All other components within normal limits  I-STAT BETA HCG BLOOD, ED ([facility], [facility], [facility] ONLY)  TROPONIN I (HIGH SENSITIVITY)  TROPONIN I (HIGH SENSITIVITY)    EKG EKG Interpretation  Date/Time:  Tuesday December 03 2022 13:22:33 EST Ventricular Rate:   82 PR Interval:  156 QRS Duration: 81 QT Interval:  358 QTC Calculation: 419 R Axis:   -6 Text Interpretation: Sinus rhythm Low voltage, precordial leads No significant change since last tracing Confirmed by Lacretia Leigh ([id number]) on 12/03/2022 2:39:40 PM  Radiology DG Chest 2 View  Result Date: 12/03/2022 CLINICAL DATA:  chest pain EXAM: CHEST - 2 VIEW COMPARISON:  03/21/2022 FINDINGS: Cardiac silhouette is unremarkable. No pneumothorax or pleural effusion. The lungs are clear. The visualized skeletal structures are unremarkable. IMPRESSION: No acute cardiopulmonary process. Electronically Signed   By: Sammie Bench M.D.   On: 12/03/2022 13:55    Procedures Procedures    Medications Ordered in  ED Medications  prochlorperazine (COMPAZINE) injection 10 mg (10 mg Intravenous Given 12/03/22 1503)  diphenhydrAMINE (BENADRYL) injection 12.5 mg (12.5 mg Intravenous Given 12/03/22 1503)  sodium chloride 0.9 % bolus 1,000 mL (1,000 mLs Intravenous New Bag/Given 12/03/22 1501)    ED Course/ Medical Decision Making/ A&P                             Medical Decision Making Amount and/or Complexity of Data Reviewed Independent Historian: EMS Labs: ordered. Decision-making details documented in ED Course. Radiology: ordered and independent interpretation performed. Decision-making details documented in ED Course. ECG/medicine tests: ordered and independent interpretation performed. Decision-making details documented in ED Course.  Risk Prescription drug management.   This patient presents to the ED for concern of headache, this involves an extensive number of treatment options, and is a complaint that carries with it a high risk of complications and morbidity.  The differential diagnosis includes intracranial bleed, migraine, dehydration, etc  22 year old female presents to the ED due to headache and blurry vision that started prior to arrival.  Patient states she has not eaten anything  today.  She also endorses chest pain and lightheadedness.  Admits to occasional headaches but never been diagnosed with migraines.  Upon arrival, vitals all within normal limits.  Patient in no acute distress.  Benign physical exam.  Normal neurological exam without any neurological deficits.  Low suspicion for CVA.  No meningismus to suggest meningitis.  Reproducible anterior chest wall tenderness without crepitus or deformity.  No lower extremity edema.  PERC negative and low risk using Wells criteria.  Low suspicion for PE/DVT. Labs ordered. CT head. IVFs and migraine cocktail given.  CBC with no leukocytosis.  Normal hemoglobin.  Troponin normal.  Pregnancy test negative.  BMP significant for hypokalemia at 3.4.  Normal renal function.  Chest x-ray personally reviewed and interpreted which negative for signs of pneumonia, pneumothorax or widened mediastinum.  EKG demonstrates normal sinus rhythm.  No signs of acute ischemia.  Low suspicion for ACS.  3:19 PM informed by RN that patient would like to leave.  Patient states she needs to get home to her child.  Heart rate on monitor in the 120s.  Patient states she feels anxious because she needs to leave right now and has no ride home.  Discussed with patient that I am unable to rule out any intracranial abnormalities without CT head and she would be leaving [place].  I also informed her I am unable to rule out other etiologies of chest pain. Patient states understanding and still notes she needs to leave to watch her son.  Advised patient to return if symptoms worsen.  No PCP Hx Depression, anxiety, PTSD       Final Clinical Impression(s) / ED Diagnoses Final diagnoses:  Acute nonintractable headache, unspecified headache type  Atypical chest pain    Rx / DC Orders ED Discharge Orders     None         Karie Kirks 12/03/22 1524    Lacretia Leigh, MD 12/04/22 [phone]

## 2022-12-03 NOTE — ED Notes (Signed)
Pt declined CT and requested to have fluids stop. Pt stated she had to get home to her son. Provider made aware.

## 2022-12-03 NOTE — ED Triage Notes (Signed)
Pt BIBA from work for sudden headache at bilateral temples and blurred vision. No hx of migraine or preeclampsia. C/o chest pain moving around, hx anxiety. Stroke screen negative.  123/84 HR 99 O2 98% CBG 111

## 2022-12-09 ENCOUNTER — Encounter: Payer: Self-pay | Admitting: *Deleted

## 2022-12-10 ENCOUNTER — Other Ambulatory Visit (HOSPITAL_COMMUNITY): Payer: Self-pay

## 2022-12-10 MED ORDER — IBUPROFEN 800 MG PO TABS
800.0000 mg | ORAL_TABLET | Freq: Three times a day (TID) | ORAL | 0 refills | Status: DC
  Filled 2022-12-10: qty 30, 10d supply, fill #0

## 2022-12-10 MED ORDER — CEPHALEXIN 500 MG PO CAPS
500.0000 mg | ORAL_CAPSULE | Freq: Two times a day (BID) | ORAL | 0 refills | Status: AC
  Filled 2022-12-10: qty 14, 7d supply, fill #0

## 2022-12-11 ENCOUNTER — Ambulatory Visit (INDEPENDENT_AMBULATORY_CARE_PROVIDER_SITE_OTHER): Payer: Medicaid Other

## 2022-12-11 ENCOUNTER — Other Ambulatory Visit: Payer: Self-pay

## 2022-12-11 DIAGNOSIS — Z3042 Encounter for surveillance of injectable contraceptive: Secondary | ICD-10-CM

## 2022-12-11 MED ORDER — MEDROXYPROGESTERONE ACETATE 150 MG/ML IM SUSY
150.0000 mg | PREFILLED_SYRINGE | Freq: Once | INTRAMUSCULAR | Status: AC
  Administered 2022-12-11: 150 mg via INTRAMUSCULAR

## 2022-12-11 NOTE — Progress Notes (Signed)
Janet Horn here for Depo-Provera Injection. Injection administered without complication. Patient will return in 3 months for next injection between 05/16 and 05/30. Patient states she would like to make appointment for Nexplanon insertion. States she has difficulty getting to the office every 3 months for Depo. Appointment made. Next annual visit due October 2024.   Martina Sinner, RN 12/11/2022  9:59 AM

## 2022-12-16 NOTE — Progress Notes (Signed)
Pt attended 2/14 24 screening event, at which time a PCP appt was made for her at [facility]. Pt's SDOH needs, documented over the last few months, show high risk for multiple barriers, including food and transportation, finance and social connections. Her OB/GYN physician office, Dr. Dione Plover, at [facility] for [place] has been told to connect to PCP for future care. Pt has Medicaid; therefore this caller communicated her SDOH barriers to the [facility] Medicaid Care Managers to help her connect to ongoing resources. Pt states she will call Dr. Marvell Fuller office to make a new pt appt to get established with him and requests this caller cancel her [facility] appt for 01/15/23. Pt denies any immediate healthcare or SDOH needs stating "I'm doing better now... and I will call Dr. Yong Channel to set up the appt." Letter sent to pt with all this info

## 2022-12-17 ENCOUNTER — Telehealth: Payer: Self-pay

## 2022-12-17 NOTE — Telephone Encounter (Signed)
.   Medicaid Managed Care   Unsuccessful Outreach Note  12/17/2022 Name: Janet Horn MRN: [id number] DOB: 06/13/2001  Referred by: Pcp, No Reason for referral : Appointment (I called the patient to get her scheduled with the [facility] Care Team. Call was dropped and I was unable to reach her when I called back. I left my name and number on her VM.)   An unsuccessful telephone outreach was attempted today. The patient was referred to the case management team for assistance with care management and care coordination.   Follow Up Plan: A HIPAA compliant phone message was left for the patient providing contact information and requesting a return call.  The care management team will reach out to the patient again over the next 7 days.   Secretary

## 2022-12-30 ENCOUNTER — Ambulatory Visit (INDEPENDENT_AMBULATORY_CARE_PROVIDER_SITE_OTHER): Payer: No Typology Code available for payment source | Admitting: Mental Health

## 2022-12-30 DIAGNOSIS — F333 Major depressive disorder, recurrent, severe with psychotic symptoms: Secondary | ICD-10-CM | POA: Diagnosis not present

## 2022-12-30 DIAGNOSIS — F411 Generalized anxiety disorder: Secondary | ICD-10-CM

## 2022-12-30 DIAGNOSIS — F4310 Post-traumatic stress disorder, unspecified: Secondary | ICD-10-CM
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
## 2023-01-03 ENCOUNTER — Encounter: Payer: Self-pay | Admitting: Family Medicine

## 2023-01-03 ENCOUNTER — Ambulatory Visit (INDEPENDENT_AMBULATORY_CARE_PROVIDER_SITE_OTHER): Payer: Medicaid Other | Admitting: Family Medicine

## 2023-01-03 ENCOUNTER — Other Ambulatory Visit: Payer: Self-pay

## 2023-01-03 VITALS — BP 126/80 | HR 80 | Wt 178.4 lb

## 2023-01-03 DIAGNOSIS — Z30017 Encounter for initial prescription of implantable subdermal contraceptive: Secondary | ICD-10-CM | POA: Diagnosis not present

## 2023-01-03 DIAGNOSIS — Z975 Presence of (intrauterine) contraceptive device: Secondary | ICD-10-CM | POA: Insufficient documentation

## 2023-01-03 MED ORDER — ETONOGESTREL 68 MG ~~LOC~~ IMPL: 68.0000 mg | DRUG_IMPLANT | Freq: Once | SUBCUTANEOUS | Status: DC

## 2023-01-03 NOTE — Progress Notes (Signed)
     GYNECOLOGY OFFICE PROCEDURE NOTE  Janet Horn is a 22 y.o. G2P1011 here for Nexplanon insertion.    Last pap smear was on     Component Value Date/Time   DIAGPAP  08/06/2022 1433    - Negative for Intraepithelial Lesions or Malignancy (NILM)   DIAGPAP - Benign reactive/reparative changes 08/06/2022 1433   ADEQPAP  08/06/2022 1433    Satisfactory but limited for evaluation with partially obscuring   ADEQPAP  08/06/2022 1433    inflammation; transformation zone component present.   No other gynecologic concerns.  Nexplanon insertion Procedure Patient identified, informed consent performed, consent signed.   Patient does understand that irregular bleeding is a very common side effect of this medication. She was advised to have backup contraception for one week after placement. Pregnancy test in clinic today was negative.  Appropriate time out taken.  Patient's left arm was prepped and draped in the usual sterile fashion. The ruler used to measure and mark insertion area.  Patient was prepped with alcohol swab and then injected with 3 ml of 1% lidocaine.  She was prepped with betadine, Nexplanon removed from packaging,  Device confirmed in needle, then inserted full length of needle and withdrawn per handbook instructions. Nexplanon was able to palpated in the patient's arm; patient palpated the insert herself. There was minimal blood loss.  Patient insertion site covered with guaze and a pressure bandage to reduce any bruising.  The patient tolerated the procedure well and was given post procedure instructions.  Clarnce Flock, MD/[facility], [facility] for [employer], [place]

## 2023-01-15 ENCOUNTER — Ambulatory Visit (INDEPENDENT_AMBULATORY_CARE_PROVIDER_SITE_OTHER): Payer: Medicaid Other | Admitting: Primary Care

## 2023-01-24 ENCOUNTER — Other Ambulatory Visit (HOSPITAL_COMMUNITY): Payer: Self-pay

## 2023-01-24 ENCOUNTER — Encounter (HOSPITAL_COMMUNITY): Payer: Self-pay | Admitting: Psychiatry

## 2023-01-24 ENCOUNTER — Other Ambulatory Visit: Payer: Self-pay

## 2023-01-24 ENCOUNTER — Telehealth (INDEPENDENT_AMBULATORY_CARE_PROVIDER_SITE_OTHER): Payer: No Typology Code available for payment source | Admitting: Psychiatry

## 2023-01-24 DIAGNOSIS — F411 Generalized anxiety disorder: Secondary | ICD-10-CM

## 2023-01-24 DIAGNOSIS — F3181 Bipolar II disorder: Secondary | ICD-10-CM | POA: Diagnosis not present

## 2023-01-24 MED ORDER — HYDROXYZINE PAMOATE 25 MG PO CAPS
100.0000 mg | ORAL_CAPSULE | Freq: Three times a day (TID) | ORAL | 3 refills | Status: DC | PRN
Start: 2023-01-24 — End: 2023-04-09
  Filled 2023-01-24: qty 90, 8d supply, fill #0

## 2023-01-24 MED ORDER — ARIPIPRAZOLE 5 MG PO TABS
5.0000 mg | ORAL_TABLET | Freq: Every day | ORAL | 3 refills | Status: DC
  Filled 2023-01-24: qty 30, 30d supply, fill #0
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
## 2023-01-28 ENCOUNTER — Encounter: Payer: Self-pay | Admitting: *Deleted

## 2023-01-28 NOTE — Progress Notes (Signed)
Pt referred to [facility] Managed Care team after initial 11/27/22 event follow up indicated multiple SDOH insecurities. During f/u call at that time, pt had asked to cancel the post-event PCP appt to get established at [facility] clinic on 4//3/24 bc she preferred to go to Dr. [name] office for f/u.Medicaid managed care noted pt hung up during first call (or phone cut off) and pt did not respond to 2 additional f/u calls. In-basket message sent to [facility] NP, Toy Cookey, notifying her of attempts to support pt and need for PCP f/u, and Ms Doyne Keel responded that she had received this pt status update.

## 2023-02-03 ENCOUNTER — Encounter (HOSPITAL_COMMUNITY): Payer: Self-pay

## 2023-02-03 ENCOUNTER — Ambulatory Visit (INDEPENDENT_AMBULATORY_CARE_PROVIDER_SITE_OTHER): Payer: No Typology Code available for payment source | Admitting: Mental Health

## 2023-02-03 DIAGNOSIS — F3181 Bipolar II disorder: Secondary | ICD-10-CM | POA: Diagnosis not present

## 2023-02-03 DIAGNOSIS — F4310 Post-traumatic stress disorder, unspecified: Secondary | ICD-10-CM

## 2023-02-03 NOTE — Progress Notes (Signed)
THERAPIST PROGRESS NOTE Virtual Visit via Video Note  I connected with Janet Horn on 02/03/23 at 10:00 AM EDT by a video enabled telemedicine application and verified that I am speaking with the correct person using two identifiers.  Location: Patient: home address on file Provider: office   I discussed the limitations of evaluation and management by telemedicine and the availability of in person appointments. The patient expressed understanding and agreed to proceed.  I discussed the assessment and treatment plan with the patient. The patient was provided an opportunity to ask questions and all were answered. The patient agreed with the plan and demonstrated an understanding of the instructions.   The patient was advised to call back or seek an in-person evaluation if the symptoms worsen or if the condition fails to improve as anticipated.  I provided  38 minutes of non-face-to-face time during this encounter.   Dorris Singh, [facility]   Session Time: 10:06 am (38 minutes)  Participation Level: Active  Behavioral Response: CasualAlertWNL  Type of Therapy: Individual Therapy  Treatment Goals addressed: STG: [name] will increase management of moods AEB development of x 3 emotional regulation and distress tolerance skills within the next 90 days.     ProgressTowards Goals: Progressing  Interventions: CBT and Supportive  Summary: Janet Horn is a 22 y.o. female who presents with dx of bipolar 2 and PTSD. Janet Horn presents for session alert and oriented; mood and affect adequate. Speech clear and coherent at normal rate and tone. Shares to feel physically under the weather. Shares for depression to have recently increased with idle suicidal thoughts and not feeling safe with herself. Shares to have been medication compliant however moods were fluctuating in which she would be ok and then moods would dip into severe depression, " I was always up and  down up and down." Shares for medication change to have been beneficial and reports for moods to be stable at this time. Shares presence of stressors of navigating motherhood and was neglecting her self. Shares for stressor with mother and learning to put in place boundaries. Shares will increase in self-care and attending to things she would like to do for herself like taking GED courses. Initial goals; sxs unchanged.   Suicidal/Homicidal: Nowithout intent/plan  Therapist Response: Therapist engaged Janet Horn in tele-therapy session. Completed check in and assessed for current level of functioning; sxs management and level of stressors. Reviewed bounds of confidentiality and informed consent. Supported Janet Horn in processing thoughts and feelings surrounding bipolar dx and learning to increase balance of taking care of self. Educated on importance of self-care and working to utilize natural supports as needed. Explored factors that contribute to sxs of depression and working to increase presence of boundaries with mother and other factors in supporting emotional regulation. Reviewed session, provided follow up; assessed for safety.   Plan: Return again in x 4 weeks.  Diagnosis: Bipolar 2 disorder, major depressive episode  PTSD (post-traumatic stress disorder)  Collaboration of Care: Other None  Patient/Guardian was advised Release of Information must be obtained prior to any record release in order to collaborate their care with an outside provider. Patient/Guardian was advised if they have not already done so to contact the registration department to sign all necessary forms in order for [place] to release information regarding their care.   Consent: Patient/Guardian gives verbal consent for treatment and assignment of benefits for services provided during this visit. Patient/Guardian expressed understanding and agreed to proceed.   Stephan Minister  Randa Evens, [facility] 02/03/2023

## 2023-02-19 ENCOUNTER — Telehealth (HOSPITAL_COMMUNITY): Payer: 59 | Admitting: Psychiatry

## 2023-02-19 ENCOUNTER — Encounter (HOSPITAL_COMMUNITY): Payer: Self-pay

## 2023-04-09 ENCOUNTER — Other Ambulatory Visit: Payer: Self-pay

## 2023-04-09 ENCOUNTER — Encounter (HOSPITAL_COMMUNITY): Payer: Self-pay | Admitting: Psychiatry

## 2023-04-09 ENCOUNTER — Telehealth (INDEPENDENT_AMBULATORY_CARE_PROVIDER_SITE_OTHER): Payer: No Typology Code available for payment source | Admitting: Psychiatry

## 2023-04-09 DIAGNOSIS — F411 Generalized anxiety disorder: Secondary | ICD-10-CM | POA: Diagnosis not present

## 2023-04-09 DIAGNOSIS — F3181 Bipolar II disorder: Secondary | ICD-10-CM | POA: Diagnosis not present

## 2023-04-09 MED ORDER — ARIPIPRAZOLE 5 MG PO TABS
5.0000 mg | ORAL_TABLET | Freq: Every day | ORAL | 3 refills | Status: DC
  Filled 2023-04-09: qty 30, 30d supply, fill #0

## 2023-04-09 MED ORDER — HYDROXYZINE PAMOATE 25 MG PO CAPS
25.0000 mg | ORAL_CAPSULE | Freq: Three times a day (TID) | ORAL | 3 refills | Status: AC | PRN
Start: 2023-04-09 — End: ?
  Filled 2023-04-09: qty 90, 30d supply, fill #0
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
## 2023-04-11 ENCOUNTER — Ambulatory Visit (INDEPENDENT_AMBULATORY_CARE_PROVIDER_SITE_OTHER): Payer: 59 | Admitting: Mental Health

## 2023-04-11 DIAGNOSIS — F411 Generalized anxiety disorder: Secondary | ICD-10-CM

## 2023-04-11 DIAGNOSIS — F3181 Bipolar II disorder: Secondary | ICD-10-CM

## 2023-04-11 DIAGNOSIS — F4310 Post-traumatic stress disorder, unspecified: Secondary | ICD-10-CM
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
## 2023-05-19 ENCOUNTER — Ambulatory Visit (INDEPENDENT_AMBULATORY_CARE_PROVIDER_SITE_OTHER): Payer: 59 | Admitting: Mental Health

## 2023-05-19 DIAGNOSIS — F3181 Bipolar II disorder: Secondary | ICD-10-CM | POA: Diagnosis not present

## 2023-05-19 DIAGNOSIS — F411 Generalized anxiety disorder: Secondary | ICD-10-CM

## 2023-05-19 DIAGNOSIS — F4310 Post-traumatic stress disorder, unspecified: Secondary | ICD-10-CM
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
## 2023-05-20 ENCOUNTER — Encounter: Payer: Self-pay | Admitting: *Deleted

## 2023-05-20 NOTE — Progress Notes (Signed)
Pt attended 11/27/22 screening event where her b/p was 111/81 and her blood sugar was 126. During the initial f/u, pt noted she wanted to cancel the event RN appt made for her with the [facility] in April and verbalized a plan to call another PCP, Dr. Tana Conch. During that initial f/u, pt was also referred to [facility] team. However, the 2nd event follow up chart review indicated pt had not made a PCP appt at that time, at least that was visible in [facility], and that the [facility] Managed care team had not been able to contact her after multiple attempts. Chart review revealed pt had been in contact with a BH counselor and in-basket about PCP f/u sent to her counselor, Janet Horn, [facility], who did acknowledge the message. During this final event follow up, health equity team member again unable to contact pt by phone (VM left) so in-basket messages sent to [facility] Nash Horn, who last saw pt on 04/11/23 and w/ whom pt has future appt on 07/07/23, and with Janet Horn, [facility] NP, with whom pt had her last appt on 04/09/23, and with whom she has a future appt on 07/02/23, offering support to pt and them in establishing pt's care with a PCP and/or any SDOH support or resources.As of 05/21/23, both Janet Horn and Janet Horn responded to the in-basket messages and stated they would pass along this info to f/u with this health equity team member if pt kept her Sept appts. No additional health equity team support scheduled a this time.

## 2023-05-29 ENCOUNTER — Other Ambulatory Visit (HOSPITAL_COMMUNITY): Payer: Self-pay

## 2023-07-02 ENCOUNTER — Telehealth (HOSPITAL_COMMUNITY): Payer: 59 | Admitting: Psychiatry

## 2023-07-02 ENCOUNTER — Encounter (HOSPITAL_COMMUNITY): Payer: Self-pay

## 2023-07-07 ENCOUNTER — Ambulatory Visit (INDEPENDENT_AMBULATORY_CARE_PROVIDER_SITE_OTHER): Payer: 59 | Admitting: Mental Health

## 2023-07-07 DIAGNOSIS — F3181 Bipolar II disorder: Secondary | ICD-10-CM

## 2023-07-07 DIAGNOSIS — F4310 Post-traumatic stress disorder, unspecified: Secondary | ICD-10-CM

## 2023-07-07 NOTE — Progress Notes (Signed)
THERAPIST PROGRESS NOTE Virtual Visit via Video Note  I connected with Janet Horn on 07/07/23 at 10:00 AM EDT by a video enabled telemedicine application and verified that I am speaking with the correct person using two identifiers.  Location: Patient: home address on file Provider: home office   I discussed the limitations of evaluation and management by telemedicine and the availability of in person appointments. The patient expressed understanding and agreed to proceed.  I discussed the assessment and treatment plan with the patient. The patient was provided an opportunity to ask questions and all were answered. The patient agreed with the plan and demonstrated an understanding of the instructions.   The patient was advised to call back or seek an in-person evaluation if the symptoms worsen or if the condition fails to improve as anticipated.  I provided 50 minutes of non-face-to-face time during this encounter.   Janet Horn, [facility]   Session Time: 10:05 am (50 minutes)  Participation Level: Active  Behavioral Response: CasualAlertDysphoric  Type of Therapy: Individual Therapy  Treatment Goals addressed: STG: Janet Horn will increase management of moods AEB development of x 3 emotional regulation and distress tolerance skills within the next 90 days.    ProgressTowards Goals: Progressing  Interventions: CBT and Supportive  Summary: Janet Horn is a 22 y.o. female who presents with dx of bipolar II and PTSD. Janet Horn presents for session alert and oriented; mood and affect low. Speech clear and coherent at normal rate and tone. Shares for moods to have been up and down and denies currently taking medications. Shares thoughts on possibly being pregnant and desire to follow up with provider to assess safety of medications while pregnant. Difficulty taking appointment as awaiting for medicaid to be transferred to [facility]. Shares stressor  of relationship with mother and difficulties with her being her payee, as well as difficulties with communication with her. Reports to feel as if she has to choose mother or boyfriend and trying to make them both happy noting for mother to dislike boyfriend. Notes to be attending school and for this to be going ok however mother will call her to leave early if son starts to cry so son remains at home with father now. Explores with therapist working to set appropriate boundaries with mother to support in redudicng feelings of stress related to mother. Shares upcoming birthday and plans to hang out with a neighbor. Notes working to manage stress with stepping away and having self-awareness of agitation to not react in a way that is not best. Denies SI/HI. Progress towards goals.   Suicidal/Homicidal: Nowithout intent/plan  Therapist Response: Therapist engaged Janet Horn in tele-therapy session. Completed check in and assessed for current level of functioning; sxs management and level of stressors. Supported Janet Horn in processing thoughts and feelings in regards to current stressors and increase in mood lability. Supported in processing though current stressors and exploring ability to manage. Educated on boundaries with mother and processed desire for relationship given childhood. Explored working to explore what is best for her and not engaging in focus on making mother happy and choosing. Encouraged following though with tutoring in school if needed as well as educated on options of health department as well as [facility] pregnancy [facility]. Reviewed session and provided follow up No safety concerns reported.   Plan: Return again in x 5 weeks.  Diagnosis: Bipolar 2 disorder, major depressive episode (HCC)  PTSD (post-traumatic stress disorder)  Collaboration of Care: Other  None  Patient/Guardian was advised Release of Information must be obtained prior to any record release in order to  collaborate their care with an outside provider. Patient/Guardian was advised if they have not already done so to contact the registration department to sign all necessary forms in order for [place] to release information regarding their care.   Consent: Patient/Guardian gives verbal consent for treatment and assignment of benefits for services provided during this visit. Patient/Guardian expressed understanding and agreed to proceed.   Janet Horn, [facility] 07/07/2023

## 2023-07-11 ENCOUNTER — Encounter (HOSPITAL_COMMUNITY): Payer: Self-pay

## 2023-07-11 ENCOUNTER — Telehealth (HOSPITAL_COMMUNITY): Payer: 59 | Admitting: Psychiatry

## 2023-07-16 ENCOUNTER — Other Ambulatory Visit (HOSPITAL_COMMUNITY): Payer: Self-pay

## 2023-07-16 ENCOUNTER — Telehealth (INDEPENDENT_AMBULATORY_CARE_PROVIDER_SITE_OTHER): Payer: 59 | Admitting: Psychiatry

## 2023-07-16 ENCOUNTER — Encounter: Payer: Self-pay | Admitting: Family Medicine

## 2023-07-16 ENCOUNTER — Other Ambulatory Visit: Payer: Self-pay

## 2023-07-16 ENCOUNTER — Encounter (HOSPITAL_COMMUNITY): Payer: Self-pay | Admitting: Psychiatry

## 2023-07-16 ENCOUNTER — Ambulatory Visit (INDEPENDENT_AMBULATORY_CARE_PROVIDER_SITE_OTHER): Payer: 59 | Admitting: Family Medicine

## 2023-07-16 VITALS — Wt 187.1 lb

## 2023-07-16 DIAGNOSIS — Z3009 Encounter for other general counseling and advice on contraception: Secondary | ICD-10-CM

## 2023-07-16 DIAGNOSIS — F411 Generalized anxiety disorder: Secondary | ICD-10-CM | POA: Diagnosis not present

## 2023-07-16 DIAGNOSIS — F3181 Bipolar II disorder: Secondary | ICD-10-CM | POA: Diagnosis not present

## 2023-07-16 DIAGNOSIS — Z3202 Encounter for pregnancy test, result negative: Secondary | ICD-10-CM

## 2023-07-16 DIAGNOSIS — Z309 Encounter for contraceptive management, unspecified: Secondary | ICD-10-CM | POA: Insufficient documentation

## 2023-07-16 LAB — POCT PREGNANCY, URINE: Preg Test, Ur: NEGATIVE

## 2023-07-16 MED ORDER — ARIPIPRAZOLE 10 MG PO TABS
10.0000 mg | ORAL_TABLET | Freq: Every day | ORAL | 3 refills | Status: AC
Start: 2023-07-16 — End: ?
  Filled 2023-07-16: qty 30, 30d supply, fill #0
  Filled 2024-01-12: qty 30, 30d supply, fill #1

## 2023-07-16 MED ORDER — HYDROXYZINE PAMOATE 25 MG PO CAPS
25.0000 mg | ORAL_CAPSULE | Freq: Three times a day (TID) | ORAL | 3 refills | Status: DC | PRN
Start: 2023-07-16 — End: 2024-01-14
  Filled 2023-07-16: qty 90, 30d supply, fill #0

## 2023-07-16 NOTE — Progress Notes (Signed)
    SUBJECTIVE:   CHIEF COMPLAINT / HPI:   Contraceptive management Patient presenting for evaluation due to concerns about her Nexplanon.  She has had her Since March.  Reports that since then she has felt fatigued, increased migraines, intermittent lower pelvic pain and pressure.  She reports that recently she is also had nausea and vomiting worse in the morning, breast tenderness.  She is interested in getting pregnant in the near future and would like a Nexplanon removed.  She reports she took a home pregnancy test but would like to verify that she is not actually pregnant.  PERTINENT  PMH / PSH: Nexplanon in place  OBJECTIVE:   Wt 187 lb 1.6 oz (84.9 kg)   BMI 34.22 kg/m   General: Well-appearing, no acute distress Cardiac: Regular rate Respiratory: Breathing Abdomen: Soft, nontender MSK: No gross abnormalities  Nexplanon Removal Patient identified, informed consent performed, consent signed.   Appropriate time out taken. Nexplanon site identified.  Area prepped in usual sterile fashon. One ml of 1% lidocaine was used to anesthetize the area at the distal end of the implant. A small stab incision was made right beside the implant on the distal portion.  The Nexplanon rod was grasped using hemostats and removed without difficulty.  There was minimal blood loss. There were no complications.  3 ml of 1% lidocaine was injected around the incision for post-procedure analgesia.  Steri-strips were applied over the small incision.  A pressure bandage was applied to reduce any bruising.  The patient tolerated the procedure well and was given post procedure instructions.  Patient is planning to not use contraception.   ASSESSMENT/PLAN:   Contraceptive management Patient presenting for evaluation for contraceptive management.  Reports that she has had a number of issues since having the Nexplanon placed.  She reports nausea, breast tenderness, vomiting in the mornings for the last few weeks.   Was concerned she was pregnant and has taken an at home pregnancy test but is concerned it was a false negative and would like 1 here.  She also would like her Nexplanon removed.  Discussed at length other contraceptive methods and patient is not interested in these at this time.  She is considering pregnancy in the next few months.  Discussion regarding establishing for care when she becomes pregnant and patient is agreeable.  Nexplanon removed without difficulty.  Please see procedure note above.     Celedonio Savage, MD [facility]

## 2023-07-16 NOTE — Assessment & Plan Note (Addendum)
Patient presenting for evaluation for contraceptive management.  Reports that she has had a number of issues since having the Nexplanon placed.  She reports nausea, breast tenderness, vomiting in the mornings for the last few weeks.  Was concerned she was pregnant and has taken an at home pregnancy test but is concerned it was a false negative and would like 1 here.  She also would like her Nexplanon removed.  Discussed at length other contraceptive methods and patient is not interested in these at this time.  She is considering pregnancy in the next few months.  Discussion regarding establishing for care when she becomes pregnant and patient is agreeable.  Nexplanon removed without difficulty.  Please see procedure note above.
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
## 2023-08-18 ENCOUNTER — Ambulatory Visit (INDEPENDENT_AMBULATORY_CARE_PROVIDER_SITE_OTHER): Payer: 59 | Admitting: Mental Health

## 2023-08-18 ENCOUNTER — Encounter (HOSPITAL_COMMUNITY): Payer: Self-pay

## 2023-08-18 DIAGNOSIS — F411 Generalized anxiety disorder: Secondary | ICD-10-CM

## 2023-08-18 DIAGNOSIS — F3181 Bipolar II disorder: Secondary | ICD-10-CM | POA: Diagnosis not present

## 2023-08-18 DIAGNOSIS — F4310 Post-traumatic stress disorder, unspecified: Secondary | ICD-10-CM | POA: Diagnosis not present

## 2023-08-18 NOTE — Progress Notes (Unsigned)
THERAPIST PROGRESS NOTE Virtual Visit via Video Note  I connected with Janet Horn on 08/18/23 at  1:00 PM EST by a video enabled telemedicine application and verified that I am speaking with the correct person using two identifiers.  Location: Patient: home address on file Provider: home office   I discussed the limitations of evaluation and management by telemedicine and the availability of in person appointments. The patient expressed understanding and agreed to proceed.  I discussed the assessment and treatment plan with the patient. The patient was provided an opportunity to ask questions and all were answered. The patient agreed with the plan and demonstrated an understanding of the instructions.   The patient was advised to call back or seek an in-person evaluation if the symptoms worsen or if the condition fails to improve as anticipated.  I provided 50 minutes of non-face-to-face time during this encounter.   Dorris Singh, [facility]   Session Time: 1:06 pm( 50 minutes)  Participation Level: Active  Behavioral Response: CasualAlertEuthymic  Type of Therapy: Individual Therapy  Treatment Goals addressed: STG: [name] will increase management of moods AEB daily medication management with a ability to engage in self-care and coping skills daily within the next 90 days  Progress Towards Goals: Progressing  Interventions: CBT and Supportive  Summary:  Janet Horn is a 22 y.o. female who presents with dx of bipolar II and PTSD. Janet Horn presents for session alert and oriented; mood and affect low initially. Speech clear and coherent at normal rate and tone. Shares for moods to have been slightly depressed and shares to have been having thoughts negative towards self. Shares stressor of grand-father to not be currently doing well and is expected to pass soon. Shares thoughts on relationship with him and lacking father in her life. Shares  concerning relationship with mother and reads letter mother wrote to her and letter she wrote to mother. Notes improvement in relationship currently and shares recent time iin which she had no contact with mother for x  1 month. Denies taking medications and shares thoughts on not wanting to become addicted and not feeling as if medications work however denies consistent medication compliance as well. Shares to continue to be attending school to obtain GED and for that to be going well. Engages with therapist and explores working to consistently take medication and consistently engage in self-care with positive affirmations and use of coping skills to support in maintaining balanced moods. Agrees to review mental health maintenance plan as well as other coping skill work sheets in per possession. Treatment plan updated and reviewed. Denies safety concerns.   Suicidal/Homicidal: Nowithout intent/plan  Therapist Response: Therapist engaged Janet Horn in tele-therapy session. Completed check in and assessed for current level of functioning; sxs management and level of stressors. Supported Janet Horn in processing thoughts and feelings in regards to current stressors and increase in negative thinking of self. Provided supportive feedback in processing current thoughts. Educated on importance of medication compliance and educated on current medications to not be addictive in nature. Explored working to increase self-care with adequate sleep and nutrition as well as engaging in coping skills and balance thinking to reframe negative thoughts as they approach of self. Supported pt in identifying positive attributes and things in which she has been abel to accomplish. Reviewed session, discussed treatment plan and encouraged to update mental health work sheets. No safety concerns.   Plan: Return again in  x 7 due to holiday weeks.  Diagnosis: Bipolar  2 disorder, major depressive episode (HCC)  PTSD (post-traumatic stress  disorder)  Generalized anxiety disorder  Collaboration of Care: Other None  Patient/Guardian was advised Release of Information must be obtained prior to any record release in order to collaborate their care with an outside provider. Patient/Guardian was advised if they have not already done so to contact the registration department to sign all necessary forms in order for [place] to release information regarding their care.   Consent: Patient/Guardian gives verbal consent for treatment and assignment of benefits for services provided during this visit. Patient/Guardian expressed understanding and agreed to proceed.   Stephan Minister [place], [facility] 08/18/2023

## 2023-08-20 DIAGNOSIS — F39 Unspecified mood [affective] disorder: Secondary | ICD-10-CM | POA: Diagnosis not present

## 2023-08-20 DIAGNOSIS — N926 Irregular menstruation, unspecified: Secondary | ICD-10-CM | POA: Diagnosis not present

## 2023-08-20 DIAGNOSIS — E6609 Other obesity due to excess calories: Secondary | ICD-10-CM | POA: Diagnosis not present

## 2023-08-20 DIAGNOSIS — D509 Iron deficiency anemia, unspecified: Secondary | ICD-10-CM | POA: Diagnosis not present

## 2023-08-20 DIAGNOSIS — R7303 Prediabetes: Secondary | ICD-10-CM | POA: Diagnosis not present

## 2023-08-20 DIAGNOSIS — E559 Vitamin D deficiency, unspecified: Secondary | ICD-10-CM | POA: Diagnosis not present

## 2023-08-20 DIAGNOSIS — R519 Headache, unspecified: Secondary | ICD-10-CM | POA: Diagnosis not present

## 2023-08-20 LAB — BETA HCG QUANT (REF LAB)
Hemoglobin A1C: 5.7
Quantitative HCG: 211

## 2023-08-26 ENCOUNTER — Encounter: Payer: Self-pay | Admitting: Family Medicine

## 2023-08-28 ENCOUNTER — Encounter: Payer: Self-pay | Admitting: *Deleted

## 2023-08-28 ENCOUNTER — Encounter: Payer: Self-pay | Admitting: Family Medicine

## 2023-08-29 ENCOUNTER — Encounter: Payer: Self-pay | Admitting: Family Medicine

## 2023-09-01 ENCOUNTER — Ambulatory Visit: Payer: 59

## 2023-09-01 ENCOUNTER — Other Ambulatory Visit (HOSPITAL_COMMUNITY): Payer: Self-pay

## 2023-09-01 ENCOUNTER — Other Ambulatory Visit: Payer: 59

## 2023-09-01 MED ORDER — VITAMIN D (CHOLECALCIFEROL) 50 MCG (2000 UT) PO CAPS
2000.0000 [IU] | ORAL_CAPSULE | Freq: Every day | ORAL | 5 refills | Status: DC
  Filled 2023-09-01: qty 30, 30d supply, fill #0

## 2023-09-02 ENCOUNTER — Encounter: Payer: Self-pay | Admitting: Pharmacist

## 2023-09-02 ENCOUNTER — Other Ambulatory Visit: Payer: Self-pay

## 2023-09-05 ENCOUNTER — Other Ambulatory Visit: Payer: Self-pay

## 2023-09-24 ENCOUNTER — Inpatient Hospital Stay (HOSPITAL_COMMUNITY)
Admission: AD | Admit: 2023-09-24 | Discharge: 2023-09-24 | Disposition: A | Discharge status: Home / Self Care | Payer: 59 | Attending: Obstetrics & Gynecology | Admitting: Obstetrics & Gynecology

## 2023-09-24 ENCOUNTER — Encounter: Payer: Self-pay | Admitting: *Deleted

## 2023-09-24 ENCOUNTER — Encounter (HOSPITAL_COMMUNITY): Payer: Self-pay | Admitting: Obstetrics and Gynecology

## 2023-09-24 ENCOUNTER — Inpatient Hospital Stay (HOSPITAL_COMMUNITY): Payer: 59

## 2023-09-24 ENCOUNTER — Telehealth: Payer: Self-pay | Admitting: Family Medicine

## 2023-09-24 DIAGNOSIS — O219 Vomiting of pregnancy, unspecified: Secondary | ICD-10-CM | POA: Diagnosis not present

## 2023-09-24 DIAGNOSIS — Z3A09 9 weeks gestation of pregnancy: Secondary | ICD-10-CM | POA: Diagnosis not present

## 2023-09-24 DIAGNOSIS — R42 Dizziness and giddiness: Secondary | ICD-10-CM | POA: Diagnosis not present

## 2023-09-24 DIAGNOSIS — Z79899 Other long term (current) drug therapy: Secondary | ICD-10-CM | POA: Diagnosis not present

## 2023-09-24 DIAGNOSIS — O26899 Other specified pregnancy related conditions, unspecified trimester: Secondary | ICD-10-CM

## 2023-09-24 DIAGNOSIS — O26891 Other specified pregnancy related conditions, first trimester: Secondary | ICD-10-CM | POA: Insufficient documentation

## 2023-09-24 DIAGNOSIS — R109 Unspecified abdominal pain: Secondary | ICD-10-CM | POA: Diagnosis not present

## 2023-09-24 DIAGNOSIS — O208 Other hemorrhage in early pregnancy: Secondary | ICD-10-CM | POA: Diagnosis not present

## 2023-09-24 HISTORY — DX: Herpesviral infection of urogenital system, unspecified: A60.00

## 2023-09-24 HISTORY — DX: Headache, unspecified: R51.9

## 2023-09-24 LAB — CBC
HCT: 40 % (ref 36.0–46.0)
Hemoglobin: 12.8 g/dL (ref 12.0–15.0)
MCH: 23.4 pg — ABNORMAL LOW (ref 26.0–34.0)
MCHC: 32 g/dL (ref 30.0–36.0)
MCV: 73.3 fL — ABNORMAL LOW (ref 80.0–100.0)
Platelets: 326 10*3/uL (ref 150–400)
RBC: 5.46 MIL/uL — ABNORMAL HIGH (ref 3.87–5.11)
RDW: 14.4 % (ref 11.5–15.5)
WBC: 8.9 10*3/uL (ref 4.0–10.5)
nRBC: 0 % (ref 0.0–0.2)

## 2023-09-24 LAB — URINALYSIS, ROUTINE W REFLEX MICROSCOPIC
Bilirubin Urine: NEGATIVE
Glucose, UA: NEGATIVE mg/dL
Hgb urine dipstick: NEGATIVE
Ketones, ur: NEGATIVE mg/dL
Nitrite: NEGATIVE
Protein, ur: 30 mg/dL — AB
Specific Gravity, Urine: 1.026 (ref 1.005–1.030)
pH: 6 (ref 5.0–8.0)

## 2023-09-24 LAB — WET PREP, GENITAL
Clue Cells Wet Prep HPF POC: NONE SEEN
Sperm: NONE SEEN
Trich, Wet Prep: NONE SEEN
WBC, Wet Prep HPF POC: >=10 — AB (ref <10)
Yeast Wet Prep HPF POC: NONE SEEN

## 2023-09-24 LAB — COMPREHENSIVE METABOLIC PANEL
ALT: 17 U/L (ref 0–44)
AST: 17 U/L (ref 15–41)
Albumin: 3.7 g/dL (ref 3.5–5.0)
Alkaline Phosphatase: 71 U/L (ref 38–126)
Anion gap: 15 (ref 5–15)
BUN: 5 mg/dL — ABNORMAL LOW (ref 6–20)
CO2: 25 mmol/L (ref 22–32)
Calcium: 9.8 mg/dL (ref 8.9–10.3)
Chloride: 99 mmol/L (ref 98–111)
Creatinine, Ser: 0.68 mg/dL (ref 0.44–1.00)
GFR, Estimated: >60 mL/min (ref >60)
Glucose, Bld: 97 mg/dL (ref 70–99)
Potassium: 4 mmol/L (ref 3.5–5.1)
Sodium: 139 mmol/L (ref 135–145)
Total Bilirubin: 0.4 mg/dL (ref <1.2)
Total Protein: 7.4 g/dL (ref 6.5–8.1)

## 2023-09-24 LAB — HCG, QUANTITATIVE, PREGNANCY: hCG, Beta Chain, Quant, S: >250000 m[IU]/mL — ABNORMAL HIGH (ref <5)

## 2023-09-24 MED ORDER — PROMETHAZINE HCL 25 MG PO TABS
25.0000 mg | ORAL_TABLET | Freq: Once | ORAL | Status: AC
  Administered 2023-09-24: 25 mg via ORAL
  Filled 2023-09-24: qty 1

## 2023-09-24 MED ORDER — ACETAMINOPHEN-CAFFEINE 500-65 MG PO TABS: 2.0000 | ORAL_TABLET | Freq: Once | ORAL | Status: DC

## 2023-09-24 MED ORDER — ONDANSETRON 4 MG PO TBDP: 8.0000 mg | ORAL_TABLET | Freq: Once | ORAL | Status: DC

## 2023-09-24 MED ORDER — SCOPOLAMINE 1 MG/3DAYS TD PT72
1.0000 | MEDICATED_PATCH | Freq: Once | TRANSDERMAL | Status: DC
  Administered 2023-09-24: 1.5 mg via TRANSDERMAL
  Filled 2023-09-24: qty 1

## 2023-09-24 MED ORDER — SCOPOLAMINE 1 MG/3DAYS TD PT72
1.0000 | MEDICATED_PATCH | TRANSDERMAL | 0 refills | Status: DC
  Filled 2023-09-24: qty 10, 30d supply, fill #0

## 2023-09-24 MED ORDER — PROMETHAZINE HCL 12.5 MG PO TABS
12.5000 mg | ORAL_TABLET | Freq: Four times a day (QID) | ORAL | 0 refills | Status: DC | PRN
  Filled 2023-09-24: qty 30, 8d supply, fill #0
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
## 2023-09-24 NOTE — Telephone Encounter (Signed)
Patient has not been able to keep anything down and would like to speak to a nurse about it

## 2023-09-24 NOTE — MAU Note (Addendum)
Janet Horn is a 22 y.o. at Unknown here in MAU reporting: for the last 2 wks, has had very bad vomiting.  Hasn't really been able to keep anything down.  Primary dr prescribed Zofran, stopped taking it because it wasn't working.  OB appt in 12/30, [place] appt 12/18. Nexplanon removed ? Sometime in Oct. Reports passing out from vomiting. No pain or bleeding.  LMP: never had  period  (neg urine test 10/2, HCG 211 on 11/6 Onset of complaint: 2 wks  Pain score: none Vitals:   09/24/23 1610  BP: 122/68  Pulse: 96  Resp: 16  Temp: 98.9 F (37.2 C)  SpO2: 100%     Lab orders placed from triage:  urine   Has a migraine, messes with vision and also causes nausea

## 2023-09-24 NOTE — Telephone Encounter (Signed)
Per chart review patient is in MAU being evaluated for these complaints at present. Nancy Fetter

## 2023-09-24 NOTE — MAU Note (Signed)
Feeling a lot better.  Nausea medication has really helped.   HA went away as well.

## 2023-09-25 ENCOUNTER — Other Ambulatory Visit (HOSPITAL_COMMUNITY): Payer: Self-pay

## 2023-09-25 ENCOUNTER — Other Ambulatory Visit: Payer: Self-pay

## 2023-09-25 LAB — CULTURE, OB URINE

## 2023-09-25 LAB — GC/CHLAMYDIA PROBE AMP (~~LOC~~) NOT AT ARMC
Chlamydia: NEGATIVE
Comment: NEGATIVE
Comment: NORMAL
Neisseria Gonorrhea: NEGATIVE

## 2023-09-25 MED ORDER — PROMETHAZINE HCL 12.5 MG PO TABS: 12.5000 mg | ORAL_TABLET | Freq: Four times a day (QID) | ORAL | 0 refills | Status: DC | PRN

## 2023-09-25 MED ORDER — METOCLOPRAMIDE HCL 10 MG PO TABS
10.0000 mg | ORAL_TABLET | Freq: Two times a day (BID) | ORAL | 0 refills | Status: DC
  Filled 2023-09-25 – 2023-09-26 (×2): qty 14, 7d supply, fill #0

## 2023-09-26 ENCOUNTER — Other Ambulatory Visit (HOSPITAL_COMMUNITY): Payer: Self-pay

## 2023-09-29 ENCOUNTER — Telehealth (INDEPENDENT_AMBULATORY_CARE_PROVIDER_SITE_OTHER): Payer: 59 | Admitting: Psychiatry

## 2023-09-29 ENCOUNTER — Encounter (HOSPITAL_COMMUNITY): Payer: Self-pay | Admitting: Psychiatry

## 2023-09-29 DIAGNOSIS — F3181 Bipolar II disorder: Secondary | ICD-10-CM
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
## 2023-10-01 ENCOUNTER — Ambulatory Visit (INDEPENDENT_AMBULATORY_CARE_PROVIDER_SITE_OTHER): Payer: 59

## 2023-10-01 ENCOUNTER — Other Ambulatory Visit: Payer: Self-pay

## 2023-10-01 ENCOUNTER — Telehealth (HOSPITAL_COMMUNITY): Payer: 59 | Admitting: Psychiatry

## 2023-10-01 DIAGNOSIS — Z3491 Encounter for supervision of normal pregnancy, unspecified, first trimester: Secondary | ICD-10-CM

## 2023-10-01 DIAGNOSIS — Z3A10 10 weeks gestation of pregnancy: Secondary | ICD-10-CM | POA: Diagnosis not present

## 2023-10-13 ENCOUNTER — Ambulatory Visit (INDEPENDENT_AMBULATORY_CARE_PROVIDER_SITE_OTHER): Payer: 59 | Admitting: Family Medicine

## 2023-10-13 ENCOUNTER — Encounter: Payer: Self-pay | Admitting: Family Medicine

## 2023-10-13 ENCOUNTER — Other Ambulatory Visit: Payer: Self-pay

## 2023-10-13 VITALS — BP 132/76 | HR 76 | Wt 186.1 lb

## 2023-10-13 DIAGNOSIS — O0990 Supervision of high risk pregnancy, unspecified, unspecified trimester: Secondary | ICD-10-CM | POA: Diagnosis not present

## 2023-10-13 DIAGNOSIS — Z3A11 11 weeks gestation of pregnancy: Secondary | ICD-10-CM | POA: Diagnosis not present

## 2023-10-13 DIAGNOSIS — O99210 Obesity complicating pregnancy, unspecified trimester: Secondary | ICD-10-CM

## 2023-10-13 DIAGNOSIS — Z8632 Personal history of gestational diabetes: Secondary | ICD-10-CM

## 2023-10-13 DIAGNOSIS — Z1332 Encounter for screening for maternal depression: Secondary | ICD-10-CM

## 2023-10-13 DIAGNOSIS — O99211 Obesity complicating pregnancy, first trimester: Secondary | ICD-10-CM

## 2023-10-13 DIAGNOSIS — B009 Herpesviral infection, unspecified: Secondary | ICD-10-CM

## 2023-10-13 DIAGNOSIS — R7303 Prediabetes: Secondary | ICD-10-CM

## 2023-10-13 DIAGNOSIS — O0991 Supervision of high risk pregnancy, unspecified, first trimester: Secondary | ICD-10-CM
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
## 2023-10-14 DIAGNOSIS — O0990 Supervision of high risk pregnancy, unspecified, unspecified trimester: Secondary | ICD-10-CM | POA: Insufficient documentation

## 2023-10-14 LAB — CBC/D/PLT+RPR+RH+ABO+RUBIGG...
Antibody Screen: NEGATIVE
Basophils Absolute: 0 10*3/uL (ref 0.0–0.2)
Basos: 0 %
EOS (ABSOLUTE): 0.1 10*3/uL (ref 0.0–0.4)
Eos: 1 %
HCV Ab: NONREACTIVE
HIV Screen 4th Generation wRfx: NONREACTIVE
Hematocrit: 40.6 % (ref 34.0–46.6)
Hemoglobin: 13.3 g/dL (ref 11.1–15.9)
Hepatitis B Surface Ag: NEGATIVE
Immature Grans (Abs): 0 10*3/uL (ref 0.0–0.1)
Immature Granulocytes: 0 %
Lymphocytes Absolute: 1.5 10*3/uL (ref 0.7–3.1)
Lymphs: 22 %
MCH: 24.3 pg — ABNORMAL LOW (ref 26.6–33.0)
MCHC: 32.8 g/dL (ref 31.5–35.7)
MCV: 74 fL — ABNORMAL LOW (ref 79–97)
Monocytes Absolute: 0.4 10*3/uL (ref 0.1–0.9)
Monocytes: 6 %
Neutrophils Absolute: 4.6 10*3/uL (ref 1.4–7.0)
Neutrophils: 71 %
Platelets: 307 10*3/uL (ref 150–450)
RBC: 5.47 x10E6/uL — ABNORMAL HIGH (ref 3.77–5.28)
RDW: 15 % (ref 11.7–15.4)
RPR Ser Ql: NONREACTIVE
Rh Factor: POSITIVE
Rubella Antibodies, IGG: 4.45 {index} (ref >0.99)
WBC: 6.6 10*3/uL (ref 3.4–10.8)

## 2023-10-14 LAB — HEMOGLOBIN A1C
Est. average glucose Bld gHb Est-mCnc: 111 mg/dL
Hgb A1c MFr Bld: 5.5 % (ref 4.8–5.6)

## 2023-10-14 LAB — HCV INTERPRETATION

## 2023-10-16 ENCOUNTER — Encounter: Payer: Self-pay | Admitting: *Deleted

## 2023-10-18 ENCOUNTER — Other Ambulatory Visit (HOSPITAL_COMMUNITY): Payer: Self-pay

## 2023-10-18 LAB — PANORAMA PRENATAL TEST FULL PANEL:PANORAMA TEST PLUS 5 ADDITIONAL MICRODELETIONS: FETAL FRACTION: 10

## 2023-10-18 MED ORDER — SCOPOLAMINE 1 MG/3DAYS TD PT72
MEDICATED_PATCH | TRANSDERMAL | 0 refills | Status: DC
  Filled 2023-10-18: qty 10, 30d supply, fill #0

## 2023-10-22 ENCOUNTER — Other Ambulatory Visit (HOSPITAL_COMMUNITY): Payer: Self-pay

## 2023-10-23 ENCOUNTER — Ambulatory Visit (INDEPENDENT_AMBULATORY_CARE_PROVIDER_SITE_OTHER): Payer: 59 | Admitting: Mental Health

## 2023-10-23 DIAGNOSIS — F411 Generalized anxiety disorder: Secondary | ICD-10-CM

## 2023-10-23 DIAGNOSIS — F3181 Bipolar II disorder: Secondary | ICD-10-CM | POA: Diagnosis not present

## 2023-10-23 DIAGNOSIS — F4310 Post-traumatic stress disorder, unspecified: Secondary | ICD-10-CM

## 2023-10-23 NOTE — Progress Notes (Signed)
 THERAPIST PROGRESS NOTE Virtual Visit via Video Note  I connected with Janet Horn on 10/23/23 at  2:00 PM EST by a video enabled telemedicine application and verified that I am speaking with the correct person using two identifiers.  Location: Patient: home address on file Provider: office   I discussed the limitations of evaluation and management by telemedicine and the availability of in person appointments. The patient expressed understanding and agreed to proceed.  I discussed the assessment and treatment plan with the patient. The patient was provided an opportunity to ask questions and all were answered. The patient agreed with the plan and demonstrated an understanding of the instructions.   The patient was advised to call back or seek an in-person evaluation if the symptoms worsen or if the condition fails to improve as anticipated.  I provided 33 minutes of non-face-to-face time during this encounter.   Ty Bernice Savant, [facility]   Session Time: 2:05 pm ( 33 minutes)  Participation Level: Active  Behavioral Response: CasualAlertEuthymic  Type of Therapy: Individual Therapy  Treatment Goals addressed: STG: Janet Kaylene will increase management of moods AEB daily medication management with a ability to engage in self-care and coping skills daily within the next 90 days   ProgressTowards Goals: Progressing  Interventions: Supportive  Summary: Janet Horn is a 23 y.o. female who presents with dx of bipolar II and PTSD. Janet presents for session alert and oriented; mood and affect adequate; stable. Speech clear and coherent at normal rate and tone. Shares for moods to have been stable and denies excessive highs or lows. Notes to have ceased medication with concern for current dosage to be harmful to new pregnancy, noting to be [redacted] weeks pregnant with a baby girl. Shares for this current pregnancy to be worse than the first with a high degree of  nausea. Shares to have had to be out of school fr x 2 weeks. Shares for mother to be displeased with pregnancy due to dislike of her boyfriend. Shares for boyfriend to be on disability as well and for them to struggle with finances. Notes stressor of trying to get son's medicaid reinstated, trying for eye doctor that takes her insurance and continuing to navigate school. Notes would like to be able to be her own payee as well as get employment once things settle with school and having her daughter who is due in July. Shares overall to be doing well and working to navigate stress taking it one day at a time. Denies safety concerns. Sxs stable, ongoing progress with goals.    Suicidal/Homicidal: Nowithout intent/plan  Therapist Response: Therapist engaged Janet in tele-therapy session. Completed check in and assessed for current level of functioning; sxs management and level of stressors. Supported Janet in processing thoughts and feelings in regards to current stressors and new pregnancy. Provided safe space to share thoughts and feelings in regard to pregnancy and level of supports in place. Explored current mood sxs and ability to navigate stressors. Encouraged monitoring of moods and reaching out to medication provider if excessive highs or lows present for exploration of safe medication with pregnancy. Provided support and encouragement and encouraged relaxation and seeking support from partner as needed. Reviewed session and provided follo wup.   Plan: Return again in  x 2 weeks.  Diagnosis: Bipolar 2 disorder, major depressive episode (HCC)  PTSD (post-traumatic stress disorder)  Generalized anxiety disorder  Collaboration of Care: Other None  Patient/Guardian was advised Release of Information must be  obtained prior to any record release in order to collaborate their care with an outside provider. Patient/Guardian was advised if they have not already done so to contact the registration  department to sign all necessary forms in order for us  to release information regarding their care.   Consent: Patient/Guardian gives verbal consent for treatment and assignment of benefits for services provided during this visit. Patient/Guardian expressed understanding and agreed to proceed.   Ty Asal [place] [facility], [facility] 10/23/2023

## 2023-10-31 DIAGNOSIS — K0889 Other specified disorders of teeth and supporting structures: Secondary | ICD-10-CM | POA: Diagnosis not present

## 2023-10-31 DIAGNOSIS — H9201 Otalgia, right ear: Secondary | ICD-10-CM | POA: Diagnosis not present

## 2023-11-01 ENCOUNTER — Other Ambulatory Visit (HOSPITAL_COMMUNITY): Payer: Self-pay

## 2023-11-03 ENCOUNTER — Other Ambulatory Visit (HOSPITAL_COMMUNITY): Payer: Self-pay

## 2023-11-04 ENCOUNTER — Other Ambulatory Visit: Payer: Self-pay

## 2023-11-04 ENCOUNTER — Other Ambulatory Visit (HOSPITAL_COMMUNITY): Payer: Self-pay

## 2023-11-04 ENCOUNTER — Encounter (HOSPITAL_COMMUNITY): Payer: Self-pay | Admitting: Psychiatry

## 2023-11-04 ENCOUNTER — Telehealth (INDEPENDENT_AMBULATORY_CARE_PROVIDER_SITE_OTHER): Payer: 59 | Admitting: Psychiatry

## 2023-11-04 DIAGNOSIS — F3181 Bipolar II disorder: Secondary | ICD-10-CM

## 2023-11-04 MED ORDER — ACETAMINOPHEN 325 MG PO TABS
650.0000 mg | ORAL_TABLET | Freq: Four times a day (QID) | ORAL | 0 refills | Status: DC | PRN
  Filled 2023-11-04: qty 240, 30d supply, fill #0
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
## 2023-11-06 ENCOUNTER — Ambulatory Visit (INDEPENDENT_AMBULATORY_CARE_PROVIDER_SITE_OTHER): Payer: Medicaid Other | Admitting: Mental Health

## 2023-11-06 DIAGNOSIS — F4310 Post-traumatic stress disorder, unspecified: Secondary | ICD-10-CM | POA: Diagnosis not present

## 2023-11-06 DIAGNOSIS — F3181 Bipolar II disorder: Secondary | ICD-10-CM | POA: Diagnosis not present
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
## 2023-11-10 ENCOUNTER — Other Ambulatory Visit: Payer: 59

## 2023-11-10 ENCOUNTER — Ambulatory Visit (INDEPENDENT_AMBULATORY_CARE_PROVIDER_SITE_OTHER): Payer: 59 | Admitting: Family Medicine

## 2023-11-10 ENCOUNTER — Other Ambulatory Visit (HOSPITAL_COMMUNITY)
Admission: RE | Admit: 2023-11-10 | Discharge: 2023-11-10 | Disposition: A | Discharge status: Home / Self Care | Payer: Medicaid Other | Source: Ambulatory Visit | Attending: Family Medicine | Admitting: Family Medicine

## 2023-11-10 ENCOUNTER — Other Ambulatory Visit: Payer: Self-pay

## 2023-11-10 VITALS — BP 131/82 | HR 92 | Wt 184.5 lb

## 2023-11-10 DIAGNOSIS — O0992 Supervision of high risk pregnancy, unspecified, second trimester: Secondary | ICD-10-CM

## 2023-11-10 DIAGNOSIS — N898 Other specified noninflammatory disorders of vagina: Secondary | ICD-10-CM | POA: Insufficient documentation

## 2023-11-10 DIAGNOSIS — O26892 Other specified pregnancy related conditions, second trimester: Secondary | ICD-10-CM

## 2023-11-10 DIAGNOSIS — O0990 Supervision of high risk pregnancy, unspecified, unspecified trimester: Secondary | ICD-10-CM

## 2023-11-10 DIAGNOSIS — Z8632 Personal history of gestational diabetes: Secondary | ICD-10-CM

## 2023-11-10 DIAGNOSIS — O99210 Obesity complicating pregnancy, unspecified trimester: Secondary | ICD-10-CM

## 2023-11-10 DIAGNOSIS — Z3A15 15 weeks gestation of pregnancy: Secondary | ICD-10-CM

## 2023-11-10 DIAGNOSIS — B009 Herpesviral infection, unspecified: Secondary | ICD-10-CM

## 2023-11-10 NOTE — Progress Notes (Signed)
   PRENATAL VISIT NOTE  Subjective:  Janet Horn is a 23 y.o. G3P1011 at [redacted]w[redacted]d being seen today for ongoing prenatal care.  She is currently monitored for the following issues for this low-risk pregnancy and has MDD (major depressive disorder), recurrent, severe, with psychosis (HCC); PTSD (post-traumatic stress disorder); Adjustment disorder with mixed emotional features; History of drug use; HSV (herpes simplex virus) infection; History of gestational diabetes; Generalized anxiety disorder; Bipolar 2 disorder, major depressive episode (HCC); Prediabetes; Obesity during pregnancy, antepartum; and Supervision of high risk pregnancy, antepartum on their problem list.  Patient reports no bleeding, no contractions, no cramping, and no leaking.  Contractions: Not present. Vag. Bleeding: None.  Movement: Absent. Denies leaking of fluid.   The following portions of the patient's history were reviewed and updated as appropriate: allergies, current medications, past family history, past medical history, past social history, past surgical history and problem list.   Objective:   Vitals:   11/10/23 0847  BP: 131/82  Pulse: 92  Weight: 184 lb 8 oz (83.7 kg)    Fetal Status: Fetal Heart Rate (bpm): 155   Movement: Absent     General:  Alert, oriented and cooperative. Patient is in no acute distress.  Skin: Skin is warm and dry. No rash noted.   Cardiovascular: Normal heart rate noted  Respiratory: Normal respiratory effort, no problems with respiration noted  Abdomen: Soft, gravid, appropriate for gestational age.  Pain/Pressure: Absent     Pelvic: Cervical exam deferred        Extremities: Normal range of motion.  Edema: None  Mental Status: Normal mood and affect. Normal behavior. Normal judgment and thought content.   Assessment and Plan:  Pregnancy: G3P1011 at [redacted]w[redacted]d 1. Supervision of high risk pregnancy, antepartum (Primary) FHR and BP appropriate today Early GTT today  2.  Vaginal discharge during pregnancy in second trimester Self swab collected today - Cervicovaginal ancillary only  3. History of gestational diabetes Early GTT today  4. Obesity during pregnancy, antepartum  5. HSV (herpes simplex virus) infection Will need prophylaxis at 34 to 36 weeks Currently on Valtrex with outbreaks  6. [redacted] weeks gestation of pregnancy - AFP, Serum, Open Spina Bifida  Preterm labor symptoms and general obstetric precautions including but not limited to vaginal bleeding, contractions, leaking of fluid and fetal movement were reviewed in detail with the patient. Please refer to After Visit Summary for other counseling recommendations.   No follow-ups on file.  Future Appointments  Date Time Provider Department Center  11/20/2023  2:00 PM Myrtie Neither [facility] None  12/02/2023  7:15 AM [facility] NURSE [facility]  12/02/2023  7:30 AM [facility] US2 [facility]  12/10/2023  1:15 PM Celedonio Savage, MD [facility]  01/01/2024  2:00 PM Myrtie Neither, [facility] None  01/07/2024  1:35 PM Celedonio Savage, MD [facility]  01/14/2024 10:00 AM Shanna Cisco, NP [facility] None    Celedonio Savage, MD

## 2023-11-11 ENCOUNTER — Encounter: Payer: Self-pay | Admitting: Family Medicine

## 2023-11-11 ENCOUNTER — Other Ambulatory Visit: Payer: Self-pay

## 2023-11-11 LAB — CERVICOVAGINAL ANCILLARY ONLY
Bacterial Vaginitis (gardnerella): POSITIVE — AB
Candida Glabrata: NEGATIVE
Candida Vaginitis: NEGATIVE
Comment: NEGATIVE
Comment: NEGATIVE
Comment: NEGATIVE

## 2023-11-11 LAB — GLUCOSE TOLERANCE, 2 HOURS W/ 1HR
Glucose, 1 hour: 131 mg/dL (ref 70–179)
Glucose, 2 hour: 100 mg/dL (ref 70–152)
Glucose, Fasting: 93 mg/dL — ABNORMAL HIGH (ref 70–91)

## 2023-11-11 MED ORDER — METRONIDAZOLE 500 MG PO TABS
500.0000 mg | ORAL_TABLET | Freq: Two times a day (BID) | ORAL | 0 refills | Status: DC
  Filled 2023-11-11: qty 14, 7d supply, fill #0

## 2023-11-11 NOTE — Addendum Note (Signed)
Addended by: Celedonio Savage on: 11/11/2023 01:21 PM   Modules accepted: Orders

## 2023-11-12 ENCOUNTER — Encounter: Payer: Self-pay | Admitting: Family Medicine

## 2023-11-12 ENCOUNTER — Other Ambulatory Visit: Payer: Self-pay

## 2023-11-12 MED ORDER — ACCU-CHEK GUIDE W/DEVICE KIT
1.0000 | PACK | 0 refills | Status: DC | PRN
  Filled 2023-11-12: qty 1, 30d supply, fill #0

## 2023-11-12 MED ORDER — ACCU-CHEK SOFTCLIX LANCETS MISC
12 refills | Status: DC
  Filled 2023-11-12: qty 100, 34d supply, fill #0
  Filled 2023-12-15: qty 100, 34d supply, fill #1
  Filled 2024-06-19: qty 100, 34d supply, fill #2

## 2023-11-12 MED ORDER — ACCU-CHEK GUIDE TEST VI STRP
ORAL_STRIP | 12 refills | Status: DC
  Filled 2023-11-12: qty 100, 30d supply, fill #0
  Filled 2023-12-15: qty 100, 30d supply, fill #1
  Filled 2024-01-12: qty 100, 30d supply, fill #2
  Filled 2024-06-19: qty 100, 30d supply, fill #3

## 2023-11-13 ENCOUNTER — Other Ambulatory Visit: Payer: Self-pay

## 2023-11-13 NOTE — Telephone Encounter (Signed)
Please advise, patient unable to swallow metronidazole pills.

## 2023-11-14 ENCOUNTER — Other Ambulatory Visit: Payer: Self-pay

## 2023-11-14 ENCOUNTER — Other Ambulatory Visit (HOSPITAL_COMMUNITY): Payer: Self-pay

## 2023-11-14 MED ORDER — METRONIDAZOLE 0.75 % VA GEL
1.0000 | Freq: Every day | VAGINAL | 0 refills | Status: AC
  Filled 2023-11-14: qty 70, 7d supply, fill #0

## 2023-11-20 ENCOUNTER — Telehealth (HOSPITAL_COMMUNITY): Payer: Self-pay | Admitting: Mental Health

## 2023-11-20 ENCOUNTER — Ambulatory Visit (HOSPITAL_COMMUNITY): Payer: Medicaid Other | Admitting: Mental Health

## 2023-11-20 ENCOUNTER — Encounter (HOSPITAL_COMMUNITY): Payer: Self-pay

## 2023-11-20 NOTE — Telephone Encounter (Signed)
 Therapist sent link for tele-therapy session x 2. No response after x 10 minutes. Contacted pt via telephone, no answer. Left HIPAA compliant message and reminded of previously scheduled follow up appointment. NS

## 2023-12-02 ENCOUNTER — Ambulatory Visit: Payer: Medicaid Other | Attending: Family Medicine

## 2023-12-02 ENCOUNTER — Encounter: Payer: Self-pay | Admitting: *Deleted

## 2023-12-02 ENCOUNTER — Ambulatory Visit (HOSPITAL_BASED_OUTPATIENT_CLINIC_OR_DEPARTMENT_OTHER): Payer: Medicaid Other | Admitting: Maternal & Fetal Medicine

## 2023-12-02 ENCOUNTER — Ambulatory Visit: Payer: Medicaid Other | Admitting: *Deleted

## 2023-12-02 ENCOUNTER — Other Ambulatory Visit: Payer: Self-pay | Admitting: *Deleted

## 2023-12-02 VITALS — BP 111/58 | HR 70

## 2023-12-02 DIAGNOSIS — Z8759 Personal history of other complications of pregnancy, childbirth and the puerperium: Secondary | ICD-10-CM | POA: Diagnosis present

## 2023-12-02 DIAGNOSIS — O99210 Obesity complicating pregnancy, unspecified trimester: Secondary | ICD-10-CM | POA: Diagnosis present

## 2023-12-02 DIAGNOSIS — Z3A19 19 weeks gestation of pregnancy: Secondary | ICD-10-CM

## 2023-12-02 DIAGNOSIS — O24419 Gestational diabetes mellitus in pregnancy, unspecified control: Secondary | ICD-10-CM

## 2023-12-02 DIAGNOSIS — O09292 Supervision of pregnancy with other poor reproductive or obstetric history, second trimester: Secondary | ICD-10-CM

## 2023-12-02 DIAGNOSIS — O24410 Gestational diabetes mellitus in pregnancy, diet controlled: Secondary | ICD-10-CM | POA: Diagnosis not present

## 2023-12-02 DIAGNOSIS — F1991 Other psychoactive substance use, unspecified, in remission: Secondary | ICD-10-CM | POA: Insufficient documentation

## 2023-12-02 DIAGNOSIS — E669 Obesity, unspecified: Secondary | ICD-10-CM

## 2023-12-02 DIAGNOSIS — B009 Herpesviral infection, unspecified: Secondary | ICD-10-CM | POA: Diagnosis not present

## 2023-12-02 DIAGNOSIS — O0990 Supervision of high risk pregnancy, unspecified, unspecified trimester: Secondary | ICD-10-CM | POA: Diagnosis present

## 2023-12-02 DIAGNOSIS — O99342 Other mental disorders complicating pregnancy, second trimester: Secondary | ICD-10-CM

## 2023-12-02 DIAGNOSIS — O98512 Other viral diseases complicating pregnancy, second trimester: Secondary | ICD-10-CM | POA: Diagnosis not present

## 2023-12-02 DIAGNOSIS — O99212 Obesity complicating pregnancy, second trimester: Secondary | ICD-10-CM

## 2023-12-02 DIAGNOSIS — D563 Thalassemia minor: Secondary | ICD-10-CM

## 2023-12-02 DIAGNOSIS — F99 Mental disorder, not otherwise specified: Secondary | ICD-10-CM

## 2023-12-02 DIAGNOSIS — O99012 Anemia complicating pregnancy, second trimester: Secondary | ICD-10-CM
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
## 2023-12-10 ENCOUNTER — Ambulatory Visit: Payer: Medicaid Other | Admitting: Family Medicine

## 2023-12-10 VITALS — BP 97/67 | HR 85 | Wt 189.0 lb

## 2023-12-10 DIAGNOSIS — O0990 Supervision of high risk pregnancy, unspecified, unspecified trimester: Secondary | ICD-10-CM

## 2023-12-10 DIAGNOSIS — O24419 Gestational diabetes mellitus in pregnancy, unspecified control: Secondary | ICD-10-CM

## 2023-12-10 DIAGNOSIS — O99212 Obesity complicating pregnancy, second trimester: Secondary | ICD-10-CM

## 2023-12-10 DIAGNOSIS — O99210 Obesity complicating pregnancy, unspecified trimester: Secondary | ICD-10-CM

## 2023-12-10 DIAGNOSIS — Z3A20 20 weeks gestation of pregnancy: Secondary | ICD-10-CM

## 2023-12-10 DIAGNOSIS — Z8632 Personal history of gestational diabetes: Secondary | ICD-10-CM

## 2023-12-10 DIAGNOSIS — O0992 Supervision of high risk pregnancy, unspecified, second trimester: Secondary | ICD-10-CM

## 2023-12-10 DIAGNOSIS — B009 Herpesviral infection, unspecified: Secondary | ICD-10-CM

## 2023-12-10 NOTE — Progress Notes (Signed)
   PRENATAL VISIT NOTE  Subjective:  Janet Horn is a 23 y.o. G3P1101 at [redacted]w[redacted]d being seen today for ongoing prenatal care.  She is currently monitored for the following issues for this high-risk pregnancy and has MDD (major depressive disorder), recurrent, severe, with psychosis (HCC); PTSD (post-traumatic stress disorder); Adjustment disorder with mixed emotional features; History of drug use; HSV (herpes simplex virus) infection; History of gestational diabetes; Generalized anxiety disorder; Bipolar 2 disorder, major depressive episode (HCC); Prediabetes; Obesity during pregnancy, antepartum; Supervision of high risk pregnancy, antepartum; Hx IUFD at 35 wks; and Gestational diabetes mellitus (GDM) affecting pregnancy on their problem list.  Patient reports no bleeding, no contractions, no cramping, and no leaking.  Contractions: Not present. Vag. Bleeding: None.  Movement: Present. Denies leaking of fluid.   The following portions of the patient's history were reviewed and updated as appropriate: allergies, current medications, past family history, past medical history, past social history, past surgical history and problem list.   Objective:   Vitals:   12/10/23 1331  BP: 97/67  Pulse: 85  Weight: 189 lb (85.7 kg)    Fetal Status: Fetal Heart Rate (bpm): 145   Movement: Present     General:  Alert, oriented and cooperative. Patient is in no acute distress.  Skin: Skin is warm and dry. No rash noted.   Cardiovascular: Normal heart rate noted  Respiratory: Normal respiratory effort, no problems with respiration noted  Abdomen: Soft, gravid, appropriate for gestational age.  Pain/Pressure: Absent     Pelvic: Cervical exam deferred        Extremities: Normal range of motion.  Edema: Trace  Mental Status: Normal mood and affect. Normal behavior. Normal judgment and thought content.   Assessment and Plan:  Pregnancy: G3P1101 at [redacted]w[redacted]d 1. Supervision of high risk pregnancy,  antepartum (Primary) FHR and BP appropriate today Patient still desiring tubal ligation and will sign papers at future visit  2.  Gestational diabetes affecting pregnancy Patient with glucometer.  4/34 readings out of range.  Scheduled for follow-up growth scan.  Continue to monitor glucoses  3. Obesity during pregnancy, antepartum  4. HSV (herpes simplex virus) infection No current outbreak. Will need prophylaxis starting between 34 and 36 weeks  5. [redacted] weeks gestation of pregnancy   Preterm labor symptoms and general obstetric precautions including but not limited to vaginal bleeding, contractions, leaking of fluid and fetal movement were reviewed in detail with the patient. Please refer to After Visit Summary for other counseling recommendations.   No follow-ups on file.  Future Appointments  Date Time Provider Department Center  01/01/2024  2:00 PM Myrtie Neither [facility] None  01/06/2024 10:15 AM [facility] NURSE [facility]  01/06/2024 10:30 AM [facility] US4 [facility]  01/07/2024  1:35 PM Celedonio Savage, MD [facility]  01/14/2024 10:00 AM Shanna Cisco, NP [facility] None  01/28/2024  8:20 AM [facility] LAB [facility]  01/28/2024  8:55 AM Celedonio Savage, MD [facility]  02/11/2024  2:55 PM Jemari Hallum, Cyndi Lennert, MD [facility]    Celedonio Savage, MD

## 2023-12-15 ENCOUNTER — Encounter: Payer: Self-pay | Admitting: Cardiology

## 2023-12-15 ENCOUNTER — Other Ambulatory Visit (HOSPITAL_COMMUNITY): Payer: Self-pay

## 2023-12-15 NOTE — Telephone Encounter (Signed)
 Patient identification verified by 2 forms. Shade Flood, RN     Tried calling patient, no answer. [email or web address].

## 2024-01-01 ENCOUNTER — Ambulatory Visit (HOSPITAL_COMMUNITY): Payer: Medicaid Other | Admitting: Mental Health

## 2024-01-01 DIAGNOSIS — F3181 Bipolar II disorder: Secondary | ICD-10-CM | POA: Diagnosis not present

## 2024-01-01 DIAGNOSIS — F4310 Post-traumatic stress disorder, unspecified: Secondary | ICD-10-CM

## 2024-01-01 DIAGNOSIS — F411 Generalized anxiety disorder: Secondary | ICD-10-CM
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
## 2024-01-06 ENCOUNTER — Ambulatory Visit: Payer: Medicaid Other | Attending: Maternal & Fetal Medicine

## 2024-01-06 ENCOUNTER — Other Ambulatory Visit: Payer: Self-pay | Admitting: Maternal & Fetal Medicine

## 2024-01-06 ENCOUNTER — Other Ambulatory Visit: Payer: Self-pay

## 2024-01-06 ENCOUNTER — Ambulatory Visit: Payer: Medicaid Other

## 2024-01-06 ENCOUNTER — Ambulatory Visit: Admitting: Maternal & Fetal Medicine

## 2024-01-06 VITALS — BP 118/63 | HR 73

## 2024-01-06 DIAGNOSIS — O98512 Other viral diseases complicating pregnancy, second trimester: Secondary | ICD-10-CM

## 2024-01-06 DIAGNOSIS — O365990 Maternal care for other known or suspected poor fetal growth, unspecified trimester, not applicable or unspecified: Secondary | ICD-10-CM

## 2024-01-06 DIAGNOSIS — Z3A24 24 weeks gestation of pregnancy: Secondary | ICD-10-CM | POA: Diagnosis not present

## 2024-01-06 DIAGNOSIS — Z362 Encounter for other antenatal screening follow-up: Secondary | ICD-10-CM | POA: Insufficient documentation

## 2024-01-06 DIAGNOSIS — Z148 Genetic carrier of other disease: Secondary | ICD-10-CM | POA: Insufficient documentation

## 2024-01-06 DIAGNOSIS — O24410 Gestational diabetes mellitus in pregnancy, diet controlled: Secondary | ICD-10-CM

## 2024-01-06 DIAGNOSIS — F319 Bipolar disorder, unspecified: Secondary | ICD-10-CM | POA: Insufficient documentation

## 2024-01-06 DIAGNOSIS — O0990 Supervision of high risk pregnancy, unspecified, unspecified trimester: Secondary | ICD-10-CM

## 2024-01-06 DIAGNOSIS — E669 Obesity, unspecified: Secondary | ICD-10-CM | POA: Diagnosis not present

## 2024-01-06 DIAGNOSIS — Z8759 Personal history of other complications of pregnancy, childbirth and the puerperium: Secondary | ICD-10-CM

## 2024-01-06 DIAGNOSIS — O99342 Other mental disorders complicating pregnancy, second trimester: Secondary | ICD-10-CM | POA: Diagnosis not present

## 2024-01-06 DIAGNOSIS — O365920 Maternal care for other known or suspected poor fetal growth, second trimester, not applicable or unspecified: Secondary | ICD-10-CM | POA: Diagnosis not present

## 2024-01-06 DIAGNOSIS — O99212 Obesity complicating pregnancy, second trimester: Secondary | ICD-10-CM

## 2024-01-06 DIAGNOSIS — O24419 Gestational diabetes mellitus in pregnancy, unspecified control: Secondary | ICD-10-CM

## 2024-01-06 DIAGNOSIS — B009 Herpesviral infection, unspecified: Secondary | ICD-10-CM

## 2024-01-06 DIAGNOSIS — O99210 Obesity complicating pregnancy, unspecified trimester: Secondary | ICD-10-CM

## 2024-01-06 DIAGNOSIS — F1991 Other psychoactive substance use, unspecified, in remission: Secondary | ICD-10-CM

## 2024-01-06 DIAGNOSIS — Q6689 Other  specified congenital deformities of feet: Secondary | ICD-10-CM
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
## 2024-01-07 ENCOUNTER — Ambulatory Visit (INDEPENDENT_AMBULATORY_CARE_PROVIDER_SITE_OTHER): Payer: 59 | Admitting: Family Medicine

## 2024-01-07 ENCOUNTER — Other Ambulatory Visit: Payer: Self-pay

## 2024-01-07 VITALS — BP 110/75 | HR 99 | Wt 188.5 lb

## 2024-01-07 DIAGNOSIS — Z3A24 24 weeks gestation of pregnancy: Secondary | ICD-10-CM

## 2024-01-07 DIAGNOSIS — O24410 Gestational diabetes mellitus in pregnancy, diet controlled: Secondary | ICD-10-CM

## 2024-01-07 DIAGNOSIS — O99210 Obesity complicating pregnancy, unspecified trimester: Secondary | ICD-10-CM | POA: Diagnosis not present

## 2024-01-07 DIAGNOSIS — O0990 Supervision of high risk pregnancy, unspecified, unspecified trimester: Secondary | ICD-10-CM

## 2024-01-07 DIAGNOSIS — O365920 Maternal care for other known or suspected poor fetal growth, second trimester, not applicable or unspecified: Secondary | ICD-10-CM

## 2024-01-07 DIAGNOSIS — B009 Herpesviral infection, unspecified: Secondary | ICD-10-CM | POA: Diagnosis not present

## 2024-01-07 DIAGNOSIS — O365990 Maternal care for other known or suspected poor fetal growth, unspecified trimester, not applicable or unspecified: Secondary | ICD-10-CM

## 2024-01-07 DIAGNOSIS — O24419 Gestational diabetes mellitus in pregnancy, unspecified control: Secondary | ICD-10-CM | POA: Diagnosis not present

## 2024-01-07 DIAGNOSIS — E669 Obesity, unspecified: Secondary | ICD-10-CM

## 2024-01-07 DIAGNOSIS — Z3009 Encounter for other general counseling and advice on contraception: Secondary | ICD-10-CM

## 2024-01-07 DIAGNOSIS — O99212 Obesity complicating pregnancy, second trimester: Secondary | ICD-10-CM

## 2024-01-07 DIAGNOSIS — O2642 Herpes gestationis, second trimester: Secondary | ICD-10-CM

## 2024-01-07 NOTE — Progress Notes (Signed)
   PRENATAL VISIT NOTE  Subjective:  Janet Horn is a 23 y.o. G3P1101 at [redacted]w[redacted]d being seen today for ongoing prenatal care.  She is currently monitored for the following issues for this high-risk pregnancy and has MDD (major depressive disorder), recurrent, severe, with psychosis (HCC); PTSD (post-traumatic stress disorder); Adjustment disorder with mixed emotional features; History of drug use; HSV (herpes simplex virus) infection; History of gestational diabetes; Generalized anxiety disorder; Bipolar 2 disorder, major depressive episode (HCC); Prediabetes; Obesity during pregnancy, antepartum; Supervision of high risk pregnancy, antepartum; Hx IUFD at 35 wks; and Gestational diabetes mellitus (GDM) affecting pregnancy on their problem list.  Patient reports no bleeding, no contractions, no cramping, and no leaking.  Contractions: Not present. Vag. Bleeding: None.  Movement: Present. Denies leaking of fluid.   The following portions of the patient's history were reviewed and updated as appropriate: allergies, current medications, past family history, past medical history, past social history, past surgical history and problem list.   Objective:   Vitals:   01/07/24 1333  BP: 110/75  Pulse: 99  Weight: 188 lb 8 oz (85.5 kg)    Fetal Status: Fetal Heart Rate (bpm): 146   Movement: Present     General:  Alert, oriented and cooperative. Patient is in no acute distress.  Skin: Skin is warm and dry. No rash noted.   Cardiovascular: Normal heart rate noted  Respiratory: Normal respiratory effort, no problems with respiration noted  Abdomen: Soft, gravid, appropriate for gestational age.  Pain/Pressure: Present     Pelvic: Cervical exam deferred        Extremities: Normal range of motion.  Edema: None  Mental Status: Normal mood and affect. Normal behavior. Normal judgment and thought content.   Assessment and Plan:  Pregnancy: G3P1101 at [redacted]w[redacted]d 1. Supervision of high risk  pregnancy, antepartum (Primary) FHR and BP appropriate today  2. Gestational diabetes mellitus (GDM) affecting pregnancy Patient reports she has not been checking her blood sugars regularly.  The when she has checked recently have been well-controlled.  Reports fastings in the 80s and most of her postprandials in the 100s to 110s.  Reports occasional over 120s.  Will bring log to next visit.  3. Obesity during pregnancy, antepartum  4. HSV (herpes simplex virus) infection Will need prophylaxis starting between 34 and 36 weeks  5. Poor fetal growth affecting management of mother, antepartum, single or unspecified fetus IUGR in the 5th percentile Following with MFM  6. Unwanted fertility Desires tubal ligation Paper signed today  7. [redacted] weeks gestation of pregnancy  Preterm labor symptoms and general obstetric precautions including but not limited to vaginal bleeding, contractions, leaking of fluid and fetal movement were reviewed in detail with the patient. Please refer to After Visit Summary for other counseling recommendations.   No follow-ups on file.  Future Appointments  Date Time Provider Department Center  01/14/2024 10:00 AM Shanna Cisco, NP [facility] None  01/28/2024  8:20 AM [facility]  01/28/2024  8:55 AM Celedonio Savage, MD [facility]  02/11/2024  2:55 PM Celedonio Savage, MD [facility]  02/23/2024  4:00 PM Myrtie [facility] None    Celedonio Savage, MD

## 2024-01-12 ENCOUNTER — Other Ambulatory Visit (HOSPITAL_COMMUNITY): Payer: Self-pay

## 2024-01-12 ENCOUNTER — Telehealth (HOSPITAL_COMMUNITY): Payer: Self-pay

## 2024-01-12 NOTE — Telephone Encounter (Signed)
 PT called this morning asking if both of her providers where here today because she needed them to sign paperwork. I explained to the PT that she is more than welcome to drop the paperwork off - PT stated she needed the paperwork signed today - I explained to the PT that we require 24-72 hours to allow providers to complete paperwork. PT then hung up

## 2024-01-14 ENCOUNTER — Other Ambulatory Visit (HOSPITAL_COMMUNITY): Payer: Self-pay

## 2024-01-14 ENCOUNTER — Other Ambulatory Visit: Payer: Self-pay

## 2024-01-14 ENCOUNTER — Encounter (HOSPITAL_COMMUNITY): Payer: Self-pay | Admitting: Psychiatry

## 2024-01-14 ENCOUNTER — Telehealth (INDEPENDENT_AMBULATORY_CARE_PROVIDER_SITE_OTHER): Payer: Medicaid Other | Admitting: Psychiatry

## 2024-01-14 DIAGNOSIS — F3181 Bipolar II disorder: Secondary | ICD-10-CM

## 2024-01-14 DIAGNOSIS — F411 Generalized anxiety disorder: Secondary | ICD-10-CM | POA: Diagnosis not present

## 2024-01-14 MED ORDER — ARIPIPRAZOLE 2 MG PO TABS
2.0000 mg | ORAL_TABLET | Freq: Every day | ORAL | 3 refills | Status: DC
  Filled 2024-01-14 – 2024-02-03 (×2): qty 30, 30d supply, fill #0

## 2024-01-14 MED ORDER — HYDROXYZINE PAMOATE 25 MG PO CAPS
25.0000 mg | ORAL_CAPSULE | Freq: Two times a day (BID) | ORAL | 3 refills | Status: DC | PRN
  Filled 2024-01-14: qty 60, 15d supply, fill #0
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
## 2024-01-15 ENCOUNTER — Other Ambulatory Visit: Payer: Self-pay

## 2024-01-15 ENCOUNTER — Other Ambulatory Visit (HOSPITAL_COMMUNITY): Payer: Self-pay

## 2024-01-16 ENCOUNTER — Other Ambulatory Visit (HOSPITAL_COMMUNITY): Payer: Self-pay

## 2024-01-20 ENCOUNTER — Other Ambulatory Visit: Payer: Self-pay | Admitting: Maternal & Fetal Medicine

## 2024-01-20 ENCOUNTER — Ambulatory Visit (HOSPITAL_BASED_OUTPATIENT_CLINIC_OR_DEPARTMENT_OTHER)

## 2024-01-20 ENCOUNTER — Encounter: Payer: Self-pay | Admitting: Maternal & Fetal Medicine

## 2024-01-20 ENCOUNTER — Telehealth (HOSPITAL_COMMUNITY): Payer: Self-pay

## 2024-01-20 ENCOUNTER — Ambulatory Visit: Attending: Maternal & Fetal Medicine | Admitting: Maternal & Fetal Medicine

## 2024-01-20 VITALS — BP 112/62 | HR 94

## 2024-01-20 DIAGNOSIS — B009 Herpesviral infection, unspecified: Secondary | ICD-10-CM | POA: Insufficient documentation

## 2024-01-20 DIAGNOSIS — O99342 Other mental disorders complicating pregnancy, second trimester: Secondary | ICD-10-CM | POA: Diagnosis not present

## 2024-01-20 DIAGNOSIS — O09292 Supervision of pregnancy with other poor reproductive or obstetric history, second trimester: Secondary | ICD-10-CM

## 2024-01-20 DIAGNOSIS — Z148 Genetic carrier of other disease: Secondary | ICD-10-CM | POA: Diagnosis not present

## 2024-01-20 DIAGNOSIS — O24410 Gestational diabetes mellitus in pregnancy, diet controlled: Secondary | ICD-10-CM | POA: Insufficient documentation

## 2024-01-20 DIAGNOSIS — O99212 Obesity complicating pregnancy, second trimester: Secondary | ICD-10-CM

## 2024-01-20 DIAGNOSIS — O365990 Maternal care for other known or suspected poor fetal growth, unspecified trimester, not applicable or unspecified: Secondary | ICD-10-CM

## 2024-01-20 DIAGNOSIS — O24419 Gestational diabetes mellitus in pregnancy, unspecified control: Secondary | ICD-10-CM

## 2024-01-20 DIAGNOSIS — O358XX0 Maternal care for other (suspected) fetal abnormality and damage, not applicable or unspecified: Secondary | ICD-10-CM | POA: Insufficient documentation

## 2024-01-20 DIAGNOSIS — O365920 Maternal care for other known or suspected poor fetal growth, second trimester, not applicable or unspecified: Secondary | ICD-10-CM | POA: Insufficient documentation

## 2024-01-20 DIAGNOSIS — Z3A26 26 weeks gestation of pregnancy: Secondary | ICD-10-CM

## 2024-01-20 DIAGNOSIS — O99210 Obesity complicating pregnancy, unspecified trimester: Secondary | ICD-10-CM

## 2024-01-20 DIAGNOSIS — O43102 Malformation of placenta, unspecified, second trimester: Secondary | ICD-10-CM | POA: Insufficient documentation

## 2024-01-20 DIAGNOSIS — O98512 Other viral diseases complicating pregnancy, second trimester: Secondary | ICD-10-CM | POA: Diagnosis not present

## 2024-01-20 DIAGNOSIS — Z362 Encounter for other antenatal screening follow-up: Secondary | ICD-10-CM | POA: Insufficient documentation

## 2024-01-20 DIAGNOSIS — O0990 Supervision of high risk pregnancy, unspecified, unspecified trimester: Secondary | ICD-10-CM

## 2024-01-20 DIAGNOSIS — E669 Obesity, unspecified: Secondary | ICD-10-CM

## 2024-01-20 NOTE — Telephone Encounter (Signed)
 Pts PA has been started  For ARIPiprazole (ABILIFY) 2 MG tablet.    PA has been approved from 01/20/2024- 01/19/2025   JNL, CMA

## 2024-01-23 ENCOUNTER — Encounter (HOSPITAL_COMMUNITY): Payer: Self-pay | Admitting: Obstetrics & Gynecology

## 2024-01-23 ENCOUNTER — Inpatient Hospital Stay (HOSPITAL_COMMUNITY)
Admission: AD | Admit: 2024-01-23 | Discharge: 2024-01-23 | Disposition: A | Discharge status: Home / Self Care | Attending: Obstetrics & Gynecology | Admitting: Obstetrics & Gynecology

## 2024-01-23 DIAGNOSIS — O4703 False labor before 37 completed weeks of gestation, third trimester: Secondary | ICD-10-CM | POA: Insufficient documentation

## 2024-01-23 DIAGNOSIS — Z3A26 26 weeks gestation of pregnancy: Secondary | ICD-10-CM | POA: Diagnosis not present

## 2024-01-23 DIAGNOSIS — K5900 Constipation, unspecified: Secondary | ICD-10-CM | POA: Diagnosis not present

## 2024-01-23 DIAGNOSIS — O99612 Diseases of the digestive system complicating pregnancy, second trimester: Secondary | ICD-10-CM | POA: Diagnosis not present

## 2024-01-23 DIAGNOSIS — Z0371 Encounter for suspected problem with amniotic cavity and membrane ruled out: Secondary | ICD-10-CM

## 2024-01-23 DIAGNOSIS — Z3689 Encounter for other specified antenatal screening: Secondary | ICD-10-CM

## 2024-01-23 LAB — WET PREP, GENITAL
Clue Cells Wet Prep HPF POC: NONE SEEN
Sperm: NONE SEEN
Trich, Wet Prep: NONE SEEN
WBC, Wet Prep HPF POC: >=10 — AB (ref <10)
Yeast Wet Prep HPF POC: NONE SEEN

## 2024-01-23 LAB — RUPTURE OF MEMBRANE (ROM)PLUS: Rom Plus: NEGATIVE

## 2024-01-23 MED ORDER — ACETAMINOPHEN 325 MG PO TABS
650.0000 mg | ORAL_TABLET | Freq: Once | ORAL | Status: DC
  Filled 2024-01-23: qty 2

## 2024-01-23 MED ORDER — FLEET ENEMA RE ENEM: 1.0000 | ENEMA | Freq: Once | RECTAL | Status: DC

## 2024-01-23 MED ORDER — CYCLOBENZAPRINE HCL 5 MG PO TABS
10.0000 mg | ORAL_TABLET | Freq: Once | ORAL | Status: AC
  Administered 2024-01-23: 10 mg via ORAL
  Filled 2024-01-23: qty 2

## 2024-01-23 NOTE — MAU Note (Signed)
..  Janet Horn is a 23 y.o. at [redacted]w[redacted]d here in MAU reporting: Here [facility]. Reports a large gush of fluid at 1900 and cramping that began right after.  Denies vaginal bleeding.  +FM  Pain score: 6/10 Vitals:   01/23/24 2033  BP: 116/79  Pulse: 100  Resp: 19  Temp: 98.2 F (36.8 C)  SpO2: 100%     FHT:161 Lab orders placed from triage: UA

## 2024-01-23 NOTE — MAU Note (Signed)
 Patient reports she does not think she needs the fleets enema. Provider made aware.
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
## 2024-01-26 LAB — GC/CHLAMYDIA PROBE AMP (~~LOC~~) NOT AT ARMC
Chlamydia: NEGATIVE
Comment: NEGATIVE
Comment: NORMAL
Neisseria Gonorrhea: NEGATIVE

## 2024-01-27 ENCOUNTER — Other Ambulatory Visit: Payer: Self-pay

## 2024-01-28 ENCOUNTER — Other Ambulatory Visit: Payer: Medicaid Other

## 2024-01-28 ENCOUNTER — Ambulatory Visit (INDEPENDENT_AMBULATORY_CARE_PROVIDER_SITE_OTHER): Payer: Medicaid Other | Admitting: Family Medicine

## 2024-01-28 VITALS — BP 95/62 | HR 82 | Wt 191.4 lb

## 2024-01-28 DIAGNOSIS — O99210 Obesity complicating pregnancy, unspecified trimester: Secondary | ICD-10-CM

## 2024-01-28 DIAGNOSIS — O0990 Supervision of high risk pregnancy, unspecified, unspecified trimester: Secondary | ICD-10-CM

## 2024-01-28 DIAGNOSIS — B009 Herpesviral infection, unspecified: Secondary | ICD-10-CM | POA: Diagnosis not present

## 2024-01-28 DIAGNOSIS — O365990 Maternal care for other known or suspected poor fetal growth, unspecified trimester, not applicable or unspecified: Secondary | ICD-10-CM

## 2024-01-28 DIAGNOSIS — O2642 Herpes gestationis, second trimester: Secondary | ICD-10-CM

## 2024-01-28 DIAGNOSIS — Z3009 Encounter for other general counseling and advice on contraception: Secondary | ICD-10-CM

## 2024-01-28 DIAGNOSIS — E669 Obesity, unspecified: Secondary | ICD-10-CM

## 2024-01-28 DIAGNOSIS — O24419 Gestational diabetes mellitus in pregnancy, unspecified control: Secondary | ICD-10-CM | POA: Diagnosis not present

## 2024-01-28 DIAGNOSIS — O99212 Obesity complicating pregnancy, second trimester: Secondary | ICD-10-CM

## 2024-01-28 DIAGNOSIS — O365920 Maternal care for other known or suspected poor fetal growth, second trimester, not applicable or unspecified: Secondary | ICD-10-CM

## 2024-01-28 DIAGNOSIS — Z3A27 27 weeks gestation of pregnancy: Secondary | ICD-10-CM

## 2024-01-28 NOTE — Progress Notes (Signed)
 PRENATAL VISIT NOTE  Subjective:  Janet Horn is a 23 y.o. G3P1101 at [redacted]w[redacted]d being seen today for ongoing prenatal care.  She is currently monitored for the following issues for this high-risk pregnancy and has MDD (major depressive disorder), recurrent, severe, with psychosis (HCC); PTSD (post-traumatic stress disorder); Adjustment disorder with mixed emotional features; History of drug use; HSV (herpes simplex virus) infection; History of gestational diabetes; Generalized anxiety disorder; Bipolar 2 disorder, major depressive episode (HCC); Prediabetes; Obesity during pregnancy, antepartum; Supervision of high risk pregnancy, antepartum; Hx IUFD at 35 wks; and Gestational diabetes mellitus (GDM) affecting pregnancy on their problem list.  Patient reports no bleeding, no contractions, no cramping, and no leaking.  Contractions: Irritability. Vag. Bleeding: None.  Movement: Present. Denies leaking of fluid.   The following portions of the patient's history were reviewed and updated as appropriate: allergies, current medications, past family history, past medical history, past social history, past surgical history and problem list.   Objective:   Vitals:   01/28/24 0906  BP: 95/62  Pulse: 82  Weight: 191 lb 7 oz (86.8 kg)    Fetal Status: Fetal Heart Rate (bpm): 141   Movement: Present     General:  Alert, oriented and cooperative. Patient is in no acute distress.  Skin: Skin is warm and dry. No rash noted.   Cardiovascular: Normal heart rate noted  Respiratory: Normal respiratory effort, no problems with respiration noted  Abdomen: Soft, gravid, appropriate for gestational age.  Pain/Pressure: Absent     Pelvic: Cervical exam deferred        Extremities: Normal range of motion.  Edema: None  Mental Status: Normal mood and affect. Normal behavior. Normal judgment and thought content.   Assessment and Plan:  Pregnancy: G3P1101 at [redacted]w[redacted]d 1. Supervision of high risk pregnancy,  antepartum (Primary) FHR BP appropriate today - CBC - HIV antibody (with reflex) - RPR  2. Gestational diabetes mellitus (GDM) affecting pregnancy Patient has been checking blood sugars.  Has them on her phone log.  Most of the blood sugars within normal limits.  She does have occasional postprandials which are high but overall doing well.  Provided glucose log which she will fill out and bring into next visit.  3. Obesity during pregnancy, antepartum  4. HSV (herpes simplex virus) infection Discussed prophylaxis between 34 and 36 weeks with the patient.  She reports that she will consider taking it but will most likely not take it.  5. Poor fetal growth affecting management of mother, antepartum, single or unspecified fetus IUGR in the 5th percentile Following with MFM Ultrasound scheduled for 4/21 to further monitor.  6. Unwanted fertility Tubal papers signed at last visit  7. [redacted] weeks gestation of pregnancy   Preterm labor symptoms and general obstetric precautions including but not limited to vaginal bleeding, contractions, leaking of fluid and fetal movement were reviewed in detail with the patient. Please refer to After Visit Summary for other counseling recommendations.   No follow-ups on file.  Future Appointments  Date Time Provider Department Center  02/02/2024 11:00 AM [facility]ER [facility]  02/02/2024 11:30 AM [facility] US6 [facility]  02/11/2024  2:55 PM Celedonio Savage, MD [facility]  02/23/2024  4:00 PM Myrtie Neither, [facility] None  02/25/2024  8:30 AM Shanna Cisco, NP [facility] None  02/25/2024  2:35 PM Federico Flake, MD [facility]  03/10/2024  2:55 PM Nobie Putnam, Cyndi Lennert, MD [facility]    Jonny Ruiz  Franchot Ion, MD

## 2024-01-29 LAB — HIV ANTIBODY (ROUTINE TESTING W REFLEX): HIV Screen 4th Generation wRfx: NONREACTIVE

## 2024-01-29 LAB — CBC
Hematocrit: 35.1 % (ref 34.0–46.6)
Hemoglobin: 11.3 g/dL (ref 11.1–15.9)
MCH: 24.7 pg — ABNORMAL LOW (ref 26.6–33.0)
MCHC: 32.2 g/dL (ref 31.5–35.7)
MCV: 77 fL — ABNORMAL LOW (ref 79–97)
Platelets: 304 10*3/uL (ref 150–450)
RBC: 4.57 x10E6/uL (ref 3.77–5.28)
RDW: 13.7 % (ref 11.7–15.4)
WBC: 10.8 10*3/uL (ref 3.4–10.8)

## 2024-01-29 LAB — RPR: RPR Ser Ql: NONREACTIVE

## 2024-02-02 ENCOUNTER — Ambulatory Visit

## 2024-02-03 ENCOUNTER — Other Ambulatory Visit: Payer: Self-pay

## 2024-02-03 ENCOUNTER — Other Ambulatory Visit (HOSPITAL_COMMUNITY): Payer: Self-pay

## 2024-02-06 NOTE — Progress Notes (Signed)
 After review, MFM consult with provider is not indicated for today  Kristi Petties, MD 02/06/2024 11:26 AM  [facility]

## 2024-02-07 ENCOUNTER — Encounter (HOSPITAL_COMMUNITY): Payer: Self-pay | Admitting: Obstetrics and Gynecology

## 2024-02-07 ENCOUNTER — Inpatient Hospital Stay (HOSPITAL_COMMUNITY)
Admission: AD | Admit: 2024-02-07 | Discharge: 2024-02-08 | Disposition: A | Discharge status: Home / Self Care | Attending: Obstetrics and Gynecology | Admitting: Obstetrics and Gynecology

## 2024-02-07 ENCOUNTER — Other Ambulatory Visit: Payer: Self-pay

## 2024-02-07 DIAGNOSIS — R079 Chest pain, unspecified: Secondary | ICD-10-CM

## 2024-02-07 DIAGNOSIS — O99343 Other mental disorders complicating pregnancy, third trimester: Secondary | ICD-10-CM | POA: Insufficient documentation

## 2024-02-07 DIAGNOSIS — F410 Panic disorder [episodic paroxysmal anxiety] without agoraphobia: Secondary | ICD-10-CM

## 2024-02-07 DIAGNOSIS — O26899 Other specified pregnancy related conditions, unspecified trimester: Secondary | ICD-10-CM

## 2024-02-07 DIAGNOSIS — O26893 Other specified pregnancy related conditions, third trimester: Secondary | ICD-10-CM | POA: Insufficient documentation

## 2024-02-07 DIAGNOSIS — Z3A28 28 weeks gestation of pregnancy: Secondary | ICD-10-CM

## 2024-02-07 DIAGNOSIS — R109 Unspecified abdominal pain: Secondary | ICD-10-CM | POA: Insufficient documentation

## 2024-02-07 NOTE — Progress Notes (Addendum)
 Patient presents to MAU 129 with complaints of pelvic/abdominal pain that she is rating a 10/10. This pain is located in the lower left pelvis/abdomen. Patient states that pain is constant but also complains of tightening that radiates from the left side of her lower abdomen to the middle/lower part of her abdomen.Patient had recent intercourse today around 1300 and pain started around 2150. Patient took tylenol  for the pain around 2200 and the pain was unresolved. Patient denies any leaking of fluid, bleeding, vaginal discharge, and painful urination.

## 2024-02-08 ENCOUNTER — Inpatient Hospital Stay (HOSPITAL_COMMUNITY)

## 2024-02-08 DIAGNOSIS — O26893 Other specified pregnancy related conditions, third trimester: Secondary | ICD-10-CM | POA: Diagnosis not present

## 2024-02-08 DIAGNOSIS — R109 Unspecified abdominal pain: Secondary | ICD-10-CM

## 2024-02-08 DIAGNOSIS — F410 Panic disorder [episodic paroxysmal anxiety] without agoraphobia: Secondary | ICD-10-CM

## 2024-02-08 DIAGNOSIS — R079 Chest pain, unspecified: Secondary | ICD-10-CM | POA: Diagnosis not present

## 2024-02-08 DIAGNOSIS — O99343 Other mental disorders complicating pregnancy, third trimester: Secondary | ICD-10-CM | POA: Diagnosis not present

## 2024-02-08 DIAGNOSIS — Z3A28 28 weeks gestation of pregnancy: Secondary | ICD-10-CM

## 2024-02-08 LAB — WET PREP, GENITAL
Clue Cells Wet Prep HPF POC: NONE SEEN
Sperm: NONE SEEN
Trich, Wet Prep: NONE SEEN
WBC, Wet Prep HPF POC: >=10 — AB (ref <10)
Yeast Wet Prep HPF POC: NONE SEEN

## 2024-02-08 LAB — COMPREHENSIVE METABOLIC PANEL WITH GFR
ALT: 13 U/L (ref 0–44)
AST: 14 U/L — ABNORMAL LOW (ref 15–41)
Albumin: 2.7 g/dL — ABNORMAL LOW (ref 3.5–5.0)
Alkaline Phosphatase: 100 U/L (ref 38–126)
Anion gap: 9 (ref 5–15)
BUN: <5 mg/dL — ABNORMAL LOW (ref 6–20)
CO2: 22 mmol/L (ref 22–32)
Calcium: 8.4 mg/dL — ABNORMAL LOW (ref 8.9–10.3)
Chloride: 105 mmol/L (ref 98–111)
Creatinine, Ser: 0.59 mg/dL (ref 0.44–1.00)
GFR, Estimated: >60 mL/min (ref >60)
Glucose, Bld: 111 mg/dL — ABNORMAL HIGH (ref 70–99)
Potassium: 3.5 mmol/L (ref 3.5–5.1)
Sodium: 136 mmol/L (ref 135–145)
Total Bilirubin: 0.4 mg/dL (ref 0.0–1.2)
Total Protein: 5.7 g/dL — ABNORMAL LOW (ref 6.5–8.1)

## 2024-02-08 LAB — CBC
HCT: 33.2 % — ABNORMAL LOW (ref 36.0–46.0)
Hemoglobin: 10.5 g/dL — ABNORMAL LOW (ref 12.0–15.0)
MCH: 24 pg — ABNORMAL LOW (ref 26.0–34.0)
MCHC: 31.6 g/dL (ref 30.0–36.0)
MCV: 75.8 fL — ABNORMAL LOW (ref 80.0–100.0)
Platelets: 277 10*3/uL (ref 150–400)
RBC: 4.38 MIL/uL (ref 3.87–5.11)
RDW: 13.6 % (ref 11.5–15.5)
WBC: 10.4 10*3/uL (ref 4.0–10.5)
nRBC: 0 % (ref 0.0–0.2)

## 2024-02-08 LAB — BRAIN NATRIURETIC PEPTIDE: B Natriuretic Peptide: 21.9 pg/mL (ref 0.0–100.0)

## 2024-02-08 MED ORDER — CYCLOBENZAPRINE HCL 10 MG PO TABS
10.0000 mg | ORAL_TABLET | Freq: Two times a day (BID) | ORAL | 0 refills | Status: AC | PRN
  Filled 2024-02-08: qty 20, 10d supply, fill #0

## 2024-02-08 MED ORDER — HYDROXYZINE HCL 25 MG PO TABS
25.0000 mg | ORAL_TABLET | Freq: Four times a day (QID) | ORAL | 0 refills | Status: DC
  Filled 2024-02-08: qty 12, 3d supply, fill #0

## 2024-02-08 MED ORDER — MORPHINE SULFATE (PF) 4 MG/ML IV SOLN
1.0000 mg | Freq: Once | INTRAVENOUS | Status: AC
  Administered 2024-02-08: 1 mg via INTRAVENOUS
  Filled 2024-02-08: qty 1

## 2024-02-08 MED ORDER — CYCLOBENZAPRINE HCL 5 MG PO TABS
10.0000 mg | ORAL_TABLET | Freq: Once | ORAL | Status: DC
  Filled 2024-02-08: qty 2

## 2024-02-08 MED ORDER — LACTATED RINGERS IV BOLUS
1000.0000 mL | Freq: Once | INTRAVENOUS | Status: AC
  Administered 2024-02-08: 1000 mL via INTRAVENOUS
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
## 2024-02-09 ENCOUNTER — Other Ambulatory Visit (HOSPITAL_COMMUNITY): Payer: Self-pay

## 2024-02-09 ENCOUNTER — Other Ambulatory Visit: Payer: Self-pay

## 2024-02-09 LAB — GC/CHLAMYDIA PROBE AMP (~~LOC~~) NOT AT ARMC
Chlamydia: NEGATIVE
Comment: NEGATIVE
Comment: NORMAL
Neisseria Gonorrhea: NEGATIVE

## 2024-02-11 ENCOUNTER — Ambulatory Visit (INDEPENDENT_AMBULATORY_CARE_PROVIDER_SITE_OTHER): Payer: Medicaid Other | Admitting: Family Medicine

## 2024-02-11 VITALS — BP 123/78 | HR 111 | Wt 193.4 lb

## 2024-02-11 DIAGNOSIS — O99213 Obesity complicating pregnancy, third trimester: Secondary | ICD-10-CM

## 2024-02-11 DIAGNOSIS — O24419 Gestational diabetes mellitus in pregnancy, unspecified control: Secondary | ICD-10-CM | POA: Diagnosis not present

## 2024-02-11 DIAGNOSIS — B009 Herpesviral infection, unspecified: Secondary | ICD-10-CM | POA: Diagnosis not present

## 2024-02-11 DIAGNOSIS — O2643 Herpes gestationis, third trimester: Secondary | ICD-10-CM

## 2024-02-11 DIAGNOSIS — Z3A29 29 weeks gestation of pregnancy: Secondary | ICD-10-CM

## 2024-02-11 DIAGNOSIS — O0990 Supervision of high risk pregnancy, unspecified, unspecified trimester: Secondary | ICD-10-CM | POA: Diagnosis not present

## 2024-02-11 DIAGNOSIS — O365930 Maternal care for other known or suspected poor fetal growth, third trimester, not applicable or unspecified: Secondary | ICD-10-CM

## 2024-02-11 DIAGNOSIS — O365990 Maternal care for other known or suspected poor fetal growth, unspecified trimester, not applicable or unspecified: Secondary | ICD-10-CM

## 2024-02-11 DIAGNOSIS — O99210 Obesity complicating pregnancy, unspecified trimester: Secondary | ICD-10-CM

## 2024-02-11 DIAGNOSIS — E669 Obesity, unspecified: Secondary | ICD-10-CM

## 2024-02-11 DIAGNOSIS — Z3009 Encounter for other general counseling and advice on contraception: Secondary | ICD-10-CM

## 2024-02-11 NOTE — Progress Notes (Signed)
   PRENATAL VISIT NOTE  Subjective:  Janet Horn is a 23 y.o. G3P1101 at [redacted]w[redacted]d being seen today for ongoing prenatal care.  She is currently monitored for the following issues for this high-risk pregnancy and has MDD (major depressive disorder), recurrent, severe, with psychosis (HCC); PTSD (post-traumatic stress disorder); Adjustment disorder with mixed emotional features; History of drug use; HSV (herpes simplex virus) infection; History of gestational diabetes; Generalized anxiety disorder; Bipolar 2 disorder, major depressive episode (HCC); Prediabetes; Obesity during pregnancy, antepartum; Supervision of high risk pregnancy, antepartum; Hx IUFD at 35 wks; Gestational diabetes mellitus (GDM) affecting pregnancy; Poor fetal growth, affecting management of mother, antepartum condition or complication; and Unwanted fertility on their problem list.  Patient reports no bleeding, no contractions, no cramping, and no leaking.  Contractions: Not present. Vag. Bleeding: None.  Movement: Present. Denies leaking of fluid.  Patient was seen in the MAU on 4/27 for contractions after intercourse.  Has not had any since then.  The following portions of the patient's history were reviewed and updated as appropriate: allergies, current medications, past family history, past medical history, past social history, past surgical history and problem list.   Objective:   Vitals:   02/11/24 1451  BP: 123/78  Pulse: (!) 111  Weight: 193 lb 6.4 oz (87.7 kg)    Fetal Status: Fetal Heart Rate (bpm): 147   Movement: Present     General:  Alert, oriented and cooperative. Patient is in no acute distress.  Skin: Skin is warm and dry. No rash noted.   Cardiovascular: Normal heart rate noted  Respiratory: Normal respiratory effort, no problems with respiration noted  Abdomen: Soft, gravid, appropriate for gestational age.  Pain/Pressure: Present     Pelvic: Cervical exam deferred        Extremities: Normal  range of motion.  Edema: Trace  Mental Status: Normal mood and affect. Normal behavior. Normal judgment and thought content.   Assessment and Plan:  Pregnancy: G3P1101 at [redacted]w[redacted]d 1. Supervision of high risk pregnancy, antepartum (Primary) FHR and BP appropriate today Continue routine prenatal care  2. Gestational diabetes mellitus (GDM) affecting pregnancy Blood glucose is well-controlled.  6 elevated blood glucoses with most being within normal limits, see chart above Scheduled for growth scan on 5/5 with maternal-fetal medicine  3. Obesity during pregnancy, antepartum  4. HSV (herpes simplex virus) infection Will need prophylaxis between 34 and 36 weeks.  Patient reports she is not going to take the prophylaxis.  5. Poor fetal growth affecting management of mother, antepartum, single or unspecified fetus IUGR in the 50% on the most recent ultrasound. Growth ultrasound 5/5  6. Unwanted fertility Request tubal ligation Tubal papers previously signed  7. [redacted] weeks gestation of pregnancy   Preterm labor symptoms and general obstetric precautions including but not limited to vaginal bleeding, contractions, leaking of fluid and fetal movement were reviewed in detail with the patient. Please refer to After Visit Summary for other counseling recommendations.   No follow-ups on file.  Future Appointments  Date Time Provider Department Center  02/16/2024  7:00 AM [facility] PROVIDER 1 [facility]  02/16/2024  7:30 AM [facility] US3 [facility]  02/23/2024  4:00 PM Coni Deep, [facility] None  02/25/2024  8:30 AM Arlyne Bering, NP [facility] None  02/25/2024  2:35 PM Abner Ables, MD [facility]  03/10/2024  2:55 PM Randel Buss, Mardee Shackle, MD [facility]    Ferdie Housekeeper, MD

## 2024-02-13 ENCOUNTER — Other Ambulatory Visit: Payer: Self-pay | Admitting: *Deleted

## 2024-02-13 DIAGNOSIS — O365920 Maternal care for other known or suspected poor fetal growth, second trimester, not applicable or unspecified: Secondary | ICD-10-CM

## 2024-02-13 DIAGNOSIS — O24419 Gestational diabetes mellitus in pregnancy, unspecified control: Secondary | ICD-10-CM

## 2024-02-16 ENCOUNTER — Encounter: Payer: Self-pay | Admitting: Family Medicine

## 2024-02-16 ENCOUNTER — Other Ambulatory Visit: Payer: Self-pay | Admitting: *Deleted

## 2024-02-16 ENCOUNTER — Ambulatory Visit: Attending: Maternal & Fetal Medicine | Admitting: Maternal & Fetal Medicine

## 2024-02-16 ENCOUNTER — Ambulatory Visit (HOSPITAL_BASED_OUTPATIENT_CLINIC_OR_DEPARTMENT_OTHER)

## 2024-02-16 VITALS — BP 115/63

## 2024-02-16 DIAGNOSIS — O365990 Maternal care for other known or suspected poor fetal growth, unspecified trimester, not applicable or unspecified: Secondary | ICD-10-CM

## 2024-02-16 DIAGNOSIS — O365920 Maternal care for other known or suspected poor fetal growth, second trimester, not applicable or unspecified: Secondary | ICD-10-CM

## 2024-02-16 DIAGNOSIS — O98513 Other viral diseases complicating pregnancy, third trimester: Secondary | ICD-10-CM | POA: Diagnosis not present

## 2024-02-16 DIAGNOSIS — O358XX0 Maternal care for other (suspected) fetal abnormality and damage, not applicable or unspecified: Secondary | ICD-10-CM | POA: Diagnosis not present

## 2024-02-16 DIAGNOSIS — O24419 Gestational diabetes mellitus in pregnancy, unspecified control: Secondary | ICD-10-CM | POA: Diagnosis present

## 2024-02-16 DIAGNOSIS — B009 Herpesviral infection, unspecified: Secondary | ICD-10-CM

## 2024-02-16 DIAGNOSIS — O24410 Gestational diabetes mellitus in pregnancy, diet controlled: Secondary | ICD-10-CM | POA: Diagnosis not present

## 2024-02-16 DIAGNOSIS — Z3A29 29 weeks gestation of pregnancy: Secondary | ICD-10-CM | POA: Diagnosis not present

## 2024-02-16 DIAGNOSIS — Z148 Genetic carrier of other disease: Secondary | ICD-10-CM | POA: Diagnosis not present

## 2024-02-16 DIAGNOSIS — Z8759 Personal history of other complications of pregnancy, childbirth and the puerperium: Secondary | ICD-10-CM

## 2024-02-16 DIAGNOSIS — O43103 Malformation of placenta, unspecified, third trimester: Secondary | ICD-10-CM | POA: Diagnosis not present

## 2024-02-16 DIAGNOSIS — O365930 Maternal care for other known or suspected poor fetal growth, third trimester, not applicable or unspecified: Secondary | ICD-10-CM

## 2024-02-16 DIAGNOSIS — O99213 Obesity complicating pregnancy, third trimester: Secondary | ICD-10-CM | POA: Insufficient documentation

## 2024-02-16 DIAGNOSIS — O99343 Other mental disorders complicating pregnancy, third trimester: Secondary | ICD-10-CM | POA: Insufficient documentation

## 2024-02-16 DIAGNOSIS — F3181 Bipolar II disorder: Secondary | ICD-10-CM | POA: Insufficient documentation

## 2024-02-16 DIAGNOSIS — Z362 Encounter for other antenatal screening follow-up: Secondary | ICD-10-CM | POA: Diagnosis not present

## 2024-02-16 DIAGNOSIS — O09293 Supervision of pregnancy with other poor reproductive or obstetric history, third trimester: Secondary | ICD-10-CM | POA: Insufficient documentation

## 2024-02-16 DIAGNOSIS — Z8632 Personal history of gestational diabetes: Secondary | ICD-10-CM

## 2024-02-16 DIAGNOSIS — E669 Obesity, unspecified: Secondary | ICD-10-CM | POA: Diagnosis not present

## 2024-02-16 DIAGNOSIS — O35HXX0 Maternal care for other (suspected) fetal abnormality and damage, fetal lower extremities anomalies, not applicable or unspecified: Secondary | ICD-10-CM

## 2024-02-16 DIAGNOSIS — O99210 Obesity complicating pregnancy, unspecified trimester: Secondary | ICD-10-CM

## 2024-02-16 DIAGNOSIS — O0990 Supervision of high risk pregnancy, unspecified, unspecified trimester: Secondary | ICD-10-CM

## 2024-02-16 NOTE — Progress Notes (Signed)
 Patient information  Patient Name: Janet Horn [facility]  Patient MRN:   [id number]  Referring practice: MFM Referring Provider: [facility])  MFM CONSULT  Janet Horn is a 23 y.o. G3P1101 at [redacted]w[redacted]d here for ultrasound and consultation. Patient Active Problem List   Diagnosis Date Noted   Poor fetal growth, affecting management of mother, antepartum condition or complication 01/28/2024   Unwanted fertility 01/28/2024   Hx IUFD at 35 wks 12/02/2023   Gestational diabetes mellitus (GDM) affecting pregnancy 12/02/2023   Supervision of high risk pregnancy, antepartum 10/14/2023   Prediabetes 10/13/2023   Obesity during pregnancy, antepartum 10/13/2023   Bipolar 2 disorder, major depressive episode (HCC) 02/03/2023   Generalized anxiety disorder 11/29/2022   History of gestational diabetes 04/30/2022   HSV (herpes simplex virus) infection 12/24/2021   History of drug use 12/12/2021   Adjustment disorder with mixed emotional features 05/19/2021   MDD (major depressive disorder), recurrent, severe, 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   PTSD (post-traumatic stress disorder) 11/28/2016    Janet Horn Janet Horn is doing well today with no acute concerns.  RE poor fetal growth: Previous growth ultrasound showed the estimated fetal weight was consistent with possible fetal growth restriction.  Today the biometry is normal.  However, due to her history of poor fetal growth this pregnancy antenatal testing can start around 32 weeks.  A follow-up growth ultrasound in 4 weeks to be done to confirm the growth pattern.  Sonographic findings Single intrauterine pregnancy at 29w 6d.  Fetal cardiac activity:  Observed and appears normal. Presentation: Breech. Interval fetal anatomy appears normal. Fetal biometry shows the estimated fetal weight at the 16 percentile. Amniotic fluid volume: Within normal limits. MVP: 4.32 cm. Placenta: Posterior.  There are  limitations of prenatal ultrasound such as the inability to detect certain abnormalities due to poor visualization. Various factors such as fetal position, gestational age and maternal body habitus may increase the difficulty in visualizing the fetal anatomy.    Recommendations -Antenatal testing to start around 32 weeks  -Growth US  in 4 weeks.     Review of Systems: A review of systems was performed and was negative except per HPI   Vitals and Physical Exam    02/16/2024    7:04 AM 02/11/2024    2:51 PM 02/08/2024    2:56 AM  Vitals with BMI  Weight  193 lbs 6 oz   BMI  35.36   Systolic 115 123 91  Diastolic 63 78 57  Pulse  111 474    Sitting comfortably on the sonogram table Nonlabored breathing Normal rate and rhythm Abdomen is nontender  Past pregnancies OB History  Gravida Para Term Preterm AB Living  3 2 1 1  0 1  SAB IAB Ectopic Multiple Live Births  0   0 1    # Outcome Date GA Lbr Len/2nd Weight Sex Type Anes PTL Lv  3 Current           2 Term 07/05/22 [redacted]w[redacted]d 09:19 / 00:45 7 lb 8.3 oz (3.41 kg) M Vag-Spont EPI  LIV  1 Preterm  [redacted]w[redacted]d       FD    Obstetric Comments  Had fetal demise and D&C for first preg     I spent 20 minutes reviewing the patients chart, including labs and images as well as counseling the patient about her medical conditions. Greater than 50% of the time was spent in direct face-to-face patient counseling.  [employer]  Blondell Laperle  MFM, [place]   02/16/2024  4:33 PM

## 2024-02-18 ENCOUNTER — Other Ambulatory Visit (HOSPITAL_COMMUNITY): Payer: Self-pay

## 2024-02-21 ENCOUNTER — Inpatient Hospital Stay (HOSPITAL_COMMUNITY)
Admission: AD | Admit: 2024-02-21 | Discharge: 2024-02-21 | Disposition: A | Discharge status: Home / Self Care | Attending: Obstetrics and Gynecology | Admitting: Obstetrics and Gynecology

## 2024-02-21 ENCOUNTER — Encounter (HOSPITAL_COMMUNITY): Payer: Self-pay | Admitting: Obstetrics and Gynecology

## 2024-02-21 DIAGNOSIS — O26893 Other specified pregnancy related conditions, third trimester: Secondary | ICD-10-CM

## 2024-02-21 DIAGNOSIS — M7989 Other specified soft tissue disorders: Secondary | ICD-10-CM | POA: Insufficient documentation

## 2024-02-21 DIAGNOSIS — Z3689 Encounter for other specified antenatal screening: Secondary | ICD-10-CM

## 2024-02-21 DIAGNOSIS — Z3A30 30 weeks gestation of pregnancy: Secondary | ICD-10-CM

## 2024-02-21 DIAGNOSIS — R519 Headache, unspecified: Secondary | ICD-10-CM | POA: Diagnosis not present

## 2024-02-21 DIAGNOSIS — O99210 Obesity complicating pregnancy, unspecified trimester: Secondary | ICD-10-CM

## 2024-02-21 DIAGNOSIS — O0990 Supervision of high risk pregnancy, unspecified, unspecified trimester: Secondary | ICD-10-CM

## 2024-02-21 LAB — URINALYSIS, ROUTINE W REFLEX MICROSCOPIC
Bilirubin Urine: NEGATIVE
Glucose, UA: NEGATIVE mg/dL
Hgb urine dipstick: NEGATIVE
Ketones, ur: NEGATIVE mg/dL
Nitrite: NEGATIVE
Protein, ur: NEGATIVE mg/dL
Specific Gravity, Urine: 1.011 (ref 1.005–1.030)
pH: 7 (ref 5.0–8.0)

## 2024-02-21 MED ORDER — CYCLOBENZAPRINE HCL 5 MG PO TABS
10.0000 mg | ORAL_TABLET | Freq: Once | ORAL | Status: AC
  Administered 2024-02-21: 10 mg via ORAL
  Filled 2024-02-21: qty 2

## 2024-02-21 MED ORDER — CAFFEINE 200 MG PO TABS
200.0000 mg | ORAL_TABLET | Freq: Once | ORAL | Status: AC
  Administered 2024-02-21: 200 mg via ORAL
  Filled 2024-02-21: qty 1

## 2024-02-21 NOTE — MAU Note (Signed)
..  Janet Horn is a 23 y.o. at [redacted]w[redacted]d here in MAU reporting: migraine HA for the last 4 days. Has tried tylenol , drinking water, and resting without improvement. Last took 1000 mg of tylenol  at 1600 and 1000mg  this morning at 0800 today.   Also feeling dizzy, with spots in vision. Reports increased swelling in feet and ankles starting two nights ago.   Denies ctx, leaking fluid, vaginal bleeding. Endorses +FM.   Pain score: 8/10 HA Vitals:   02/21/24 1651  BP: (!) 121/59  Pulse: 100  Resp: 18  Temp: 98 F (36.7 C)  SpO2: 99%     FHT:145 Lab orders placed from triage:

## 2024-02-21 NOTE — MAU Provider Note (Signed)
 Event Date/Time   First Provider Initiated Contact with Patient 02/21/24 1738     S [name] is a 23 y.o. G25P1101 pregnant female at [redacted]w[redacted]d who presents to MAU today with complaint of a migraine headache x4 days. She has tried hydration, rest and tylenol  repeatedly with last 1000mg  dose taken at 1600 today. No associated n/v but is having some dizziness with spots and swelling in her feet/ankles. Feeling vigorous fetal movement, no other physical complaints.  Receives care at [facility]. Prenatal records reviewed.  Pertinent items noted in HPI and remainder of comprehensive ROS otherwise negative.   O BP 116/68   Pulse 88   Temp 98 F (36.7 C) (Oral)   Resp 16   Ht 5\' 2"  (1.575 m)   Wt 194 lb 8 oz (88.2 kg)   LMP  (LMP Unknown) Comment: Nexplanon  removed ? sometime in Oct, never had period  SpO2 98%   BMI 35.57 kg/m  Physical Exam Vitals and nursing note reviewed.  Constitutional:      General: She is not in acute distress.    Appearance: She is well-developed. She is obese. She is not ill-appearing.  Eyes:     Extraocular Movements: Extraocular movements intact.     Pupils: Pupils are equal, round, and reactive to light.  Cardiovascular:     Rate and Rhythm: Normal rate and regular rhythm.  Pulmonary:     Effort: Pulmonary effort is normal.  Musculoskeletal:        General: Normal range of motion.     Cervical back: Normal range of motion.     Right lower leg: No edema.     Left lower leg: No edema.     Comments: Trace edema at most, says it gets worse when walking then goes away)  Skin:    General: Skin is warm.     Capillary Refill: Capillary refill takes less than 2 seconds.  Neurological:     Mental Status: She is alert and oriented to person, place, and time.  Psychiatric:        Mood and Affect: Mood normal.        Behavior: Behavior normal.    Fetal Tracing: reactive Baseline: 145 Variability: moderate Accelerations: 15x15 Decelerations:  none Toco: some UI, otherwise relaxed   Results for orders placed or performed during the hospital encounter of 02/21/24 (from the past 24 hours)  Urinalysis, Routine w reflex microscopic -Urine, Clean Catch     Status: Abnormal   Collection Time: 02/21/24  5:38 PM  Result Value Ref Range   Color, Urine YELLOW YELLOW   APPearance HAZY (A) CLEAR   Specific Gravity, Urine 1.011 1.005 - 1.030   pH 7.0 5.0 - 8.0   Glucose, UA NEGATIVE NEGATIVE mg/dL   Hgb urine dipstick NEGATIVE NEGATIVE   Bilirubin Urine NEGATIVE NEGATIVE   Ketones, ur NEGATIVE NEGATIVE mg/dL   Protein, ur NEGATIVE NEGATIVE mg/dL   Nitrite NEGATIVE NEGATIVE   Leukocytes,Ua LARGE (A) NEGATIVE   RBC / HPF 0-5 0 - 5 RBC/hpf   WBC, UA 6-10 0 - 5 WBC/hpf   Bacteria, UA RARE (A) NONE SEEN   Squamous Epithelial / HPF 6-10 0 - 5 /HPF   Mucus PRESENT    Amorphous Crystal PRESENT    MDM: Low MAU Course: - Offered caffeine  and flexeril  since she'd recently taken tylenol , which relieved her headache down to a 5/10. (Also offered IVF but she said they made her feel SOB with coughing last  time so did not repeat. Encouraged her to reschedule her appt with OB Cardio given that reaction and her history of a heart murmur.) - Declined additional meds but requested to eat, brought her a sandwich box which relieved her headache completely. Stable for discharge home.   A Pregnancy headache in third trimester [redacted] weeks gestation of pregnancy NST reactive  P Discharge from MAU in stable condition with 3rd trimester precautions Follow up at [facility] as scheduled for ongoing prenatal care  Future Appointments  Date Time Provider Department Center  02/23/2024  4:00 PM Coni Deep [facility] None  02/25/2024  8:30 AM Arlyne Bering, NP [facility] None  02/25/2024  2:35 PM Abner Ables, MD [facility]  03/01/2024 10:30 AM [facility] NURSE [facility]  03/01/2024 10:45 AM [facility] NST [facility]  03/09/2024 10:30 AM [facility]  NURSE [facility]  03/09/2024 10:45 AM [facility] NST [facility]  03/10/2024  2:55 PM Ferdie Housekeeper, MD [facility]  03/16/2024  8:00 AM [facility] PROVIDER 1 [facility]  03/16/2024  8:30 AM [facility] US5 [facility]   Allergies as of 02/21/2024       Reactions   Nitrous Oxide Other (See Comments)   "siezures," per pt        Medication List     STOP taking these medications    promethazine  12.5 MG tablet Commonly known as: PHENERGAN        TAKE these medications    Accu-Chek Guide Test test strip Generic drug: glucose blood Use as instructed   Accu-Chek Guide w/Device Kit Use as directed   Accu-Chek Softclix Lancets lancets Use as instructed   acetaminophen  500 MG tablet Commonly known as: TYLENOL  Take 1,000 mg by mouth every 6 (six) hours as needed.   ARIPiprazole  2 MG tablet Commonly known as: Abilify  Take 1 tablet (2 mg total) by mouth daily.   cyclobenzaprine  10 MG tablet Commonly known as: FLEXERIL  Take 1 tablet (10 mg total) by mouth 2 (two) times daily as needed for muscle spasms.   hydrOXYzine  25 MG capsule Commonly known as: Vistaril  Take 1-2 capsules (25-50 mg total) by mouth 2 (two) times daily as needed.   hydrOXYzine  25 MG tablet Commonly known as: ATARAX  Take 1 tablet (25 mg total) by mouth every 6 (six) hours.   multivitamin-prenatal 27-0.8 MG Tabs tablet Take 1 tablet by mouth daily at 12 noon.   VALTREX  PO Take by mouth in the morning and at bedtime.       Lemuel Quaker, CNM, MSN, IBCLC Certified Nurse Midwife, [facility]

## 2024-02-23 ENCOUNTER — Ambulatory Visit (INDEPENDENT_AMBULATORY_CARE_PROVIDER_SITE_OTHER): Admitting: Mental Health

## 2024-02-23 DIAGNOSIS — F3181 Bipolar II disorder: Secondary | ICD-10-CM

## 2024-02-23 DIAGNOSIS — F4310 Post-traumatic stress disorder, unspecified: Secondary | ICD-10-CM

## 2024-02-23 NOTE — Progress Notes (Unsigned)
   THERAPIST PROGRESS NOTE Virtual Visit via Video Note  I connected with Janet Horn on 02/23/2024 at  4:00 PM EDT by a video enabled telemedicine application and verified that I am speaking with the correct person using two identifiers.  Location: Patient: home address on file Provider: home office   I discussed the limitations of evaluation and management by telemedicine and the availability of in person appointments. The patient expressed understanding and agreed to proceed.   I discussed the assessment and treatment plan with the patient. The patient was provided an opportunity to ask questions and all were answered. The patient agreed with the plan and demonstrated an understanding of the instructions.   The patient was advised to call back or seek an in-person evaluation if the symptoms worsen or if the condition fails to improve as anticipated.  I provided 35 minutes of non-face-to-face time during this encounter.   Loman Risk, [facility]   Session Time: 3:31 pm ( 35 minutes)  Participation Level: Active  Behavioral Response: CasualAlertWNL  Type of Therapy: Individual Therapy  Treatment Goals addressed:  STG: Janet Kaylene will increase management of moods AEB daily medication management with a ability to engage in self-care and coping skills daily within the next 90 days   ProgressTowards Goals: Progressing  Interventions: CBT and Supportive  Summary:  Janet Kaylene Rosch is a 23 y.o. female who presents with dx of bipolar II and PTSD. Janet presents for session alert and oriented; mood and affect stable, WNL. Speech clear and coherent at normal rate and tone. Notes for moods to have been better and has restarted medications and current medication compliant. Notes to be getting ready to deliver as she has had some close calls even though she is [redacted] weeks pregnant, due in July but projected to give birth in June. Shares thoughts of being a mother of x  2 and son being formally diagnosed with Autism; and notes does not desire to have more children. Shares thoughts on relationship with mother and seeking validation from mother and hx of lacking relationship. Able to identify poor boundaries with mother and working to explore increasing ability to advocate for self when engaging with mother. Denies safety concerns; ongoing process with goals.    Suicidal/Homicidal: Nowithout intent/plan  Therapist Response: Therapist engaged Janet in tele-therapy session. Completed check in and assessed for current level of functioning; sxs management and level of stressors. Supported Janet in processing current concerns and processing through thoughts and feelings of having x 2 children. Explored level of support in her life and engagement in self-care and coping skills to manage daily stressors. Supported in processing hx of relationship with mother and explored working to explore what boundaries may need to be in place with mother. Reviewed session and provided follow up.  Plan: Return again in  x 6 weeks.  Diagnosis: Bipolar 2 disorder, major depressive episode (HCC)  PTSD (post-traumatic stress disorder)  Collaboration of Care: Other None  Patient/Guardian was advised Release of Information must be obtained prior to any record release in order to collaborate their care with an outside provider. Patient/Guardian was advised if they have not already done so to contact the registration department to sign all necessary forms in order for us  to release information regarding their care.   Consent: Patient/Guardian gives verbal consent for treatment and assignment of benefits for services provided during this visit. Patient/Guardian expressed understanding and agreed to proceed.   Carmel Chimes [place], [facility] 02/23/2024

## 2024-02-25 ENCOUNTER — Encounter (HOSPITAL_COMMUNITY): Payer: Self-pay | Admitting: Psychiatry

## 2024-02-25 ENCOUNTER — Other Ambulatory Visit (HOSPITAL_COMMUNITY): Payer: Self-pay

## 2024-02-25 ENCOUNTER — Ambulatory Visit (INDEPENDENT_AMBULATORY_CARE_PROVIDER_SITE_OTHER): Admitting: Family Medicine

## 2024-02-25 ENCOUNTER — Other Ambulatory Visit: Payer: Self-pay

## 2024-02-25 ENCOUNTER — Telehealth (HOSPITAL_COMMUNITY): Admitting: Psychiatry

## 2024-02-25 ENCOUNTER — Encounter: Payer: Self-pay | Admitting: Family Medicine

## 2024-02-25 VITALS — BP 109/73 | HR 102 | Wt 194.6 lb

## 2024-02-25 DIAGNOSIS — Z8632 Personal history of gestational diabetes: Secondary | ICD-10-CM

## 2024-02-25 DIAGNOSIS — O99213 Obesity complicating pregnancy, third trimester: Secondary | ICD-10-CM

## 2024-02-25 DIAGNOSIS — Z3A31 31 weeks gestation of pregnancy: Secondary | ICD-10-CM

## 2024-02-25 DIAGNOSIS — E669 Obesity, unspecified: Secondary | ICD-10-CM

## 2024-02-25 DIAGNOSIS — O365990 Maternal care for other known or suspected poor fetal growth, unspecified trimester, not applicable or unspecified: Secondary | ICD-10-CM

## 2024-02-25 DIAGNOSIS — F3181 Bipolar II disorder: Secondary | ICD-10-CM

## 2024-02-25 DIAGNOSIS — O365930 Maternal care for other known or suspected poor fetal growth, third trimester, not applicable or unspecified: Secondary | ICD-10-CM

## 2024-02-25 DIAGNOSIS — Z8759 Personal history of other complications of pregnancy, childbirth and the puerperium: Secondary | ICD-10-CM

## 2024-02-25 DIAGNOSIS — O0990 Supervision of high risk pregnancy, unspecified, unspecified trimester: Secondary | ICD-10-CM

## 2024-02-25 DIAGNOSIS — O359XX0 Maternal care for (suspected) fetal abnormality and damage, unspecified, not applicable or unspecified: Secondary | ICD-10-CM | POA: Insufficient documentation

## 2024-02-25 DIAGNOSIS — O99210 Obesity complicating pregnancy, unspecified trimester: Secondary | ICD-10-CM

## 2024-02-25 DIAGNOSIS — O24419 Gestational diabetes mellitus in pregnancy, unspecified control: Secondary | ICD-10-CM

## 2024-02-25 MED ORDER — ARIPIPRAZOLE 2 MG PO TABS
2.0000 mg | ORAL_TABLET | Freq: Every day | ORAL | 3 refills | Status: DC
  Filled 2024-02-25 – 2024-02-26 (×2): qty 30, 30d supply, fill #0

## 2024-02-25 NOTE — Progress Notes (Unsigned)
   PRENATAL VISIT NOTE  Subjective:  Janet Horn is a 23 y.o. G3P1101 at [redacted]w[redacted]d being seen today for ongoing prenatal care.  She is currently monitored for the following issues for this low-risk pregnancy and has MDD (major depressive disorder), recurrent, severe, with psychosis (HCC); PTSD (post-traumatic stress disorder); Adjustment disorder with mixed emotional features; History of drug use; HSV (herpes simplex virus) infection; History of gestational diabetes; Generalized anxiety disorder; Bipolar 2 disorder, major depressive episode (HCC); Prediabetes; Obesity during pregnancy, antepartum; Supervision of high risk pregnancy, antepartum; Hx IUFD at 35 wks; Gestational diabetes mellitus (GDM) affecting pregnancy; Poor fetal growth, affecting management of mother, antepartum condition or complication; and Unwanted fertility on their problem list.  Patient reports no complaints.  Contractions: Irritability. Vag. Bleeding: None.  Movement: Present. Denies leaking of fluid.   The following portions of the patient's history were reviewed and updated as appropriate: allergies, current medications, past family history, past medical history, past social history, past surgical history and problem list.   Objective:    Vitals:   02/25/24 1444  BP: 109/73  Pulse: (!) 102  Weight: 194 lb 9.6 oz (88.3 kg)    Fetal Status:  Fetal Heart Rate (bpm): 145   Movement: Present    General: Alert, oriented and cooperative. Patient is in no acute distress.  Skin: Skin is warm and dry. No rash noted.   Cardiovascular: Normal heart rate noted  Respiratory: Normal respiratory effort, no problems with respiration noted  Abdomen: Soft, gravid, appropriate for gestational age.  Pain/Pressure: Present     Pelvic: Cervical exam deferred        Extremities: Normal range of motion.  Edema: None  Mental Status: Normal mood and affect. Normal behavior. Normal judgment and thought content.   Assessment and  Plan:  Pregnancy: G3P1101 at [redacted]w[redacted]d 1. Supervision of high risk pregnancy, antepartum (Primary) ***  2. Poor fetal growth affecting management of mother, antepartum, single or unspecified fetus ***  3. History of gestational diabetes ***  4. Hx IUFD at 35 wks ***  5. Obesity during pregnancy, antepartum ***  6. Gestational diabetes mellitus (GDM) affecting pregnancy ***  7. Known fetal anomaly, antepartum, single or unspecified fetus ***   Preterm labor symptoms and general obstetric precautions including but not limited to vaginal bleeding, contractions, leaking of fluid and fetal movement were reviewed in detail with the patient. Please refer to After Visit Summary for other counseling recommendations.   No follow-ups on file.  Future Appointments  Date Time Provider Department Center  03/01/2024 10:30 AM [facility] NURSE [facility]  03/01/2024 10:45 AM [facility] NST [facility]  03/09/2024 10:30 AM [facility] NURSE [facility]  03/09/2024 10:45 AM [facility] NST [facility]  03/10/2024  2:55 PM Ferdie Housekeeper, MD [facility]  03/16/2024  8:00 AM [facility] PROVIDER 1 [facility]  03/16/2024  8:30 AM [facility] US5 [facility]  03/24/2024  3:15 PM Abner Ables, MD [facility]  03/31/2024  3:35 PM Abner Ables, MD [facility]  04/08/2024  2:55 PM Ebony Goldstein, MD [facility]  04/21/2024  4:00 PM Coni Deep, [facility] None  05/11/2024 10:00 AM [place], Lynnell Sato, PA [facility] None    Abner Ables, MD
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
## 2024-02-26 ENCOUNTER — Other Ambulatory Visit (HOSPITAL_COMMUNITY): Payer: Self-pay

## 2024-02-26 ENCOUNTER — Telehealth: Payer: Self-pay | Admitting: Family Medicine

## 2024-02-26 ENCOUNTER — Encounter (HOSPITAL_COMMUNITY): Payer: Self-pay | Admitting: Obstetrics & Gynecology

## 2024-02-26 ENCOUNTER — Other Ambulatory Visit: Payer: Self-pay

## 2024-02-26 ENCOUNTER — Inpatient Hospital Stay (HOSPITAL_COMMUNITY)
Admission: AD | Admit: 2024-02-26 | Discharge: 2024-02-26 | Disposition: A | Discharge status: Home / Self Care | Attending: Obstetrics & Gynecology | Admitting: Obstetrics & Gynecology

## 2024-02-26 DIAGNOSIS — Z3A31 31 weeks gestation of pregnancy: Secondary | ICD-10-CM

## 2024-02-26 DIAGNOSIS — O99013 Anemia complicating pregnancy, third trimester: Secondary | ICD-10-CM | POA: Insufficient documentation

## 2024-02-26 DIAGNOSIS — R519 Headache, unspecified: Secondary | ICD-10-CM | POA: Insufficient documentation

## 2024-02-26 DIAGNOSIS — F419 Anxiety disorder, unspecified: Secondary | ICD-10-CM | POA: Diagnosis not present

## 2024-02-26 DIAGNOSIS — F32A Depression, unspecified: Secondary | ICD-10-CM | POA: Diagnosis not present

## 2024-02-26 DIAGNOSIS — G44201 Tension-type headache, unspecified, intractable: Secondary | ICD-10-CM

## 2024-02-26 DIAGNOSIS — R42 Dizziness and giddiness: Secondary | ICD-10-CM

## 2024-02-26 DIAGNOSIS — O368130 Decreased fetal movements, third trimester, not applicable or unspecified: Secondary | ICD-10-CM | POA: Diagnosis present

## 2024-02-26 DIAGNOSIS — R000 Tachycardia, unspecified: Secondary | ICD-10-CM | POA: Diagnosis not present

## 2024-02-26 DIAGNOSIS — R531 Weakness: Secondary | ICD-10-CM

## 2024-02-26 DIAGNOSIS — O09293 Supervision of pregnancy with other poor reproductive or obstetric history, third trimester: Secondary | ICD-10-CM | POA: Insufficient documentation

## 2024-02-26 DIAGNOSIS — O26893 Other specified pregnancy related conditions, third trimester: Secondary | ICD-10-CM

## 2024-02-26 DIAGNOSIS — O99343 Other mental disorders complicating pregnancy, third trimester: Secondary | ICD-10-CM | POA: Diagnosis not present

## 2024-02-26 LAB — CBC
HCT: 32.6 % — ABNORMAL LOW (ref 36.0–46.0)
Hemoglobin: 10.3 g/dL — ABNORMAL LOW (ref 12.0–15.0)
MCH: 23.7 pg — ABNORMAL LOW (ref 26.0–34.0)
MCHC: 31.6 g/dL (ref 30.0–36.0)
MCV: 75.1 fL — ABNORMAL LOW (ref 80.0–100.0)
Platelets: 305 10*3/uL (ref 150–400)
RBC: 4.34 MIL/uL (ref 3.87–5.11)
RDW: 13.8 % (ref 11.5–15.5)
WBC: 9.9 10*3/uL (ref 4.0–10.5)
nRBC: 0 % (ref 0.0–0.2)

## 2024-02-26 LAB — URINALYSIS, ROUTINE W REFLEX MICROSCOPIC
Bilirubin Urine: NEGATIVE
Glucose, UA: NEGATIVE mg/dL
Hgb urine dipstick: NEGATIVE
Ketones, ur: NEGATIVE mg/dL
Nitrite: NEGATIVE
Protein, ur: NEGATIVE mg/dL
Specific Gravity, Urine: 1.009 (ref 1.005–1.030)
pH: 7 (ref 5.0–8.0)

## 2024-02-26 LAB — COMPREHENSIVE METABOLIC PANEL WITH GFR
ALT: 15 U/L (ref 0–44)
AST: 14 U/L — ABNORMAL LOW (ref 15–41)
Albumin: 2.6 g/dL — ABNORMAL LOW (ref 3.5–5.0)
Alkaline Phosphatase: 116 U/L (ref 38–126)
Anion gap: 8 (ref 5–15)
BUN: 5 mg/dL — ABNORMAL LOW (ref 6–20)
CO2: 21 mmol/L — ABNORMAL LOW (ref 22–32)
Calcium: 8.7 mg/dL — ABNORMAL LOW (ref 8.9–10.3)
Chloride: 107 mmol/L (ref 98–111)
Creatinine, Ser: 0.57 mg/dL (ref 0.44–1.00)
GFR, Estimated: >60 mL/min (ref >60)
Glucose, Bld: 108 mg/dL — ABNORMAL HIGH (ref 70–99)
Potassium: 3.7 mmol/L (ref 3.5–5.1)
Sodium: 136 mmol/L (ref 135–145)
Total Bilirubin: 0.4 mg/dL (ref 0.0–1.2)
Total Protein: 6 g/dL — ABNORMAL LOW (ref 6.5–8.1)

## 2024-02-26 MED ORDER — LACTATED RINGERS IV BOLUS: 1000.0000 mL | Freq: Once | INTRAVENOUS | Status: DC

## 2024-02-26 MED ORDER — ACETAMINOPHEN-CAFFEINE 500-65 MG PO TABS
2.0000 | ORAL_TABLET | Freq: Once | ORAL | Status: AC
  Administered 2024-02-26: 2 via ORAL
  Filled 2024-02-26: qty 2

## 2024-02-26 NOTE — MAU Note (Signed)
 MAU Triage Note:  .Janet Horn is a 23 y.o. at [redacted]w[redacted]d here in MAU reporting: arrived via EMS with complaints of headache, epigastric pain, dizziness, blurred vision, and fatigue that began around 0700 this morning. Her epigastric pain has gotten a lot better, but her headache has continued to be severe. She tried flexeril  around 1630 with no relief. Denies VB or LOF. Reports DFM since this morning also. Denies nausea at this time.  Patient complaint: Headahce, blurred vision, nausea  Pain Score: 10-Worst pain ever Pain Location: Head Pain Score: 1 Pain Location: Epigastric   PIH Assessment: Headache present: Yes ;Has not responded to treatment Visual disturbances: Blurred RUQ pain/Epigastric: Burning Atypical edema: None Hx of HBP: Patient denies BP Medications: None prescribed   Onset of complaint: today at 0700 LMP: No LMP recorded (lmp unknown). Patient is pregnant.  Vitals:   02/26/24 1745  BP: 112/61  Pulse: (!) 102  Resp: 16  Temp: 98.3 F (36.8 C)  SpO2: 98%    FHT:  Fetal Heart Rate Mode: External Baseline Rate (A): 145 bpm Multiple birth?: No Lab orders placed from triage: UA, FM clicker given
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
## 2024-03-01 ENCOUNTER — Ambulatory Visit: Attending: Maternal & Fetal Medicine | Admitting: *Deleted

## 2024-03-01 ENCOUNTER — Encounter: Payer: Self-pay | Admitting: *Deleted

## 2024-03-01 ENCOUNTER — Ambulatory Visit: Admitting: *Deleted

## 2024-03-01 VITALS — BP 108/59 | HR 104

## 2024-03-01 DIAGNOSIS — O24410 Gestational diabetes mellitus in pregnancy, diet controlled: Secondary | ICD-10-CM | POA: Insufficient documentation

## 2024-03-01 DIAGNOSIS — O99210 Obesity complicating pregnancy, unspecified trimester: Secondary | ICD-10-CM

## 2024-03-01 DIAGNOSIS — O09293 Supervision of pregnancy with other poor reproductive or obstetric history, third trimester: Secondary | ICD-10-CM

## 2024-03-01 DIAGNOSIS — O99213 Obesity complicating pregnancy, third trimester: Secondary | ICD-10-CM

## 2024-03-01 DIAGNOSIS — Z8759 Personal history of other complications of pregnancy, childbirth and the puerperium: Secondary | ICD-10-CM | POA: Insufficient documentation

## 2024-03-01 DIAGNOSIS — O0990 Supervision of high risk pregnancy, unspecified, unspecified trimester: Secondary | ICD-10-CM

## 2024-03-01 DIAGNOSIS — O24419 Gestational diabetes mellitus in pregnancy, unspecified control: Secondary | ICD-10-CM

## 2024-03-01 DIAGNOSIS — Z3A31 31 weeks gestation of pregnancy: Secondary | ICD-10-CM

## 2024-03-01 NOTE — Procedures (Signed)
 Janet Horn [birth year] [redacted]w[redacted]d  Fetus A Non-Stress Test Interpretation for 03/01/24  Indication: GDM - diet, Obesity, Hx: IUFD  Fetal Heart Rate A Mode: External Baseline Rate (A): 140 bpm Variability: Moderate Accelerations: 15 x 15 Decelerations: None Multiple birth?: No  Uterine Activity Mode: Toco Contraction Frequency (min): One UC Contraction Duration (sec): 60 Contraction Quality: Mild Resting Tone Palpated: Relaxed Resting Time: Adequate  Interpretation (Fetal Testing) Nonstress Test Interpretation: Reactive Comments: Tracing reviewed by Dr. Grayland Le

## 2024-03-04 ENCOUNTER — Encounter: Payer: Self-pay | Admitting: Family Medicine

## 2024-03-09 ENCOUNTER — Ambulatory Visit (HOSPITAL_BASED_OUTPATIENT_CLINIC_OR_DEPARTMENT_OTHER)

## 2024-03-09 ENCOUNTER — Ambulatory Visit: Attending: Obstetrics and Gynecology | Admitting: Obstetrics and Gynecology

## 2024-03-09 VITALS — BP 103/65 | HR 97

## 2024-03-09 DIAGNOSIS — O99210 Obesity complicating pregnancy, unspecified trimester: Secondary | ICD-10-CM

## 2024-03-09 DIAGNOSIS — O365930 Maternal care for other known or suspected poor fetal growth, third trimester, not applicable or unspecified: Secondary | ICD-10-CM | POA: Diagnosis not present

## 2024-03-09 DIAGNOSIS — O24415 Gestational diabetes mellitus in pregnancy, controlled by oral hypoglycemic drugs: Secondary | ICD-10-CM | POA: Insufficient documentation

## 2024-03-09 DIAGNOSIS — O359XX0 Maternal care for (suspected) fetal abnormality and damage, unspecified, not applicable or unspecified: Secondary | ICD-10-CM

## 2024-03-09 DIAGNOSIS — O24419 Gestational diabetes mellitus in pregnancy, unspecified control: Secondary | ICD-10-CM | POA: Diagnosis not present

## 2024-03-09 DIAGNOSIS — Z3A33 33 weeks gestation of pregnancy: Secondary | ICD-10-CM

## 2024-03-09 DIAGNOSIS — O24410 Gestational diabetes mellitus in pregnancy, diet controlled: Secondary | ICD-10-CM

## 2024-03-09 DIAGNOSIS — O99213 Obesity complicating pregnancy, third trimester: Secondary | ICD-10-CM | POA: Insufficient documentation

## 2024-03-09 DIAGNOSIS — O365990 Maternal care for other known or suspected poor fetal growth, unspecified trimester, not applicable or unspecified: Secondary | ICD-10-CM

## 2024-03-09 DIAGNOSIS — O0990 Supervision of high risk pregnancy, unspecified, unspecified trimester: Secondary | ICD-10-CM

## 2024-03-09 NOTE — Progress Notes (Signed)
 After review, MFM consult with provider is not indicated for today  Janet Clever, MD 03/09/2024 12:13 PM  [facility]

## 2024-03-09 NOTE — Procedures (Signed)
 Janet Horn [birth year] [redacted]w[redacted]d  Fetus A Non-Stress Test Interpretation for 03/09/24  Indication: Gestational Diabetes medication controlled - NST only  Fetal Heart Rate A Mode: External Baseline Rate (A): 140 bpm Variability: Moderate Accelerations: 15 x 15 Multiple birth?: No  Uterine Activity Mode: Toco, Palpation Contraction Frequency (min): none noted Resting Tone Palpated: Relaxed  Interpretation (Fetal Testing) Nonstress Test Interpretation: Reactive Comments: Reviewed with Dr. Arnie Bibber

## 2024-03-10 ENCOUNTER — Other Ambulatory Visit: Payer: Self-pay

## 2024-03-10 ENCOUNTER — Ambulatory Visit: Admitting: Family Medicine

## 2024-03-10 VITALS — BP 106/68 | HR 109 | Wt 196.0 lb

## 2024-03-10 DIAGNOSIS — O365930 Maternal care for other known or suspected poor fetal growth, third trimester, not applicable or unspecified: Secondary | ICD-10-CM

## 2024-03-10 DIAGNOSIS — Z3A33 33 weeks gestation of pregnancy: Secondary | ICD-10-CM

## 2024-03-10 DIAGNOSIS — Z8759 Personal history of other complications of pregnancy, childbirth and the puerperium: Secondary | ICD-10-CM

## 2024-03-10 DIAGNOSIS — Z8632 Personal history of gestational diabetes: Secondary | ICD-10-CM

## 2024-03-10 DIAGNOSIS — O0990 Supervision of high risk pregnancy, unspecified, unspecified trimester: Secondary | ICD-10-CM

## 2024-03-10 DIAGNOSIS — O359XX0 Maternal care for (suspected) fetal abnormality and damage, unspecified, not applicable or unspecified: Secondary | ICD-10-CM

## 2024-03-10 DIAGNOSIS — O365990 Maternal care for other known or suspected poor fetal growth, unspecified trimester, not applicable or unspecified: Secondary | ICD-10-CM

## 2024-03-10 DIAGNOSIS — B009 Herpesviral infection, unspecified: Secondary | ICD-10-CM

## 2024-03-10 DIAGNOSIS — O2643 Herpes gestationis, third trimester: Secondary | ICD-10-CM

## 2024-03-10 DIAGNOSIS — O24419 Gestational diabetes mellitus in pregnancy, unspecified control: Secondary | ICD-10-CM
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
## 2024-03-15 ENCOUNTER — Encounter: Payer: Self-pay | Admitting: Family Medicine

## 2024-03-16 ENCOUNTER — Other Ambulatory Visit: Payer: Self-pay | Admitting: *Deleted

## 2024-03-16 ENCOUNTER — Ambulatory Visit: Attending: Maternal & Fetal Medicine | Admitting: Obstetrics

## 2024-03-16 ENCOUNTER — Ambulatory Visit

## 2024-03-16 VITALS — BP 102/60 | HR 75

## 2024-03-16 DIAGNOSIS — O359XX0 Maternal care for (suspected) fetal abnormality and damage, unspecified, not applicable or unspecified: Secondary | ICD-10-CM

## 2024-03-16 DIAGNOSIS — O35HXX0 Maternal care for other (suspected) fetal abnormality and damage, fetal lower extremities anomalies, not applicable or unspecified: Secondary | ICD-10-CM | POA: Diagnosis not present

## 2024-03-16 DIAGNOSIS — O99213 Obesity complicating pregnancy, third trimester: Secondary | ICD-10-CM | POA: Diagnosis present

## 2024-03-16 DIAGNOSIS — O0990 Supervision of high risk pregnancy, unspecified, unspecified trimester: Secondary | ICD-10-CM

## 2024-03-16 DIAGNOSIS — O99210 Obesity complicating pregnancy, unspecified trimester: Secondary | ICD-10-CM

## 2024-03-16 DIAGNOSIS — Z148 Genetic carrier of other disease: Secondary | ICD-10-CM | POA: Insufficient documentation

## 2024-03-16 DIAGNOSIS — E669 Obesity, unspecified: Secondary | ICD-10-CM | POA: Diagnosis not present

## 2024-03-16 DIAGNOSIS — Z3A34 34 weeks gestation of pregnancy: Secondary | ICD-10-CM

## 2024-03-16 DIAGNOSIS — O358XX0 Maternal care for other (suspected) fetal abnormality and damage, not applicable or unspecified: Secondary | ICD-10-CM | POA: Diagnosis not present

## 2024-03-16 DIAGNOSIS — O365930 Maternal care for other known or suspected poor fetal growth, third trimester, not applicable or unspecified: Secondary | ICD-10-CM | POA: Insufficient documentation

## 2024-03-16 DIAGNOSIS — B009 Herpesviral infection, unspecified: Secondary | ICD-10-CM | POA: Insufficient documentation

## 2024-03-16 DIAGNOSIS — Z362 Encounter for other antenatal screening follow-up: Secondary | ICD-10-CM | POA: Insufficient documentation

## 2024-03-16 DIAGNOSIS — O98513 Other viral diseases complicating pregnancy, third trimester: Secondary | ICD-10-CM | POA: Insufficient documentation

## 2024-03-16 DIAGNOSIS — O24410 Gestational diabetes mellitus in pregnancy, diet controlled: Secondary | ICD-10-CM

## 2024-03-16 DIAGNOSIS — Z8759 Personal history of other complications of pregnancy, childbirth and the puerperium: Secondary | ICD-10-CM | POA: Diagnosis not present

## 2024-03-16 DIAGNOSIS — O09293 Supervision of pregnancy with other poor reproductive or obstetric history, third trimester: Secondary | ICD-10-CM | POA: Insufficient documentation

## 2024-03-16 DIAGNOSIS — O24419 Gestational diabetes mellitus in pregnancy, unspecified control: Secondary | ICD-10-CM

## 2024-03-16 DIAGNOSIS — O365990 Maternal care for other known or suspected poor fetal growth, unspecified trimester, not applicable or unspecified: Secondary | ICD-10-CM
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
## 2024-03-23 ENCOUNTER — Ambulatory Visit (HOSPITAL_BASED_OUTPATIENT_CLINIC_OR_DEPARTMENT_OTHER): Admitting: Obstetrics and Gynecology

## 2024-03-23 ENCOUNTER — Ambulatory Visit

## 2024-03-23 ENCOUNTER — Ambulatory Visit: Attending: Obstetrics and Gynecology

## 2024-03-23 VITALS — BP 107/64

## 2024-03-23 DIAGNOSIS — O24425 Gestational diabetes mellitus in childbirth, controlled by oral hypoglycemic drugs: Secondary | ICD-10-CM

## 2024-03-23 DIAGNOSIS — O321XX0 Maternal care for breech presentation, not applicable or unspecified: Secondary | ICD-10-CM | POA: Diagnosis not present

## 2024-03-23 DIAGNOSIS — O99210 Obesity complicating pregnancy, unspecified trimester: Secondary | ICD-10-CM

## 2024-03-23 DIAGNOSIS — O09293 Supervision of pregnancy with other poor reproductive or obstetric history, third trimester: Secondary | ICD-10-CM | POA: Diagnosis not present

## 2024-03-23 DIAGNOSIS — O24419 Gestational diabetes mellitus in pregnancy, unspecified control: Secondary | ICD-10-CM | POA: Diagnosis not present

## 2024-03-23 DIAGNOSIS — O359XX0 Maternal care for (suspected) fetal abnormality and damage, unspecified, not applicable or unspecified: Secondary | ICD-10-CM

## 2024-03-23 DIAGNOSIS — O0990 Supervision of high risk pregnancy, unspecified, unspecified trimester: Secondary | ICD-10-CM

## 2024-03-23 DIAGNOSIS — O365930 Maternal care for other known or suspected poor fetal growth, third trimester, not applicable or unspecified: Secondary | ICD-10-CM | POA: Insufficient documentation

## 2024-03-23 DIAGNOSIS — Z3A35 35 weeks gestation of pregnancy: Secondary | ICD-10-CM

## 2024-03-23 DIAGNOSIS — O365990 Maternal care for other known or suspected poor fetal growth, unspecified trimester, not applicable or unspecified: Secondary | ICD-10-CM

## 2024-03-23 NOTE — Procedures (Signed)
 Janet Horn [birth year] [redacted]w[redacted]d  Fetus A Non-Stress Test Interpretation for 03/23/24  Indication: Gestational diabetes, history of full-term IUFD  Fetal Heart Rate A Mode: External Baseline Rate (A): 140 bpm Variability: Moderate Accelerations: 15 x 15 Decelerations: None Multiple birth?: No  Uterine Activity Mode: Palpation, Toco Contraction Frequency (min): Irritability noted - pt reports not feeling Contraction Quality:  (non-palpable) Resting Tone Palpated: Relaxed  Interpretation (Fetal Testing) Nonstress Test Interpretation: Reactive Comments: Reviewed with Dr. Arnie Bibber

## 2024-03-23 NOTE — Progress Notes (Addendum)
 Maternal-Fetal Medicine Consultation Name: Janet Horn MRN: [id number]  G3 P1011 at 35-weeks' gestation. Patient is here for NST.  She has gestational diabetes that is reportedly well-controlled on diet.  Patient reports fasting and postprandial levels are within normal range except some post breakfast levels being higher. Obstetrical history significant for a term vaginal delivery of a healthy infant.  Before that pregnancy, she had a preterm stillbirth.  I performed a bedside ultrasound. Amniotic fluid is normal and good fetal heart activity is seen. AFI is 21 cm.  Breech presentation. Patient reports good fetal movements.  I reassured the patient of the findings. Reassuring Modified BPP is associated with a very low likelihood of stillbirth (0.8 per 1,000 births in one week).  Breech presentation and External Cephalic Version -I counseled the patient on malpresentation and that in most cases it will spontaneously revert to cephalic presentation.  -If spontaneous version does not occur by [redacted] weeks gestation, external cephalic version can be performed.  I briefly explained the procedure. Possible complication include nonreassuring fetal heart trace, rupture of membranes, fetomaternal hemorrhage, and abruption that may require emergency cesarean delivery (about 1%).  -Success rate of ECV is about 50%.  -ECV can be performed later at [redacted] weeks gestation under epidural analgesia.  If successful, the patient can await spontaneous labor or induction of labor can be carried out after the procedure.  I counseled the patient that ECV reduces the likelihood of cesarean delivery and its complications.  Patient's blood type is AB positive.  Recommendations -Check presentation at prenatal visits and offer ECV if breech presentation persists. Consultation including face-to-face (more than 50%) counseling 20 minutes.

## 2024-03-24 ENCOUNTER — Ambulatory Visit: Admitting: Family Medicine

## 2024-03-24 ENCOUNTER — Other Ambulatory Visit (HOSPITAL_COMMUNITY)
Admission: RE | Admit: 2024-03-24 | Discharge: 2024-03-24 | Disposition: A | Discharge status: Home / Self Care | Source: Ambulatory Visit | Attending: Family Medicine | Admitting: Family Medicine

## 2024-03-24 ENCOUNTER — Other Ambulatory Visit: Payer: Self-pay

## 2024-03-24 ENCOUNTER — Encounter: Payer: Self-pay | Admitting: Family Medicine

## 2024-03-24 VITALS — BP 114/75 | HR 101 | Wt 198.3 lb

## 2024-03-24 DIAGNOSIS — N898 Other specified noninflammatory disorders of vagina: Secondary | ICD-10-CM

## 2024-03-24 DIAGNOSIS — O99210 Obesity complicating pregnancy, unspecified trimester: Secondary | ICD-10-CM

## 2024-03-24 DIAGNOSIS — O365930 Maternal care for other known or suspected poor fetal growth, third trimester, not applicable or unspecified: Secondary | ICD-10-CM

## 2024-03-24 DIAGNOSIS — O2643 Herpes gestationis, third trimester: Secondary | ICD-10-CM

## 2024-03-24 DIAGNOSIS — B009 Herpesviral infection, unspecified: Secondary | ICD-10-CM

## 2024-03-24 DIAGNOSIS — O26893 Other specified pregnancy related conditions, third trimester: Secondary | ICD-10-CM | POA: Diagnosis present

## 2024-03-24 DIAGNOSIS — O24419 Gestational diabetes mellitus in pregnancy, unspecified control: Secondary | ICD-10-CM

## 2024-03-24 DIAGNOSIS — O23593 Infection of other part of genital tract in pregnancy, third trimester: Secondary | ICD-10-CM

## 2024-03-24 DIAGNOSIS — O0990 Supervision of high risk pregnancy, unspecified, unspecified trimester: Secondary | ICD-10-CM

## 2024-03-24 DIAGNOSIS — E669 Obesity, unspecified: Secondary | ICD-10-CM

## 2024-03-24 DIAGNOSIS — Z3A35 35 weeks gestation of pregnancy: Secondary | ICD-10-CM

## 2024-03-24 DIAGNOSIS — Z8632 Personal history of gestational diabetes: Secondary | ICD-10-CM

## 2024-03-24 DIAGNOSIS — O99213 Obesity complicating pregnancy, third trimester: Secondary | ICD-10-CM

## 2024-03-24 DIAGNOSIS — Z8759 Personal history of other complications of pregnancy, childbirth and the puerperium: Secondary | ICD-10-CM

## 2024-03-24 DIAGNOSIS — O365990 Maternal care for other known or suspected poor fetal growth, unspecified trimester, not applicable or unspecified: Secondary | ICD-10-CM

## 2024-03-24 NOTE — Progress Notes (Signed)
 PRENATAL VISIT NOTE  Subjective:  Janet Horn is a 23 y.o. G3P1101 at [redacted]w[redacted]d being seen today for ongoing prenatal care.  She is currently monitored for the following issues for this high-risk pregnancy and has MDD (major depressive disorder), recurrent, severe, with psychosis (HCC); PTSD (post-traumatic stress disorder); Adjustment disorder with mixed emotional features; History of drug use; HSV (herpes simplex virus) infection; History of gestational diabetes; Generalized anxiety disorder; Bipolar 2 disorder, major depressive episode (HCC); Prediabetes; Obesity during pregnancy, antepartum; Supervision of high risk pregnancy, antepartum; Hx IUFD at 35 wks; Gestational diabetes mellitus (GDM) affecting pregnancy; Poor fetal growth, affecting management of mother, antepartum condition or complication; Unwanted fertility; and 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  Patient reports no complaints.  Contractions: Irritability. Vag. Bleeding: None.  Movement: Present. Denies leaking of fluid.   The following portions of the patient's history were reviewed and updated as appropriate: allergies, current medications, past family history, past medical history, past social history, past surgical history and problem list.   Objective:    Vitals:   03/24/24 1601  BP: 114/75  Pulse: (!) 101  Weight: 198 lb 5 oz (90 kg)    Fetal Status:  Fetal Heart Rate (bpm): 135   Movement: Present    General: Alert, oriented and cooperative. Patient is in no acute distress.  Skin: Skin is warm and dry. No rash noted.   Cardiovascular: Normal heart rate noted  Respiratory: Normal respiratory effort, no problems with respiration noted  Abdomen: Soft, gravid, appropriate for gestational age.  Pain/Pressure: Present     Pelvic: Cervical exam deferred        Extremities: Normal range of motion.  Edema: Trace  Mental Status: Normal mood and affect. Normal behavior. Normal judgment and thought content.    Assessment and Plan:  Pregnancy: G3P1101 at [redacted]w[redacted]d 1. Vaginal discharge during pregnancy in third trimester (Primary) - Cervicovaginal ancillary only( [place])  2. Hx IUFD at 35 wks  3. History of gestational diabetes  4. Poor fetal growth affecting management of mother, antepartum, single or unspecified fetus Recent US  25th%  5. Supervision of high risk pregnancy, antepartum Up to date FH appropriate Vigorous movement Concerns: Ready to be done with pregnancy Working with Santo Cullen-  Stephen Ehrlich  6. Gestational diabetes mellitus (GDM) affecting pregnancy Fastings 80-low 90s 2 Hr 100-143 (only one above 140 had macdonald's) IOL at 39 weeks  7. HSV (herpes simplex virus) infection Not taking Valtrex  Feels the pills are too big  8. Obesity during pregnancy, antepartum TWG=4 lb 5 oz (1.956 kg) which is below goal  Preterm labor symptoms and general obstetric precautions including but not limited to vaginal bleeding, contractions, leaking of fluid and fetal movement were reviewed in detail with the patient. Please refer to After Visit Summary for other counseling recommendations.   Return in about 1 week (around 03/31/2024) for Routine prenatal care, Mom+Baby Combined Care.  Future Appointments  Date Time Provider Department Center  03/30/2024  2:00 PM [facility] NURSE [facility]  03/30/2024  2:15 PM [facility] NST [facility]  03/31/2024  3:35 PM Abner Ables, MD [facility]  04/06/2024  8:00 AM [facility] PROVIDER 1 [facility]  04/06/2024  8:30 AM [facility] US4 [facility]  04/08/2024  2:55 PM Ebony Goldstein, MD [facility]  04/13/2024 10:00 AM [facility] PROVIDER 1 [facility]  04/13/2024 10:30 AM [facility] US4 [facility]  04/19/2024  3:15 PM Melanie Spires, MD [facility]  04/26/2024 10:55 AM Abner Ables, MD  [facility]  05/03/2024  2:35 PM [facility] US2 [facility]  05/03/2024  3:15 PM Ferdie Housekeeper, MD [facility]  05/10/2024  4:00 PM Coni Deep, [facility] None   05/11/2024 10:00 AM [place], Lynnell Sato, PA [facility] None    Abner Ables, MD

## 2024-03-24 NOTE — Progress Notes (Signed)
cer

## 2024-03-26 ENCOUNTER — Ambulatory Visit: Payer: Self-pay | Admitting: Family Medicine

## 2024-03-26 LAB — CERVICOVAGINAL ANCILLARY ONLY
Bacterial Vaginitis (gardnerella): NEGATIVE
Candida Glabrata: NEGATIVE
Candida Vaginitis: NEGATIVE
Chlamydia: NEGATIVE
Comment: NEGATIVE
Comment: NEGATIVE
Comment: NEGATIVE
Comment: NEGATIVE
Comment: NEGATIVE
Comment: NORMAL
Neisseria Gonorrhea: NEGATIVE
Trichomonas: NEGATIVE

## 2024-03-30 ENCOUNTER — Ambulatory Visit (HOSPITAL_BASED_OUTPATIENT_CLINIC_OR_DEPARTMENT_OTHER): Admitting: Maternal & Fetal Medicine

## 2024-03-30 ENCOUNTER — Ambulatory Visit: Admitting: *Deleted

## 2024-03-30 ENCOUNTER — Ambulatory Visit: Attending: Obstetrics and Gynecology | Admitting: *Deleted

## 2024-03-30 VITALS — BP 103/57 | HR 108

## 2024-03-30 DIAGNOSIS — O09293 Supervision of pregnancy with other poor reproductive or obstetric history, third trimester: Secondary | ICD-10-CM | POA: Diagnosis present

## 2024-03-30 DIAGNOSIS — Z8759 Personal history of other complications of pregnancy, childbirth and the puerperium: Secondary | ICD-10-CM

## 2024-03-30 DIAGNOSIS — Z3A36 36 weeks gestation of pregnancy: Secondary | ICD-10-CM

## 2024-03-30 DIAGNOSIS — Z7984 Long term (current) use of oral hypoglycemic drugs: Secondary | ICD-10-CM | POA: Insufficient documentation

## 2024-03-30 DIAGNOSIS — O24415 Gestational diabetes mellitus in pregnancy, controlled by oral hypoglycemic drugs: Secondary | ICD-10-CM | POA: Diagnosis present

## 2024-03-30 DIAGNOSIS — O24419 Gestational diabetes mellitus in pregnancy, unspecified control: Secondary | ICD-10-CM

## 2024-03-30 DIAGNOSIS — O24410 Gestational diabetes mellitus in pregnancy, diet controlled: Secondary | ICD-10-CM

## 2024-03-30 NOTE — Procedures (Signed)
 Janet Horn [birth year] [redacted]w[redacted]d  Fetus A Non-Stress Test Interpretation for 03/30/24-NST only  Indication: GDM, Hx IUFD 35 wks  Fetal Heart Rate A Mode: External Baseline Rate (A): 145 bpm Variability: Moderate Accelerations: 15 x 15 Decelerations: None Multiple birth?: No  Uterine Activity Mode: Toco Contraction Frequency (min): none Resting Tone Palpated: Relaxed  Interpretation (Fetal Testing) Nonstress Test Interpretation: Reactive Comments: Tracing reviewed byDr. Nolan Battle

## 2024-03-30 NOTE — Progress Notes (Signed)
 Patient information  Patient Name: Janet Horn [facility]  Patient MRN:   [id number]  Referring practice: MFM Referring Provider: [facility])  Problem List   Patient Active Problem List   Diagnosis Date Noted   Clubfeet 02/25/2024   Poor fetal growth, affecting management of mother, antepartum condition or complication 01/28/2024   Unwanted fertility 01/28/2024   Hx IUFD at 35 wks 12/02/2023   Gestational diabetes mellitus (GDM) affecting pregnancy 12/02/2023   Supervision of high risk pregnancy, antepartum 10/14/2023   Prediabetes 10/13/2023   Obesity during pregnancy, antepartum 10/13/2023   Bipolar 2 disorder, major depressive episode (HCC) 02/03/2023   Generalized anxiety disorder 11/29/2022   History of gestational diabetes 04/30/2022   HSV (herpes simplex virus) infection 12/24/2021   History of drug use 12/12/2021   Adjustment disorder with mixed emotional features 05/19/2021   MDD (major depressive disorder), recurrent, severe, 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   PTSD (post-traumatic stress disorder) 11/28/2016   Maternal Fetal medicine Consult  Janet Horn is a 23 y.o. G3P1101 at [redacted]w[redacted]d here for ultrasound and consultation. Janet Horn is doing well today but is very anxious about her blood sugars and previous stillbirth.  Today we focused on the following:   Gestational diabetes, unspecified control: The patient reports that she is unable to take her metformin due to nausea and she ends up throwing it back up.  She reports that her blood sugars are now worse with her fastings mostly at goal but her postprandials are in the 120s to 140s.  She has had a previous stillbirth at 35 weeks.  She is very anxious and already has a history of anxiety and depression and she is very concerned about another stillbirth.  I discussed that delivery can occur as early as 37 weeks in the setting of extreme maternal anxiety with a history of  stillbirth.  We discussed the difference between at 37 versus a 39-week fetus/newborn and potential complications associated with an earlier delivery.  The patient verbalized understanding and strongly desires to have a 37-week delivery.  The patient reports normal fetal movement.  I will message the OB provider to let them know.  The nonstress test was reactive today  The patient had time to ask questions that were answered to her satisfaction.  She verbalized understanding and agrees to proceed with the plan below.  Recommendations - Weekly antenatal testing with delivery at 37 weeks due to extreme maternal anxiety in the setting of his previous stillbirth. - Fetal movement precautions given  Review of Systems: A review of systems was performed and was negative except per HPI   Vitals and Physical Exam    03/30/2024    2:06 PM 03/30/2024    1:58 PM 03/24/2024    4:01 PM  Vitals with BMI  Weight   198 lbs 5 oz  Systolic 103 103 409  Diastolic 57 57 75  Pulse 108 108 101    Sitting comfortably on the sonogram table Nonlabored breathing Normal rate and rhythm Abdomen is nontender  Past pregnancies OB History  Gravida Para Term Preterm AB Living  3 2 1 1  0 1  SAB IAB Ectopic Multiple Live Births  0   0 1    # Outcome Date GA Lbr Len/2nd Weight Sex Type Anes PTL Lv  3 Current           2 Term 07/05/22 [redacted]w[redacted]d 09:19 / 00:45 7 lb 8.3 oz (3.41 kg) M  Vag-Spont EPI  LIV  1 Preterm  [redacted]w[redacted]d       FD    Obstetric Comments  Had fetal demise and D&C for first preg     I spent 20 minutes reviewing the patients chart, including labs and images as well as counseling the patient about her medical conditions. Greater than 50% of the time was spent in direct face-to-face patient counseling.  Penney Bowling  MFM, [facility]   03/30/2024  3:06 PM

## 2024-03-30 NOTE — Addendum Note (Signed)
 Addended by: Myldred Raju A on: 03/30/2024 03:10 PM   Modules accepted: Orders

## 2024-03-31 ENCOUNTER — Ambulatory Visit (INDEPENDENT_AMBULATORY_CARE_PROVIDER_SITE_OTHER): Admitting: Family Medicine

## 2024-03-31 ENCOUNTER — Other Ambulatory Visit: Payer: Self-pay

## 2024-03-31 ENCOUNTER — Other Ambulatory Visit (HOSPITAL_COMMUNITY)
Admission: RE | Admit: 2024-03-31 | Discharge: 2024-03-31 | Disposition: A | Discharge status: Home / Self Care | Source: Ambulatory Visit | Attending: Family Medicine | Admitting: Family Medicine

## 2024-03-31 VITALS — BP 115/75 | HR 105 | Wt 196.8 lb

## 2024-03-31 DIAGNOSIS — O0990 Supervision of high risk pregnancy, unspecified, unspecified trimester: Secondary | ICD-10-CM | POA: Insufficient documentation

## 2024-03-31 DIAGNOSIS — Z3A36 36 weeks gestation of pregnancy: Secondary | ICD-10-CM | POA: Diagnosis present

## 2024-03-31 DIAGNOSIS — Z8759 Personal history of other complications of pregnancy, childbirth and the puerperium: Secondary | ICD-10-CM | POA: Diagnosis not present

## 2024-03-31 DIAGNOSIS — O24419 Gestational diabetes mellitus in pregnancy, unspecified control: Secondary | ICD-10-CM | POA: Diagnosis not present

## 2024-03-31 DIAGNOSIS — O365990 Maternal care for other known or suspected poor fetal growth, unspecified trimester, not applicable or unspecified: Secondary | ICD-10-CM

## 2024-03-31 DIAGNOSIS — O365930 Maternal care for other known or suspected poor fetal growth, third trimester, not applicable or unspecified: Secondary | ICD-10-CM

## 2024-03-31 DIAGNOSIS — O358XX0 Maternal care for other (suspected) fetal abnormality and damage, not applicable or unspecified: Secondary | ICD-10-CM

## 2024-03-31 DIAGNOSIS — O359XX0 Maternal care for (suspected) fetal abnormality and damage, unspecified, not applicable or unspecified: Secondary | ICD-10-CM

## 2024-03-31 NOTE — Progress Notes (Signed)
 PRENATAL VISIT NOTE  Subjective:  Janet Horn is a 23 y.o. G3P1101 at [redacted]w[redacted]d being seen today for ongoing prenatal care.  She is currently monitored for the following issues for this high-risk pregnancy and has MDD (major depressive disorder), recurrent, severe, with psychosis (HCC); PTSD (post-traumatic stress disorder); Adjustment disorder with mixed emotional features; History of drug use; HSV (herpes simplex virus) infection; History of gestational diabetes; Generalized anxiety disorder; Bipolar 2 disorder, major depressive episode (HCC); Prediabetes; Obesity during pregnancy, antepartum; Supervision of high risk pregnancy, antepartum; Hx IUFD at 35 wks; Gestational diabetes mellitus (GDM) affecting pregnancy; Poor fetal growth, affecting management of mother, antepartum condition or complication; Unwanted fertility; and 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  Patient reports no complaints.  Contractions: Irritability. Vag. Bleeding: None.  Movement: (!) Decreased. Denies leaking of fluid.   The following portions of the patient's history were reviewed and updated as appropriate: allergies, current medications, past family history, past medical history, past social history, past surgical history and problem list.   Objective:    Vitals:   03/31/24 1519  BP: 115/75  Pulse: (!) 105  Weight: 196 lb 12.8 oz (89.3 kg)    Fetal Status:  Fetal Heart Rate (bpm): 150   Movement: (!) Decreased    General: Alert, oriented and cooperative. Patient is in no acute distress.  Skin: Skin is warm and dry. No rash noted.   Cardiovascular: Normal heart rate noted  Respiratory: Normal respiratory effort, no problems with respiration noted  Abdomen: Soft, gravid, appropriate for gestational age.  Pain/Pressure: Absent     Pelvic: Cervical exam performed in the presence of a chaperone        Extremities: Normal range of motion.  Edema: Trace  Mental Status: Normal mood and affect. Normal behavior.  Normal judgment and thought content.   Assessment and Plan:  Pregnancy: G3P1101 at [redacted]w[redacted]d 1. Supervision of high risk pregnancy, antepartum (Primary) Up to date  Decreased FM, but does feel 10 mov in 2 hours. Mostly decreased intensity NO concerns today Reviewed plan of care Working with doula  Breech today on US -- has been persistent breech Length discussion about ECV, primary CS  or vaginal breech Patient is very anxious about all option appropriately worried about rare outcomes and especially stillbirth Current plan - US  on 6/24 -- if vertex, IOL on 6/28 midnight, if breech planned CS 6/24- 6/26 with Daylene Evangelist or Cresenzo.  Referral to surgical coordinator placed  2. Hx IUFD at 35 wks IOL @37   3. Poor fetal growth affecting management of mother, antepartum, single or unspecified fetus Resolved, last growth was 25th%  4. Gestational diabetes mellitus (GDM) affecting pregnancy Fastings < 95 2h pp 140s EFW 25th% 2175g Discussed poor control and indication for delivery at 37 weeks  5. Known fetal anomaly, antepartum, single or unspecified fetus Clubfoot  6. [redacted] weeks gestation of pregnancy   Preterm labor symptoms and general obstetric precautions including but not limited to vaginal bleeding, contractions, leaking of fluid and fetal movement were reviewed in detail with the patient. Please refer to After Visit Summary for other counseling recommendations.   Return in about 1 week (around 04/07/2024) for Routine prenatal care.  Future Appointments  Date Time Provider Department Center  04/06/2024  8:00 AM [facility] PROVIDER 1 [facility]  04/06/2024  8:30 AM [facility] US4 [facility]  04/08/2024  2:55 PM Ebony Goldstein, MD [facility]  04/13/2024 10:00 AM [facility] PROVIDER 1 [facility]  04/13/2024 10:30 AM [facility]  US4 [facility]  04/19/2024  3:15 PM Melanie Spires, MD [facility]  04/26/2024 10:55 AM Abner Ables, MD [facility]  05/03/2024  2:35 PM [facility] US2  [facility]  05/03/2024  3:15 PM Ferdie Housekeeper, MD [facility]  05/10/2024  4:00 PM Coni Deep, [facility] None  05/11/2024 10:00 AM Ronny Colas, Lynnell Sato, PA [facility] None    Abner Ables, MD

## 2024-03-31 NOTE — Patient Instructions (Addendum)
 Janet Horn

## 2024-04-01 LAB — CERVICOVAGINAL ANCILLARY ONLY
Chlamydia: NEGATIVE
Comment: NEGATIVE
Comment: NORMAL
Neisseria Gonorrhea: NEGATIVE

## 2024-04-01 NOTE — Addendum Note (Signed)
 Addended by: Archer Bear on: 04/01/2024 09:53 AM   Modules accepted: Level of Service

## 2024-04-02 ENCOUNTER — Telehealth (HOSPITAL_COMMUNITY): Payer: Self-pay | Admitting: *Deleted

## 2024-04-02 ENCOUNTER — Encounter (HOSPITAL_COMMUNITY): Payer: Self-pay

## 2024-04-02 ENCOUNTER — Encounter (HOSPITAL_COMMUNITY): Payer: Self-pay | Admitting: *Deleted

## 2024-04-02 NOTE — Patient Instructions (Signed)
 Janet Horn  04/02/2024   Your procedure is scheduled on:  04/06/2024  Arrive at 0900 at Entrance C on [address] at [facility]  and [employer]. You are invited to use the FREE valet parking or use the Visitor's parking deck.  Pick up the phone at the desk and dial [phone].  Call this number if you have problems the morning of surgery: [phone]  Remember:   Do not eat food:(After Midnight) Desps de medianoche.  You may drink clear liquids until  ___0800__.  Clear liquids means a liquid you can see thru.  It can have color such as Cola or Kool aid.  Tea is OK and coffee as long as no milk or creamer of any kind.  Take these medicines the morning of surgery with A SIP OF WATER:  Take abilify  as prescribed   Do not wear jewelry, make-up or nail polish.  Do not wear lotions, powders, or perfumes. Do not wear deodorant.  Do not shave 48 hours prior to surgery.  Do not bring valuables to the hospital.  [facility] is not   responsible for any belongings or valuables brought to the hospital.  Contacts, dentures or bridgework may not be worn into surgery.  Leave suitcase in the car. After surgery it may be brought to your room.  For patients admitted to the hospital, checkout time is 11:00 AM the day of              discharge.      Please read over the following fact sheets that you were given:     Preparing for Surgery

## 2024-04-02 NOTE — Telephone Encounter (Signed)
 Preadmission screen

## 2024-04-04 ENCOUNTER — Encounter: Payer: Self-pay | Admitting: Family Medicine

## 2024-04-04 ENCOUNTER — Other Ambulatory Visit: Payer: Self-pay | Admitting: Family Medicine

## 2024-04-04 DIAGNOSIS — B009 Herpesviral infection, unspecified: Secondary | ICD-10-CM

## 2024-04-04 LAB — CULTURE, BETA STREP (GROUP B ONLY): Strep Gp B Culture: NEGATIVE

## 2024-04-04 MED ORDER — VALACYCLOVIR HCL 1 G PO TABS
1000.0000 mg | ORAL_TABLET | Freq: Two times a day (BID) | ORAL | 0 refills | Status: DC
  Filled 2024-04-04: qty 10, 5d supply, fill #0

## 2024-04-04 NOTE — Progress Notes (Signed)
 MyChart message received stating an active HSV.   Sent in treatment Valtrex   Last visit fetus was breech. Planned CS was requested as patient does not want ECV.   Now that patient is having active HSV outbreak C-section is indicated regardless. Message sent to patient.   Scheduled for CS 6/24

## 2024-04-05 ENCOUNTER — Encounter (HOSPITAL_COMMUNITY)
Admission: RE | Admit: 2024-04-05 | Discharge: 2024-04-05 | Disposition: A | Discharge status: Home / Self Care | Source: Ambulatory Visit | Attending: Family Medicine | Admitting: Family Medicine

## 2024-04-05 ENCOUNTER — Other Ambulatory Visit: Payer: Self-pay

## 2024-04-05 ENCOUNTER — Other Ambulatory Visit (HOSPITAL_COMMUNITY): Payer: Self-pay

## 2024-04-05 ENCOUNTER — Other Ambulatory Visit: Payer: Self-pay | Admitting: Obstetrics & Gynecology

## 2024-04-05 DIAGNOSIS — B009 Herpesviral infection, unspecified: Secondary | ICD-10-CM

## 2024-04-06 ENCOUNTER — Inpatient Hospital Stay (HOSPITAL_COMMUNITY): Admitting: Anesthesiology

## 2024-04-06 ENCOUNTER — Ambulatory Visit

## 2024-04-06 ENCOUNTER — Other Ambulatory Visit: Payer: Self-pay

## 2024-04-06 ENCOUNTER — Encounter (HOSPITAL_COMMUNITY): Payer: Self-pay | Admitting: Obstetrics & Gynecology

## 2024-04-06 ENCOUNTER — Ambulatory Visit (HOSPITAL_BASED_OUTPATIENT_CLINIC_OR_DEPARTMENT_OTHER): Attending: Obstetrics and Gynecology | Admitting: Obstetrics and Gynecology

## 2024-04-06 ENCOUNTER — Inpatient Hospital Stay (HOSPITAL_COMMUNITY)
Admission: RE | Admit: 2024-04-06 | Discharge: 2024-04-08 | DRG: 784 | Disposition: A | Discharge status: Home / Self Care | Attending: Obstetrics & Gynecology | Admitting: Obstetrics & Gynecology

## 2024-04-06 ENCOUNTER — Encounter (HOSPITAL_COMMUNITY)
Admission: RE | Disposition: A | Discharge status: Home / Self Care | Payer: Self-pay | Source: Home / Self Care | Attending: Obstetrics & Gynecology

## 2024-04-06 VITALS — BP 111/73 | HR 82

## 2024-04-06 DIAGNOSIS — O24410 Gestational diabetes mellitus in pregnancy, diet controlled: Secondary | ICD-10-CM | POA: Insufficient documentation

## 2024-04-06 DIAGNOSIS — A6009 Herpesviral infection of other urogenital tract: Secondary | ICD-10-CM | POA: Diagnosis present

## 2024-04-06 DIAGNOSIS — Z833 Family history of diabetes mellitus: Secondary | ICD-10-CM

## 2024-04-06 DIAGNOSIS — Z8759 Personal history of other complications of pregnancy, childbirth and the puerperium: Secondary | ICD-10-CM

## 2024-04-06 DIAGNOSIS — O359XX0 Maternal care for (suspected) fetal abnormality and damage, unspecified, not applicable or unspecified: Secondary | ICD-10-CM

## 2024-04-06 DIAGNOSIS — Z5941 Food insecurity: Secondary | ICD-10-CM | POA: Diagnosis not present

## 2024-04-06 DIAGNOSIS — O43103 Malformation of placenta, unspecified, third trimester: Secondary | ICD-10-CM

## 2024-04-06 DIAGNOSIS — B009 Herpesviral infection, unspecified: Secondary | ICD-10-CM | POA: Insufficient documentation

## 2024-04-06 DIAGNOSIS — F3181 Bipolar II disorder: Secondary | ICD-10-CM | POA: Diagnosis present

## 2024-04-06 DIAGNOSIS — O99213 Obesity complicating pregnancy, third trimester: Secondary | ICD-10-CM | POA: Insufficient documentation

## 2024-04-06 DIAGNOSIS — Z3A36 36 weeks gestation of pregnancy: Secondary | ICD-10-CM

## 2024-04-06 DIAGNOSIS — O0990 Supervision of high risk pregnancy, unspecified, unspecified trimester: Secondary | ICD-10-CM

## 2024-04-06 DIAGNOSIS — O9832 Other infections with a predominantly sexual mode of transmission complicating childbirth: Secondary | ICD-10-CM | POA: Diagnosis present

## 2024-04-06 DIAGNOSIS — Z3A37 37 weeks gestation of pregnancy: Secondary | ICD-10-CM | POA: Insufficient documentation

## 2024-04-06 DIAGNOSIS — O24420 Gestational diabetes mellitus in childbirth, diet controlled: Secondary | ICD-10-CM | POA: Diagnosis not present

## 2024-04-06 DIAGNOSIS — Z9079 Acquired absence of other genital organ(s): Secondary | ICD-10-CM

## 2024-04-06 DIAGNOSIS — O365990 Maternal care for other known or suspected poor fetal growth, unspecified trimester, not applicable or unspecified: Secondary | ICD-10-CM | POA: Diagnosis present

## 2024-04-06 DIAGNOSIS — O358XX0 Maternal care for other (suspected) fetal abnormality and damage, not applicable or unspecified: Secondary | ICD-10-CM | POA: Diagnosis present

## 2024-04-06 DIAGNOSIS — O365930 Maternal care for other known or suspected poor fetal growth, third trimester, not applicable or unspecified: Secondary | ICD-10-CM | POA: Diagnosis present

## 2024-04-06 DIAGNOSIS — Z5986 Financial insecurity: Secondary | ICD-10-CM

## 2024-04-06 DIAGNOSIS — O09293 Supervision of pregnancy with other poor reproductive or obstetric history, third trimester: Secondary | ICD-10-CM | POA: Insufficient documentation

## 2024-04-06 DIAGNOSIS — Z302 Encounter for sterilization: Secondary | ICD-10-CM | POA: Diagnosis not present

## 2024-04-06 DIAGNOSIS — Z3009 Encounter for other general counseling and advice on contraception: Secondary | ICD-10-CM | POA: Diagnosis present

## 2024-04-06 DIAGNOSIS — O99214 Obesity complicating childbirth: Secondary | ICD-10-CM | POA: Diagnosis present

## 2024-04-06 DIAGNOSIS — O98513 Other viral diseases complicating pregnancy, third trimester: Secondary | ICD-10-CM | POA: Insufficient documentation

## 2024-04-06 DIAGNOSIS — A6000 Herpesviral infection of urogenital system, unspecified: Secondary | ICD-10-CM | POA: Diagnosis present

## 2024-04-06 DIAGNOSIS — O321XX0 Maternal care for breech presentation, not applicable or unspecified: Principal | ICD-10-CM | POA: Diagnosis present

## 2024-04-06 DIAGNOSIS — O99210 Obesity complicating pregnancy, unspecified trimester: Secondary | ICD-10-CM

## 2024-04-06 DIAGNOSIS — O24419 Gestational diabetes mellitus in pregnancy, unspecified control: Secondary | ICD-10-CM

## 2024-04-06 DIAGNOSIS — O99343 Other mental disorders complicating pregnancy, third trimester: Secondary | ICD-10-CM | POA: Insufficient documentation

## 2024-04-06 DIAGNOSIS — Z8249 Family history of ischemic heart disease and other diseases of the circulatory system: Secondary | ICD-10-CM | POA: Diagnosis not present

## 2024-04-06 DIAGNOSIS — Z8632 Personal history of gestational diabetes: Secondary | ICD-10-CM | POA: Diagnosis present

## 2024-04-06 DIAGNOSIS — Z362 Encounter for other antenatal screening follow-up: Secondary | ICD-10-CM | POA: Insufficient documentation

## 2024-04-06 DIAGNOSIS — Z148 Genetic carrier of other disease: Secondary | ICD-10-CM | POA: Insufficient documentation

## 2024-04-06 DIAGNOSIS — F333 Major depressive disorder, recurrent, severe with psychotic symptoms: Secondary | ICD-10-CM | POA: Diagnosis present

## 2024-04-06 DIAGNOSIS — Z555 Less than a high school diploma: Secondary | ICD-10-CM

## 2024-04-06 DIAGNOSIS — O34219 Maternal care for unspecified type scar from previous cesarean delivery: Secondary | ICD-10-CM | POA: Diagnosis not present

## 2024-04-06 DIAGNOSIS — O2643 Herpes gestationis, third trimester: Secondary | ICD-10-CM

## 2024-04-06 DIAGNOSIS — F1991 Other psychoactive substance use, unspecified, in remission: Secondary | ICD-10-CM | POA: Diagnosis present

## 2024-04-06 DIAGNOSIS — Z5982 Transportation insecurity: Secondary | ICD-10-CM

## 2024-04-06 DIAGNOSIS — F411 Generalized anxiety disorder: Secondary | ICD-10-CM | POA: Diagnosis present

## 2024-04-06 DIAGNOSIS — Z98891 History of uterine scar from previous surgery: Principal | ICD-10-CM

## 2024-04-06 LAB — CBC
HCT: 36.7 % (ref 36.0–46.0)
Hemoglobin: 11.2 g/dL — ABNORMAL LOW (ref 12.0–15.0)
MCH: 22.6 pg — ABNORMAL LOW (ref 26.0–34.0)
MCHC: 30.5 g/dL (ref 30.0–36.0)
MCV: 74.1 fL — ABNORMAL LOW (ref 80.0–100.0)
Platelets: 332 10*3/uL (ref 150–400)
RBC: 4.95 MIL/uL (ref 3.87–5.11)
RDW: 14.7 % (ref 11.5–15.5)
WBC: 10.7 10*3/uL — ABNORMAL HIGH (ref 4.0–10.5)
nRBC: 0 % (ref 0.0–0.2)

## 2024-04-06 LAB — GLUCOSE, CAPILLARY
Glucose-Capillary: 93 mg/dL (ref 70–99)
Glucose-Capillary: 87 mg/dL (ref 70–99)

## 2024-04-06 LAB — TYPE AND SCREEN
ABO/RH(D): AB POS
Antibody Screen: NEGATIVE

## 2024-04-06 LAB — RPR: RPR Ser Ql: NONREACTIVE

## 2024-04-06 SURGERY — Surgical Case
Anesthesia: Spinal | Laterality: Bilateral

## 2024-04-06 MED ORDER — MORPHINE SULFATE (PF) 0.5 MG/ML IJ SOLN
INTRAMUSCULAR | Status: DC | PRN
  Administered 2024-04-06: 150 ug via INTRATHECAL

## 2024-04-06 MED ORDER — POVIDONE-IODINE 10 % EX SWAB
2.0000 | Freq: Once | CUTANEOUS | Status: AC
  Administered 2024-04-06: 2 via TOPICAL

## 2024-04-06 MED ORDER — SOD CITRATE-CITRIC ACID 500-334 MG/5ML PO SOLN
ORAL | Status: AC
Start: 2024-04-06 — End: 2024-04-06
  Filled 2024-04-06: qty 30

## 2024-04-06 MED ORDER — SOD CITRATE-CITRIC ACID 500-334 MG/5ML PO SOLN
30.0000 mL | ORAL | Status: AC
  Administered 2024-04-06: 30 mL via ORAL

## 2024-04-06 MED ORDER — SCOPOLAMINE 1 MG/3DAYS TD PT72
MEDICATED_PATCH | TRANSDERMAL | Status: AC
Start: 2024-04-06 — End: 2024-04-06
  Filled 2024-04-06: qty 1

## 2024-04-06 MED ORDER — TRANEXAMIC ACID-NACL 1000-0.7 MG/100ML-% IV SOLN
INTRAVENOUS | Status: AC
Start: 2024-04-06 — End: 2024-04-06
  Filled 2024-04-06: qty 100

## 2024-04-06 MED ORDER — SENNOSIDES-DOCUSATE SODIUM 8.6-50 MG PO TABS
2.0000 | ORAL_TABLET | Freq: Every day | ORAL | Status: DC
  Administered 2024-04-07 – 2024-04-08 (×2): 2 via ORAL
  Filled 2024-04-06 (×2): qty 2

## 2024-04-06 MED ORDER — PRENATAL MULTIVITAMIN CH
1.0000 | ORAL_TABLET | Freq: Every day | ORAL | Status: DC
  Filled 2024-04-06 (×2): qty 1

## 2024-04-06 MED ORDER — KETOROLAC TROMETHAMINE 30 MG/ML IJ SOLN
INTRAMUSCULAR | Status: AC
Start: 2024-04-06 — End: 2024-04-06
  Filled 2024-04-06: qty 1

## 2024-04-06 MED ORDER — ARIPIPRAZOLE 2 MG PO TABS
2.0000 mg | ORAL_TABLET | Freq: Every day | ORAL | Status: DC
  Administered 2024-04-07 – 2024-04-08 (×2): 2 mg via ORAL
  Filled 2024-04-06 (×2): qty 1

## 2024-04-06 MED ORDER — FENTANYL CITRATE (PF) 100 MCG/2ML IJ SOLN
INTRAMUSCULAR | Status: AC
  Filled 2024-04-06: qty 2

## 2024-04-06 MED ORDER — MEASLES, MUMPS & RUBELLA VAC IJ SOLR
0.5000 mL | Freq: Once | INTRAMUSCULAR | Status: DC
Start: 2024-04-07 — End: 2024-04-08

## 2024-04-06 MED ORDER — OXYTOCIN-SODIUM CHLORIDE 30-0.9 UT/500ML-% IV SOLN: 2.5000 [IU]/h | INTRAVENOUS | Status: AC

## 2024-04-06 MED ORDER — DIPHENHYDRAMINE HCL 50 MG/ML IJ SOLN
12.5000 mg | INTRAMUSCULAR | Status: DC | PRN
  Administered 2024-04-06: 12.5 mg via INTRAVENOUS

## 2024-04-06 MED ORDER — DIBUCAINE (PERIANAL) 1 % EX OINT: 1.0000 | TOPICAL_OINTMENT | CUTANEOUS | Status: DC | PRN

## 2024-04-06 MED ORDER — MORPHINE SULFATE (PF) 0.5 MG/ML IJ SOLN
INTRAMUSCULAR | Status: AC
  Filled 2024-04-06: qty 10

## 2024-04-06 MED ORDER — MENTHOL 3 MG MT LOZG: 1.0000 | LOZENGE | OROMUCOSAL | Status: DC | PRN

## 2024-04-06 MED ORDER — GABAPENTIN 100 MG PO CAPS
100.0000 mg | ORAL_CAPSULE | Freq: Three times a day (TID) | ORAL | Status: DC
  Administered 2024-04-06 – 2024-04-08 (×6): 100 mg via ORAL
  Filled 2024-04-06 (×7): qty 1

## 2024-04-06 MED ORDER — WITCH HAZEL-GLYCERIN EX PADS: 1.0000 | MEDICATED_PAD | CUTANEOUS | Status: DC | PRN

## 2024-04-06 MED ORDER — ONDANSETRON HCL 4 MG/2ML IJ SOLN
INTRAMUSCULAR | Status: DC | PRN
  Administered 2024-04-06: 4 mg via INTRAVENOUS

## 2024-04-06 MED ORDER — OXYTOCIN-SODIUM CHLORIDE 30-0.9 UT/500ML-% IV SOLN
INTRAVENOUS | Status: DC | PRN
  Administered 2024-04-06: 300 mL via INTRAVENOUS

## 2024-04-06 MED ORDER — VALACYCLOVIR HCL 500 MG PO TABS
1000.0000 mg | ORAL_TABLET | Freq: Two times a day (BID) | ORAL | Status: DC
  Filled 2024-04-06 (×2): qty 2

## 2024-04-06 MED ORDER — IBUPROFEN 800 MG PO TABS
800.0000 mg | ORAL_TABLET | Freq: Three times a day (TID) | ORAL | Status: DC
  Administered 2024-04-07 – 2024-04-08 (×2): 800 mg via ORAL
  Filled 2024-04-06 (×3): qty 1

## 2024-04-06 MED ORDER — OXYTOCIN-SODIUM CHLORIDE 30-0.9 UT/500ML-% IV SOLN
INTRAVENOUS | Status: AC
  Filled 2024-04-06: qty 500

## 2024-04-06 MED ORDER — PHENYLEPHRINE HCL-NACL 20-0.9 MG/250ML-% IV SOLN
INTRAVENOUS | Status: DC | PRN
  Administered 2024-04-06: 60 ug/min via INTRAVENOUS

## 2024-04-06 MED ORDER — NALOXONE HCL 4 MG/10ML IJ SOLN: 1.0000 ug/kg/h | INTRAVENOUS | Status: DC | PRN

## 2024-04-06 MED ORDER — FENTANYL CITRATE (PF) 100 MCG/2ML IJ SOLN
INTRAMUSCULAR | Status: DC | PRN
  Administered 2024-04-06: 15 ug via INTRATHECAL

## 2024-04-06 MED ORDER — DEXAMETHASONE SODIUM PHOSPHATE 10 MG/ML IJ SOLN
INTRAMUSCULAR | Status: AC
  Filled 2024-04-06: qty 1

## 2024-04-06 MED ORDER — SODIUM CHLORIDE 0.9 % IR SOLN
Status: DC | PRN
  Administered 2024-04-06: 1

## 2024-04-06 MED ORDER — TRANEXAMIC ACID-NACL 1000-0.7 MG/100ML-% IV SOLN
1000.0000 mg | Freq: Once | INTRAVENOUS | Status: AC
  Administered 2024-04-06: 1000 mg via INTRAVENOUS

## 2024-04-06 MED ORDER — ENOXAPARIN SODIUM 60 MG/0.6ML IJ SOSY
45.0000 mg | PREFILLED_SYRINGE | INTRAMUSCULAR | Status: DC
  Administered 2024-04-07 – 2024-04-08 (×2): 45 mg via SUBCUTANEOUS
  Filled 2024-04-06 (×2): qty 0.6

## 2024-04-06 MED ORDER — KETOROLAC TROMETHAMINE 30 MG/ML IJ SOLN
30.0000 mg | Freq: Four times a day (QID) | INTRAMUSCULAR | Status: DC | PRN
  Administered 2024-04-06: 30 mg via INTRAVENOUS

## 2024-04-06 MED ORDER — COCONUT OIL OIL
1.0000 | TOPICAL_OIL | Status: DC | PRN
Start: 2024-04-06 — End: 2024-04-08

## 2024-04-06 MED ORDER — CEFAZOLIN SODIUM-DEXTROSE 2-4 GM/100ML-% IV SOLN
INTRAVENOUS | Status: AC
  Filled 2024-04-06: qty 100

## 2024-04-06 MED ORDER — SIMETHICONE 80 MG PO CHEW
80.0000 mg | CHEWABLE_TABLET | Freq: Three times a day (TID) | ORAL | Status: DC
  Filled 2024-04-06 (×3): qty 1

## 2024-04-06 MED ORDER — ACETAMINOPHEN 10 MG/ML IV SOLN
INTRAVENOUS | Status: AC
Start: 2024-04-06 — End: 2024-04-06
  Filled 2024-04-06: qty 100

## 2024-04-06 MED ORDER — SODIUM CHLORIDE 0.9% FLUSH
3.0000 mL | INTRAVENOUS | Status: DC | PRN
  Administered 2024-04-06 – 2024-04-07 (×2): 3 mL via INTRAVENOUS

## 2024-04-06 MED ORDER — KETOROLAC TROMETHAMINE 30 MG/ML IJ SOLN: 30.0000 mg | Freq: Four times a day (QID) | INTRAMUSCULAR | Status: DC | PRN

## 2024-04-06 MED ORDER — SIMETHICONE 80 MG PO CHEW: 80.0000 mg | CHEWABLE_TABLET | ORAL | Status: DC | PRN

## 2024-04-06 MED ORDER — ACETAMINOPHEN 500 MG PO TABS
1000.0000 mg | ORAL_TABLET | Freq: Four times a day (QID) | ORAL | Status: DC
Start: 2024-04-07 — End: 2024-04-06

## 2024-04-06 MED ORDER — KETOROLAC TROMETHAMINE 30 MG/ML IJ SOLN
30.0000 mg | Freq: Four times a day (QID) | INTRAMUSCULAR | Status: AC
  Administered 2024-04-06 – 2024-04-07 (×4): 30 mg via INTRAVENOUS
  Filled 2024-04-06 (×4): qty 1

## 2024-04-06 MED ORDER — ONDANSETRON HCL 4 MG/2ML IJ SOLN: 4.0000 mg | Freq: Three times a day (TID) | INTRAMUSCULAR | Status: DC | PRN

## 2024-04-06 MED ORDER — DIPHENHYDRAMINE HCL 50 MG/ML IJ SOLN
INTRAMUSCULAR | Status: AC
  Filled 2024-04-06: qty 1

## 2024-04-06 MED ORDER — ONDANSETRON HCL 4 MG/2ML IJ SOLN
INTRAMUSCULAR | Status: AC
  Filled 2024-04-06: qty 2

## 2024-04-06 MED ORDER — SCOPOLAMINE 1 MG/3DAYS TD PT72
1.0000 | MEDICATED_PATCH | Freq: Once | TRANSDERMAL | Status: DC
  Administered 2024-04-06: 1.5 mg via TRANSDERMAL

## 2024-04-06 MED ORDER — DIPHENHYDRAMINE HCL 25 MG PO CAPS: 25.0000 mg | ORAL_CAPSULE | Freq: Four times a day (QID) | ORAL | Status: DC | PRN

## 2024-04-06 MED ORDER — MEDROXYPROGESTERONE ACETATE 150 MG/ML IM SUSP: 150.0000 mg | INTRAMUSCULAR | Status: DC | PRN

## 2024-04-06 MED ORDER — ACETAMINOPHEN 500 MG PO TABS
1000.0000 mg | ORAL_TABLET | Freq: Three times a day (TID) | ORAL | Status: DC
  Administered 2024-04-06 – 2024-04-08 (×5): 1000 mg via ORAL
  Filled 2024-04-06 (×6): qty 2

## 2024-04-06 MED ORDER — LACTATED RINGERS IV SOLN: INTRAVENOUS | Status: DC

## 2024-04-06 MED ORDER — ACETAMINOPHEN 10 MG/ML IV SOLN
INTRAVENOUS | Status: DC | PRN
Start: 2024-04-06 — End: 2024-04-06
  Administered 2024-04-06: 1000 mg via INTRAVENOUS

## 2024-04-06 MED ORDER — ZOLPIDEM TARTRATE 5 MG PO TABS
5.0000 mg | ORAL_TABLET | Freq: Every evening | ORAL | Status: DC | PRN
Start: 2024-04-06 — End: 2024-04-08

## 2024-04-06 MED ORDER — DIPHENHYDRAMINE HCL 25 MG PO CAPS: 25.0000 mg | ORAL_CAPSULE | ORAL | Status: DC | PRN

## 2024-04-06 MED ORDER — NALOXONE HCL 0.4 MG/ML IJ SOLN: 0.4000 mg | INTRAMUSCULAR | Status: DC | PRN

## 2024-04-06 MED ORDER — STERILE WATER FOR IRRIGATION IR SOLN
Status: DC | PRN
  Administered 2024-04-06: 1

## 2024-04-06 MED ORDER — HYDROXYZINE PAMOATE 25 MG PO CAPS: 25.0000 mg | ORAL_CAPSULE | Freq: Two times a day (BID) | ORAL | Status: DC | PRN

## 2024-04-06 MED ORDER — OXYCODONE HCL 5 MG PO TABS: 5.0000 mg | ORAL_TABLET | ORAL | Status: DC | PRN

## 2024-04-06 MED ORDER — CEFAZOLIN SODIUM-DEXTROSE 2-4 GM/100ML-% IV SOLN
2.0000 g | INTRAVENOUS | Status: AC
  Administered 2024-04-06: 2 g via INTRAVENOUS

## 2024-04-06 SURGICAL SUPPLY — 30 items
CATH ROBINSON RED A/P 16FR (CATHETERS) IMPLANT
CHLORAPREP W/TINT 26ML (MISCELLANEOUS) ×2 IMPLANT
CLAMP UMBILICAL CORD (MISCELLANEOUS) ×1 IMPLANT
CLOTH BEACON ORANGE TIMEOUT ST (SAFETY) ×1 IMPLANT
DISSECTOR SURG LIGASURE 21 (MISCELLANEOUS) IMPLANT
DRSG OPSITE POSTOP 4X10 (GAUZE/BANDAGES/DRESSINGS) ×1 IMPLANT
ELECTRODE REM PT RTRN 9FT ADLT (ELECTROSURGICAL) ×1 IMPLANT
EXTRACTOR VACUUM M CUP 4 TUBE (SUCTIONS) IMPLANT
GLOVE BIO SURGEON STRL SZ7.5 (GLOVE) ×1 IMPLANT
GLOVE BIOGEL PI IND STRL 7.0 (GLOVE) ×1 IMPLANT
GLOVE BIOGEL PI IND STRL 8 (GLOVE) ×1 IMPLANT
GOWN STRL REUS W/TWL LRG LVL3 (GOWN DISPOSABLE) ×2 IMPLANT
GOWN STRL REUS W/TWL XL LVL3 (GOWN DISPOSABLE) ×1 IMPLANT
KIT ABG SYR 3ML LUER SLIP (SYRINGE) IMPLANT
LIGASURE IMPACT 36 18CM CVD LR (INSTRUMENTS) ×1 IMPLANT
NDL HYPO 25X5/8 SAFETYGLIDE (NEEDLE) IMPLANT
NEEDLE HYPO 25X5/8 SAFETYGLIDE (NEEDLE) IMPLANT
NS IRRIG 1000ML POUR BTL (IV SOLUTION) ×1 IMPLANT
PACK C SECTION WH (CUSTOM PROCEDURE TRAY) ×1 IMPLANT
PAD OB MATERNITY 4.3X12.25 (PERSONAL CARE ITEMS) ×1 IMPLANT
PENCIL SMOKE EVAC W/HOLSTER (ELECTROSURGICAL) ×1 IMPLANT
RTRCTR C-SECT PINK 25CM LRG (MISCELLANEOUS) ×1 IMPLANT
SUT MNCRL 0 VIOLET CTX 36 (SUTURE) ×2 IMPLANT
SUT PLAIN ABS 2-0 CT1 27XMFL (SUTURE) IMPLANT
SUT VIC AB 0 CTX36XBRD ANBCTRL (SUTURE) ×1 IMPLANT
SUT VIC AB 2-0 CT1 TAPERPNT 27 (SUTURE) ×1 IMPLANT
SUT VIC AB 4-0 KS 27 (SUTURE) ×1 IMPLANT
TOWEL OR 17X24 6PK STRL BLUE (TOWEL DISPOSABLE) ×1 IMPLANT
TRAY FOLEY W/BAG SLVR 14FR LF (SET/KITS/TRAYS/PACK) ×1 IMPLANT
WATER STERILE IRR 1000ML POUR (IV SOLUTION) ×1 IMPLANT
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
## 2024-04-06 NOTE — Progress Notes (Signed)
 Maternal-Fetal Medicine Consultation Name: Janet Horn MRN: [id number]  G3 P1101 at 37-weeks' gestation.  Patient is here for antenatal testing. She is scheduled to have elective cesarean delivery today because of active genital herpes. - Gestational diabetes.  Patient reports diabetes is not well-controlled. - History of fetal demise at 35 weeks followed by a term vaginal delivery and the second child is not good health.  Ultrasound Amniotic fluid is normal good fetal activity seen.  Breech presentation.  Antenatal testing is reassuring.  BPP 8/8. Fetal feet could not be assessed today.  I counseled the patient on the findings and reassured her that she has several indications for cesarean delivery including poor control diabetes and active genital herpes.  Patient expired to have bilateral tubal sterilization.   Recommendations -Cesarean delivery today. Consultation including face-to-face (more than 50%) counseling 10 minutes.

## 2024-04-06 NOTE — Lactation Note (Signed)
 This note was copied from a baby's chart. Lactation Consultation Note  Patient Name: Janet Horn Date: 04/06/2024 Age:23 hours Reason for consult: Initial assessment;Early term 41-38.6wks Mom stated the baby has been latching well. Mom stated it hurts to latch on the Lt. Breast. Mom has been latching in football position.  Newborn feeding habits, STS, I&O, positioning reviewed. Mom encouraged to feed baby 8-12 times/24 hours and with feeding cues.  Mom had formula at bedside. Mom stated she doesn't like the bottle she likes the breast better. Mom has GDM. LC suggested mom does supplement after BF for at least 24-48 hrs. Asked mom if she would call for [facility] for next feeding. Mom stated yes that she also wants LC to show her more positions.     Maternal Data Has patient been taught Hand Expression?: Yes  Feeding Mother's Current Feeding Choice: Breast Milk and Donor Milk  LATCH Score                    Lactation Tools Discussed/Used    Interventions Interventions: Breast feeding basics reviewed;Education;LC Services brochure;CDC milk storage guidelines  Discharge    Consult Status Consult Status: Follow-up Date: 04/07/24 Follow-up type: In-patient    Janet Horn 04/06/2024, 11:15 PM

## 2024-04-06 NOTE — Op Note (Signed)
 Cesarean Section Operative Note   Patient: Janet Horn  Date of Procedure: 04/06/2024  Procedure: Primary Low Transverse Cesarean and Bilateral Tubal Ligation via Bilateral salpingectomy   Indications: malpresentation: breech frank and herpes virus infection, uncontrolled A1GDM, undesired fertility  Pre-operative Diagnosis: Primary cesarean section, Breech presentation; Undesired Fertility  Post-operative Diagnosis: Same and Bilateral Tubal Sterilization via Bilateral salpingectomy  TOLAC Candidate: No  Surgeon: Surgeons and Role:    * Ilean Norleen GAILS, MD - Primary    * Nicholaus Almarie CHRISTELLA, MD - Assisting  Assistants: An experienced assistant was required given the standard of surgical care given the complexity of the case.  This assistant was needed for exposure, dissection, suctioning, retraction, instrument exchange, assisting with delivery with administration of fundal pressure, and for overall help during the procedure.   Anesthesia: spinal  Anesthesiologist: Niels Marien CROME, MD   Antibiotics: Cefazolin   Estimated Blood Loss: 102 ml   Total IV Fluids: 1300 ml  Urine Output: 100 cc OF clear urine  Specimens: bilateral fallopian tubes to pathology  Complications: no complications   Indications: Janet Horn is a 23 y.o. [phone] with an IUP [redacted]w[redacted]d presenting for scheduled cesarean secondary to the indications listed above. Clinical course notable for declining to check CBGs and declining to start any medications.  The risks of cesarean section were discussed with the patient including but were not limited to: bleeding which may require transfusion or reoperation; infection which may require antibiotics; injury to bowel, bladder, ureters or other surrounding organs; injury to the fetus; need for additional procedures including hysterectomy in the event of a life-threatening hemorrhage; placental abnormalities wth subsequent pregnancies, incisional  problems, thromboembolic phenomenon and other postoperative/anesthesia complications.  Patient also desires permanent sterilization.  Other reversible forms of contraception were discussed with patient; she declines all other modalities. Risks of procedure discussed with patient including but not limited to: risk of regret, permanence of method, bleeding, infection, injury to surrounding organs and need for additional procedures.  Failure risk of about 1% with increased risk of ectopic gestation if pregnancy occurs was also discussed with patient.  Also discussed possibility of post-tubal pain syndrome. The patient concurred with the proposed plan, giving informed written consent for the procedures.  Patient has been NPO since last night she will remain NPO for procedure. Anesthesia and OR aware.  Preoperative prophylactic antibiotics and SCDs ordered on call to the OR.   Findings: Viable infant in breech complete presentation, nuchal x1. Apgars 9, 9, . Weight 2990 g. Clear amniotic fluid. Normal placenta, three vessel cord. Normal uterus, Normal bilateral fallopian tubes, Normal bilateral ovaries. No adhesive disease was encountered.  Procedure Details: A Time Out was held and the above information confirmed. The patient received intravenous antibiotics and had sequential compression devices applied to her lower extremities preoperatively. The patient was taken back to the operative suite where spinal anesthesia was administered. After induction of anesthesia, the patient was draped and prepped in the usual sterile manner and placed in a dorsal supine position with a leftward tilt. A low transverse skin incision was made with scalpel and carried down through the subcutaneous tissue to the fascia. Fascial incision was made and extended transversely. The fascia was separated from the underlying rectus tissue superiorly and inferiorly. The rectus muscles were separated in the midline bluntly and the peritoneum was  entered bluntly. An Alexis retractor was placed to aid in visualization of the uterus. A bladder flap was not developed. A low transverse  uterine incision was made. The infant was successfully delivered from breech (complete) presentation with the standard maneuvers, the umbilical cord was clamped after 1 minute. Cord ph was not sent, and cord blood was obtained for evaluation. The placenta was removed Intact and appeared normal. The uterine incision was closed with a single layer running unlocked suture of 0-Monocryl. Overall, excellent hemostasis was noted.  Attention was then turned to the fallopian tubes. The left Fallopian tube was identified and then traced to it's fimbriae. Using the Ligasure device and taking care to avoid large vascular structures, the right fallopian tube was removed sequentially from the fimbriae to the cornua, with excellent hemostasis noted. Attention was then turned to the left fallopian tube, and after confirmation of identification by tracing the tube out to the fimbriae, the same procedure was then performed on with excellent hemostasis noted.  The abdomen and pelvis were cleared of all clot and debris and the Thersia was removed. Hemostasis was confirmed on all surfaces.  The peritoneum was reapproximated using 2-0 vicryl . The fascia was then closed using 0-Vicryl in a running fashion. The subcutaneous layer was reapproximated with 2-0 plain gut suture. The skin was closed with a 4-0 vicryl subcuticular stitch. The patient tolerated the procedure well. Sponge, lap, instrument and needle counts were correct x 2. She was taken to the recovery room in stable condition.  Disposition: PACU - hemodynamically stable.    Signed: Almarie CHRISTELLA Moats, MD OB Fellow, [facility], [facility]

## 2024-04-06 NOTE — Transfer of Care (Signed)
 Immediate Anesthesia Transfer of Care Note  Patient: Janet Horn  Procedure(s) Performed: CESAREAN SECTION, WITH BILATERAL TUBAL LIGATION (Bilateral)  Patient Location: PACU  Anesthesia Type:Spinal  Level of Consciousness: awake, alert , and oriented  Airway & Oxygen Therapy: Patient Spontanous Breathing  Post-op Assessment: Report given to RN and Post -op Vital signs reviewed and stable  Post vital signs: Reviewed and stable  Last Vitals:  Vitals Value Taken Time  BP 103/48 04/06/24 15:05  Temp    Pulse 64 04/06/24 15:07  Resp 16 04/06/24 15:07  SpO2 97 % 04/06/24 15:07  Vitals shown include unfiled device data.  Last Pain:  Vitals:   04/06/24 1050  TempSrc:   PainSc: 0-No pain         Complications: No notable events documented.

## 2024-04-06 NOTE — Anesthesia Preprocedure Evaluation (Signed)
 Anesthesia Evaluation  Patient identified by MRN, date of birth, ID band Patient awake    Reviewed: Allergy & Precautions, NPO status , Patient's Chart, lab work & pertinent test results  Airway Mallampati: II  TM Distance: >3 FB Neck ROM: Full    Dental no notable dental hx.    Pulmonary neg pulmonary ROS   Pulmonary exam normal breath sounds clear to auscultation       Cardiovascular negative cardio ROS Normal cardiovascular exam Rhythm:Regular Rate:Normal     Neuro/Psych  Headaches PSYCHIATRIC DISORDERS Anxiety Depression Bipolar Disorder      GI/Hepatic negative GI ROS, Neg liver ROS,,,  Endo/Other  diabetes, Gestational    Renal/GU negative Renal ROS  negative genitourinary   Musculoskeletal negative musculoskeletal ROS (+)    Abdominal   Peds  Hematology negative hematology ROS (+)   Anesthesia Other Findings Primary C/S for breech  Reproductive/Obstetrics (+) Pregnancy                             Anesthesia Physical Anesthesia Plan  ASA: 3  Anesthesia Plan: Spinal   Post-op Pain Management:    Induction:   PONV Risk Score and Plan: Treatment may vary due to age or medical condition  Airway Management Planned: Natural Airway  Additional Equipment:   Intra-op Plan:   Post-operative Plan:   Informed Consent: I have reviewed the patients History and Physical, chart, labs and discussed the procedure including the risks, benefits and alternatives for the proposed anesthesia with the patient or authorized representative who has indicated his/her understanding and acceptance.     Dental advisory given  Plan Discussed with: CRNA  Anesthesia Plan Comments:        Anesthesia Quick Evaluation

## 2024-04-06 NOTE — Discharge Summary (Signed)
 Postpartum Discharge Summary  Date of Service updated***     Patient Name: Janet Horn DOB: Feb 24, 2001 MRN: [id number]  Date of admission: 04/06/2024 Delivery date:04/06/2024 Delivering provider: ILEAN NORLEEN GAILS Date of discharge: 04/06/2024  Admitting diagnosis: Genital herpes affecting pregnancy in third trimester [O98.313, A60.09] Intrauterine pregnancy: [redacted]w[redacted]d     Secondary diagnosis:  Principal Problem:   Status post primary low transverse cesarean section Active Problems:   MDD (major depressive disorder), recurrent, severe, with psychosis (HCC)   History of drug use   HSV (herpes simplex virus) infection   History of gestational diabetes   Generalized anxiety disorder   Bipolar 2 disorder, major depressive episode (HCC)   Obesity during pregnancy, antepartum   Supervision of high risk pregnancy, antepartum   Hx IUFD at 35 wks   Poor fetal growth, affecting management of mother, antepartum condition or complication   Unwanted fertility   Clubfeet   Genital herpes affecting pregnancy in third trimester   Status post bilateral salpingectomy  Additional problems: ***    Discharge diagnosis: Term Pregnancy Delivered, GDM A1, and HSV outbreak                                              Post partum procedures: postpartum tubal ligation Augmentation: N/A Complications: None  Hospital course: 23 y.o. yo [id number] at [redacted]w[redacted]d was admitted to the hospital 04/06/2024 for scheduled cesarean section with the following indication: Malpresentation and Active HSV. Uncontrolled A1GDM (unwilling to check CBG and start medications). Delivery details are as follows:  Membrane Rupture Time/Date: 2:06 PM,04/06/2024  Delivery Method:C-Section, Low Transverse Operative Delivery:N/A Details of operation can be found in separate operative note.  Patient had a postpartum course complicated by***.  Received continued treatment for HSV outbreak. She is ambulating, tolerating a regular  diet, passing flatus, and urinating well. Patient is discharged home in stable condition on  04/06/24        Newborn Data: Birth date:04/06/2024 Birth time:2:06 PM Gender:Female Living status:Living Apgars:9 ,9  Weight:2990 g    Magnesium  Sulfate received: No BMZ received: No Rhophylac:No MMR:No T-DaP:Given prenatally Flu: No RSV Vaccine received: No Transfusion:{Transfusion received:30440034}  Immunizations received: Immunization History  Administered Date(s) Administered   Influenza,inj,Quad PF,6+ Mos 12/19/2021, 06/28/2022   Tdap 04/11/2022    Physical exam  Vitals:   04/06/24 1015  BP: 108/64  Pulse: 85  Resp: 16  Temp: (!) 97.4 F (36.3 C)  TempSrc: Axillary  SpO2: 96%  Weight: 89.9 kg  Height: 5' 2 (1.575 m)   General: {Exam; general:21111117} Lochia: {Desc; appropriate/inappropriate:30686::appropriate} Uterine Fundus: {Desc; firm/soft:30687} Incision: {Exam; incision:21111123} DVT Evaluation: {Exam; icu:7888877} Labs: Lab Results  Component Value Date   WBC 10.7 (H) 04/06/2024   HGB 11.2 (L) 04/06/2024   HCT 36.7 04/06/2024   MCV 74.1 (L) 04/06/2024   PLT 332 04/06/2024      Latest Ref Rng & Units 02/26/2024    8:00 PM  CMP  Glucose 70 - 99 mg/dL 891   BUN 6 - 20 mg/dL 5   Creatinine 9.55 - 8.99 mg/dL 9.42   Sodium 864 - 854 mmol/L 136   Potassium 3.5 - 5.1 mmol/L 3.7   Chloride 98 - 111 mmol/L 107   CO2 22 - 32 mmol/L 21   Calcium  8.9 - 10.3 mg/dL 8.7   Total Protein 6.5 - 8.1 g/dL  6.0   Total Bilirubin 0.0 - 1.2 mg/dL 0.4   Alkaline Phos 38 - 126 U/L 116   AST 15 - 41 U/L 14   ALT 0 - 44 U/L 15    Edinburgh Score:    08/06/2022    2:15 PM  Edinburgh Postnatal Depression Scale Screening Tool  I have been able to laugh and see the funny side of things. 0  I have looked forward with enjoyment to things. 0  I have blamed myself unnecessarily when things went wrong. 2  I have been anxious or worried for no good reason. 2  I have  felt scared or panicky for no good reason. 2  Things have been getting on top of me. 2  I have been so unhappy that I have had difficulty sleeping. 0  I have felt sad or miserable. 0  I have been so unhappy that I have been crying. 1  The thought of harming myself has occurred to me. 0  Edinburgh Postnatal Depression Scale Total 9      Data saved with a previous flowsheet row definition   No data recorded  After visit meds:  Allergies as of 04/06/2024       Reactions   Nitrous Oxide Other (See Comments)   siezures, per pt     Med Rec must be completed prior to using this [facility]***        Discharge home in stable condition Infant Feeding: {Baby feeding:23562} Infant Disposition:{CHL IP OB HOME WITH FNUYZM:76418} Discharge instruction: per After Visit Summary and Postpartum booklet. Activity: Advance as tolerated. Pelvic rest for 6 weeks.  Diet: {OB ipzu:78888878} Future Appointments: Future Appointments  Date Time Provider Department Center  04/08/2024  2:55 PM Jhonny Augustin BROCKS, MD [facility]  04/13/2024 10:00 AM [facility] PROVIDER 1 [facility]  04/13/2024 10:30 AM [facility] US4 [facility]  04/19/2024  3:15 PM Von Reasoner, MD [facility]  04/26/2024 10:55 AM Eldonna Suzen Octave, MD [facility]  05/03/2024  2:35 PM [facility] US2 [facility]  05/03/2024  3:15 PM Ilean Norleen GAILS, MD [facility]  05/10/2024  4:00 PM Ty Bernice RAMAN, [facility] None  05/11/2024 10:00 AM Nwoko, Reginia BRAVO, PA [facility] None   Follow up Visit:  Message sent to [facility] 6/24  Please schedule this patient for a In person postpartum visit in 6 weeks with the following provider: Any provider. Additional Postpartum F/U: 2 hour GTT and Incision check 1 week  High risk pregnancy complicated by: GDM, hx IUFD Delivery mode:  C-Section, Low Transverse Anticipated Birth Control:  BTL done [facility]   04/06/2024 Almarie CHRISTELLA Moats, MD

## 2024-04-07 ENCOUNTER — Encounter (HOSPITAL_COMMUNITY): Payer: Self-pay | Admitting: Family Medicine

## 2024-04-07 LAB — CBC
HCT: 31.9 % — ABNORMAL LOW (ref 36.0–46.0)
Hemoglobin: 10 g/dL — ABNORMAL LOW (ref 12.0–15.0)
MCH: 22.9 pg — ABNORMAL LOW (ref 26.0–34.0)
MCHC: 31.3 g/dL (ref 30.0–36.0)
MCV: 73.2 fL — ABNORMAL LOW (ref 80.0–100.0)
Platelets: 288 10*3/uL (ref 150–400)
RBC: 4.36 MIL/uL (ref 3.87–5.11)
RDW: 14.6 % (ref 11.5–15.5)
WBC: 9.6 10*3/uL (ref 4.0–10.5)
nRBC: 0 % (ref 0.0–0.2)

## 2024-04-07 LAB — GLUCOSE, CAPILLARY: Glucose-Capillary: 113 mg/dL — ABNORMAL HIGH (ref 70–99)

## 2024-04-07 MED ORDER — BUPIVACAINE IN DEXTROSE 0.75-8.25 % IT SOLN
INTRATHECAL | Status: DC | PRN
Start: 2024-04-06 — End: 2024-04-07
  Administered 2024-04-06: 1.8 mL via INTRATHECAL

## 2024-04-07 MED ORDER — FERROUS SULFATE 325 (65 FE) MG PO TABS
325.0000 mg | ORAL_TABLET | ORAL | Status: DC
  Administered 2024-04-07: 325 mg via ORAL
  Filled 2024-04-07: qty 1

## 2024-04-07 NOTE — Progress Notes (Signed)
 POSTPARTUM PROGRESS NOTE  POD #1  Subjective:  Janet Horn is a 23 y.o. [id number] s/p pLTCS at [redacted]w[redacted]d. No acute events overnight. She reports she is doing well. She denies any problems with ambulating, voiding or po intake. Denies nausea or vomiting. She has passed flatus. Pain is well controlled.  Lochia is appropriate.  Objective: Blood pressure (!) 97/59, pulse 83, temperature 97.7 F (36.5 C), temperature source Oral, resp. rate 16, height 5' 2 (1.575 m), weight 89.9 kg, SpO2 100%, unknown if currently breastfeeding.  Physical Exam:  General: alert, cooperative and no distress Chest: no respiratory distress Heart: regular rate, distal pulses intact Uterine Fundus: firm, appropriately tender DVT Evaluation: No calf swelling or tenderness Extremities: trace edema Skin: warm, dry; incision clean/dry/intact w/ clean honeycomb dressing in place  Recent Labs    04/06/24 1101 04/07/24 0511  HGB 11.2* 10.0*  HCT 36.7 31.9*    Assessment/Plan: Janet Horn is a 23 y.o. [id number] s/p pLTCS at [redacted]w[redacted]d for breech presentation and HSV outbreak.  POD#1 - Doing welll; pain well controlled. H/H appropriate  Routine postpartum care  OOB, ambulated  Lovenox for VTE prophylaxis  Start po ferrous sulfate  every other day HSV   Continue Valtrex   Contraception: s/p BTS intraop Feeding: breast  Dispo: Plan for discharge 6/26 vs 6/27 pending meeting goals.   LOS: 1 day   Augustin JAYSON Slade, MD OB Fellow  04/07/2024, 9:12 AM

## 2024-04-07 NOTE — Anesthesia Postprocedure Evaluation (Signed)
 Anesthesia Post Note  Patient: Janet Horn  Procedure(s) Performed: CESAREAN SECTION, WITH BILATERAL TUBAL LIGATION (Bilateral)     Patient location during evaluation: PACU Anesthesia Type: Spinal Level of consciousness: oriented and awake and alert Pain management: pain level controlled Vital Signs Assessment: post-procedure vital signs reviewed and stable Respiratory status: spontaneous breathing, respiratory function stable and patient connected to nasal cannula oxygen Cardiovascular status: blood pressure returned to baseline and stable Postop Assessment: no headache, no backache and no apparent nausea or vomiting Anesthetic complications: no  No notable events documented.  Last Vitals:  Vitals:   04/07/24 0009 04/07/24 0515  BP:  (!) 96/58  Pulse:  60  Resp: 18 17  Temp: 36.9 C 36.9 C  SpO2: 98% 98%    Last Pain:  Vitals:   04/07/24 0515  TempSrc: Oral  PainSc: 0-No pain                 Armando Lauman L Gee Habig

## 2024-04-07 NOTE — Anesthesia Procedure Notes (Signed)
 Spinal  Patient location during procedure: OR Start time: 04/06/2024 1:40 PM End time: 04/06/2024 1:43 PM Reason for block: surgical anesthesia Staffing Performed: anesthesiologist  Anesthesiologist: Niels Marien CROME, MD Performed by: Niels Marien CROME, MD Authorized by: Niels Marien CROME, MD   Preanesthetic Checklist Completed: patient identified, IV checked, risks and benefits discussed, surgical consent, monitors and equipment checked, pre-op evaluation and timeout performed Spinal Block Patient position: sitting Prep: DuraPrep and site prepped and draped Patient monitoring: cardiac monitor, continuous pulse ox and blood pressure Approach: midline Location: L3-4 Injection technique: single-shot Needle Needle type: Pencan  Needle gauge: 24 G Needle length: 9 cm Assessment Sensory level: T6 Events: CSF return Additional Notes Functioning IV was confirmed and monitors were applied. Sterile prep and drape, including hand hygiene and sterile gloves were used. The patient was positioned and the spine was prepped. The skin was anesthetized with lidocaine .  Free flow of clear CSF was obtained prior to injecting local anesthetic into the CSF.  The spinal needle aspirated freely following injection.  The needle was carefully withdrawn.  The patient tolerated the procedure well.
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
## 2024-04-07 NOTE — Lactation Note (Signed)
 This note was copied from a baby's chart. Lactation Consultation Note  Patient Name: Janet Horn Unijb'd Date: 04/07/2024 Age:23 hours Reason for consult: Follow-up assessment;1st time breastfeeding;Early term 37-38.6wks (hx HSV)  P2- MOB reports that infant had just finished feeding off of the Ultra Preemie bottle. MOB reports that she attempting to breastfeed her first, but she was too tired so they formula fed her. Per MOB, infant had fed for 30 minutes and drank a total of 16 mL. LC confirmed the amount by looking at the bottle. LC did not attempt to feed infant any longer because she had already ate for 30 minutes and we want to keep the feeding within 30 minutes total. LC asked MOB how pumping has been going. MOB reports that she has been pumping with the 24 mm flange, but she feels like they do not fit. At this time, MOB has been unable to collect anything. LC reassured MOB that it is normal to not have much come out for the first few days. LC stressed the importance of pumping every 3 hrs to help with the breast stimulation. LC sized MOB at an 18 mm flange and provided these to her. MOB pumped for 15 minutes and collected some drops of colostrum. LC praised her. LC encouraged MOB to call for further assistance as needed.  Maternal Data Does the patient have breastfeeding experience prior to this delivery?: No  Feeding Mother's Current Feeding Choice: Breast Milk and Formula Nipple Type: Dr. Jonna Fling Preemie  Lactation Tools Discussed/Used Tools: Pump;Flanges Flange Size: 18 Breast pump type: Double-Electric Breast Pump;Manual Pump Education: Setup, frequency, and cleaning;Milk Storage Reason for Pumping: poor feedings Pumping frequency: 15-20 min every 3 hrs Pumped volume:  (drops)  Interventions Interventions: Breast feeding basics reviewed;Hand pump;DEBP;Education;Pace feeding;LC Services brochure  Discharge Discharge Education: Engorgement and breast  care;Warning signs for feeding baby Pump: Hands Free;Personal  Consult Status Consult Status: Follow-up Date: 04/08/24 Follow-up type: In-patient    Recardo Hoit BS, IBCLC 04/07/2024, 8:45 PM

## 2024-04-07 NOTE — Lactation Note (Signed)
 This note was copied from a baby's chart. Lactation Consultation Note  Patient Name: Janet Horn Date: 04/07/2024 Age:23 hours Reason for consult: Follow-up assessment;Early term 37-38.6wks;Maternal endocrine disorder Mom had all ready BF baby when LC came in rm. LC asked if I could try giving the formula mom stated yes. LC used a yellow nipple first and baby appeared that flow was to fast. Baby making a lot of squeaking noises while feeding. LC would pull bottle out of mouth to rest. Baby in sitting up right position. White nipple applied to see if it was better. Flow wasn't as fast. Baby still makes noises during feeding on bottle. Baby has some congestion LC could feel it in her chest when [facility] holding her. Squeaking noise almost sounds as if it was coming from her nose. Nose appears clear. Asked RN to put in ST consult. LC feels that baby needs palate evaluated. LC was worried about aspiration. Baby would be suckling but could mainly see throat moving. Jaw moved very little. LC pulled out bottle frequently worried about flow and baby swallowing. No coughing noted. Baby appeared not to be in distress during eating. LC concerned about the swallowing.   Maternal Data Has patient been taught Hand Expression?: Yes  Feeding Mother's Current Feeding Choice: Breast Milk and Formula Nipple Type: Nfant Standard Flow (white)  LATCH Score Latch:  (mom didn't call LC for latch)                  Lactation Tools Discussed/Used    Interventions Interventions: Pace feeding;Education  Discharge    Consult Status Consult Status: Follow-up Date: 04/07/24 Follow-up type: In-patient    Rance Smithson G 04/07/2024, 1:47 AM

## 2024-04-08 ENCOUNTER — Other Ambulatory Visit (HOSPITAL_COMMUNITY): Payer: Self-pay

## 2024-04-08 ENCOUNTER — Encounter: Admitting: Family Medicine

## 2024-04-08 LAB — SURGICAL PATHOLOGY

## 2024-04-08 MED ORDER — WITCH HAZEL-GLYCERIN EX PADS
1.0000 | MEDICATED_PAD | CUTANEOUS | 12 refills | Status: AC | PRN
  Filled 2024-04-08 – 2024-04-09 (×2): qty 40, 30d supply, fill #0

## 2024-04-08 MED ORDER — ACETAMINOPHEN 500 MG PO TABS: 1000.0000 mg | ORAL_TABLET | Freq: Three times a day (TID) | ORAL | Status: DC

## 2024-04-08 MED ORDER — FERROUS SULFATE 325 (65 FE) MG PO TABS
325.0000 mg | ORAL_TABLET | ORAL | 3 refills | Status: AC
Start: 2024-04-09 — End: ?
  Filled 2024-04-08 – 2024-04-09 (×4): qty 30, 60d supply, fill #0

## 2024-04-08 MED ORDER — DIBUCAINE (PERIANAL) 1 % EX OINT: 1.0000 | TOPICAL_OINTMENT | CUTANEOUS | Status: AC | PRN

## 2024-04-08 MED ORDER — COCONUT OIL OIL: 1.0000 | TOPICAL_OIL | Status: AC | PRN

## 2024-04-08 MED ORDER — IBUPROFEN 800 MG PO TABS
800.0000 mg | ORAL_TABLET | Freq: Three times a day (TID) | ORAL | 0 refills | Status: AC
  Filled 2024-04-08: qty 90, 30d supply, fill #0
  Filled 2024-04-09: qty 63, 21d supply, fill #0
  Filled 2024-04-09: qty 90, 30d supply, fill #0
  Filled 2024-04-09: qty 27, 9d supply, fill #0
  Filled 2024-04-09: qty 90, 30d supply, fill #0

## 2024-04-08 MED ORDER — SENNOSIDES-DOCUSATE SODIUM 8.6-50 MG PO TABS
2.0000 | ORAL_TABLET | Freq: Every day | ORAL | 1 refills | Status: AC
  Filled 2024-04-08 – 2024-04-09 (×5): qty 30, 15d supply, fill #0

## 2024-04-08 MED ORDER — OXYCODONE HCL 5 MG PO TABS
5.0000 mg | ORAL_TABLET | ORAL | 0 refills | Status: DC | PRN
  Filled 2024-04-08 – 2024-04-09 (×4): qty 20, 2d supply, fill #0

## 2024-04-08 MED ORDER — GABAPENTIN 100 MG PO CAPS
100.0000 mg | ORAL_CAPSULE | Freq: Three times a day (TID) | ORAL | 0 refills | Status: DC
  Filled 2024-04-08 – 2024-04-09 (×4): qty 10, 4d supply, fill #0

## 2024-04-08 NOTE — Lactation Note (Addendum)
 This note was copied from a baby's chart. Lactation Consultation Note  Patient Name: Janet Horn Unijb'd Date: 04/08/2024 Age:23 hours Reason for consult: Follow-up assessment  P1, Baby 37 weeks. Mother states baby has been sleepy and has not woken to feed. Reviewed early term feeding behavior and expectations. Changed baby's diaper and baby was alert and calm. Mother hand expressed drops.  Assisted with latching in cradle hold on the right breast.   Mother compressed breast to keep her active.  Baby sustained latch for 8 minutes with good depth and intermittent swallows before tiring. Burped baby. Assisted with feeding formula using Dr. Orlinda Ultra Preemie nipple. Noted hard swallows intermittently with paced feeding on her side. Baby consumed 20 ml.. Encouraged mother to pump q 3 hours for 15 min.    Maternal Data Has patient been taught Hand Expression?: Yes  Feeding Mother's Current Feeding Choice: Breast Milk and Formula Nipple Type: Dr. Jonna Fling Preemie  LATCH Score Latch: Repeated attempts needed to sustain latch, nipple held in mouth throughout feeding, stimulation needed to elicit sucking reflex.  Audible Swallowing: A few with stimulation  Type of Nipple: Everted at rest and after stimulation  Comfort (Breast/Nipple): Soft / non-tender  Hold (Positioning): Assistance needed to correctly position infant at breast and maintain latch.  LATCH Score: 7   Lactation Tools Discussed/Used    Interventions Interventions: Pace feeding  Discharge    Consult Status Consult Status: Follow-up Date: 04/09/24 Follow-up type: In-patient    Shannon Dines [facility] 04/08/2024, 2:09 PM

## 2024-04-08 NOTE — Lactation Note (Signed)
 This note was copied from a baby's chart. Lactation Consultation Note  Patient Name: Janet Horn Date: 04/08/2024 Age:23 hours Reason for consult: Follow-up assessment;1st time breastfeeding  P1, 37 weeks.  Mother states baby was originally breastfeeding well on the first day and since then has been sleepy. Provided education regarding LPI/early term feeding behavior. Encouraged mother to call when baby cues for [facility] to assist with the next feeding. Also suggest mother pump to stimulate her supply and discussed volume expectations.   Maternal Data Has patient been taught Hand Expression?: Yes  Feeding Mother's Current Feeding Choice: Breast Milk and Formula Nipple Type: Dr. Jonna Fling Preemie  Interventions Interventions: Education Consult Status Consult Status: Follow-up Date: 04/08/24 Follow-up type: In-patient   Shannon Levorn Lemme  RN, IBCLC 04/08/2024, 12:12 PM

## 2024-04-09 ENCOUNTER — Other Ambulatory Visit (HOSPITAL_COMMUNITY): Payer: Self-pay

## 2024-04-09 ENCOUNTER — Other Ambulatory Visit: Payer: Self-pay

## 2024-04-10 ENCOUNTER — Inpatient Hospital Stay (HOSPITAL_COMMUNITY)

## 2024-04-10 ENCOUNTER — Encounter: Payer: Self-pay | Admitting: Family Medicine

## 2024-04-13 ENCOUNTER — Ambulatory Visit

## 2024-04-15 ENCOUNTER — Encounter: Payer: Self-pay | Admitting: Family Medicine

## 2024-04-15 ENCOUNTER — Other Ambulatory Visit: Payer: Self-pay

## 2024-04-15 ENCOUNTER — Encounter: Payer: Self-pay | Admitting: General Practice

## 2024-04-15 ENCOUNTER — Ambulatory Visit: Admitting: General Practice

## 2024-04-15 VITALS — BP 105/67 | HR 72 | Ht 62.0 in | Wt 188.0 lb

## 2024-04-15 DIAGNOSIS — Z5189 Encounter for other specified aftercare: Secondary | ICD-10-CM

## 2024-04-15 NOTE — Addendum Note (Signed)
 Addended by: INOCENTE ELENOR CROME on: 04/15/2024 05:32 PM   Modules accepted: Level of Service

## 2024-04-15 NOTE — Progress Notes (Signed)
 Patient presents to office for wound check following primary c-section on 6/24. She reports doing well since then except in the past few days she has developed a really painful milk duct. She states she removed her incision dressing on Sunday. Incision is clean, dry & intact- appears to be healing well. Reviewed wound care and signs & symptoms of infection with patient. Patient will follow up at pp visit.   Elenor DEL RN BSN 04/15/24

## 2024-04-15 NOTE — Progress Notes (Signed)
 LC to room per RN request due to patient having pain in her left breast. LC educated parent to not use heat and not to pump every hour. LC encouraged parent to use cold compress, while taking ibuprofen , use feather like motion upward on left breast while laying back, and using sunflower lecithin 1200 mg three times a day. Reviewed when to call for help or go to [facility] if symptoms worsen or don't improve in 48 hours. Patient voiced understanding.

## 2024-04-19 ENCOUNTER — Other Ambulatory Visit (HOSPITAL_COMMUNITY): Payer: Self-pay

## 2024-04-19 ENCOUNTER — Encounter: Admitting: Family Medicine

## 2024-04-21 ENCOUNTER — Ambulatory Visit (HOSPITAL_COMMUNITY): Admitting: Mental Health

## 2024-04-26 ENCOUNTER — Encounter: Admitting: Family Medicine

## 2024-05-03 ENCOUNTER — Other Ambulatory Visit

## 2024-05-03 ENCOUNTER — Encounter: Admitting: Family Medicine

## 2024-05-10 ENCOUNTER — Ambulatory Visit (INDEPENDENT_AMBULATORY_CARE_PROVIDER_SITE_OTHER): Admitting: Mental Health

## 2024-05-10 DIAGNOSIS — F3181 Bipolar II disorder: Secondary | ICD-10-CM | POA: Diagnosis not present

## 2024-05-10 DIAGNOSIS — F4310 Post-traumatic stress disorder, unspecified: Secondary | ICD-10-CM
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
## 2024-05-11 ENCOUNTER — Encounter (HOSPITAL_COMMUNITY): Payer: Self-pay | Admitting: Physician Assistant

## 2024-05-11 ENCOUNTER — Other Ambulatory Visit (HOSPITAL_BASED_OUTPATIENT_CLINIC_OR_DEPARTMENT_OTHER): Payer: Self-pay

## 2024-05-11 ENCOUNTER — Other Ambulatory Visit: Payer: Self-pay

## 2024-05-11 ENCOUNTER — Telehealth (HOSPITAL_COMMUNITY): Admitting: Physician Assistant

## 2024-05-11 ENCOUNTER — Other Ambulatory Visit (HOSPITAL_COMMUNITY): Payer: Self-pay

## 2024-05-11 DIAGNOSIS — F3181 Bipolar II disorder: Secondary | ICD-10-CM | POA: Diagnosis not present

## 2024-05-11 DIAGNOSIS — F411 Generalized anxiety disorder: Secondary | ICD-10-CM | POA: Diagnosis not present

## 2024-05-11 MED ORDER — HYDROXYZINE PAMOATE 25 MG PO CAPS
25.0000 mg | ORAL_CAPSULE | Freq: Two times a day (BID) | ORAL | 3 refills | Status: DC | PRN
  Filled 2024-05-11: qty 60, 15d supply, fill #0

## 2024-05-11 MED ORDER — ARIPIPRAZOLE 5 MG PO TABS
5.0000 mg | ORAL_TABLET | Freq: Every day | ORAL | 0 refills | Status: DC
  Filled 2024-05-11: qty 6, 6d supply, fill #0

## 2024-05-11 MED ORDER — ARIPIPRAZOLE 10 MG PO TABS
10.0000 mg | ORAL_TABLET | Freq: Every day | ORAL | 2 refills | Status: DC
  Filled 2024-05-11: qty 30, 30d supply, fill #0
  Filled 2024-06-19: qty 30, 30d supply, fill #1
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
## 2024-05-12 ENCOUNTER — Other Ambulatory Visit (HOSPITAL_COMMUNITY): Payer: Self-pay

## 2024-05-12 ENCOUNTER — Ambulatory Visit: Admitting: Family Medicine

## 2024-05-12 NOTE — Progress Notes (Signed)
 Post Partum Visit Note  Janet Horn is a 23 y.o. G60P2102 female who presents for a postpartum visit. She is 5 weeks postpartum following a low transverse c-section.  I have fully reviewed the prenatal and intrapartum course. The delivery was at 37 gestational weeks.  Anesthesia: spinal. Postpartum course has been uncomplicated . Baby is doing well. Baby is feeding by bottle - enfamil neuropro. Bleeding no bleeding. Bowel function is normal. Bladder function is normal. Patient is sexually active. Contraception method is BTL. Postpartum depression screening: negative.   The pregnancy intention screening data noted above was reviewed. Potential methods of contraception were discussed. The patient elected to proceed with No data recorded.   Edinburgh Postnatal Depression Scale - 05/12/24 0921       Edinburgh Postnatal Depression Scale:  In the Past 7 Days   I have been able to laugh and see the funny side of things. 0    I have looked forward with enjoyment to things. 0    I have blamed myself unnecessarily when things went wrong. 2    I have been anxious or worried for no good reason. 0    I have felt scared or panicky for no good reason. 0    Things have been getting on top of me. 0    I have been so unhappy that I have had difficulty sleeping. 0    I have felt sad or miserable. 2    I have been so unhappy that I have been crying. 0    The thought of harming myself has occurred to me. 0    Edinburgh Postnatal Depression Scale Total 4          Health Maintenance Due  Topic Date Due   HPV VACCINES (1 - 3-dose series) Never done   Meningococcal B Vaccine (1 of 2 - Standard) Never done   COVID-19 Vaccine (1 - 2024-25 season) Never done    The following portions of the patient's history were reviewed and updated as appropriate: allergies, current medications, past family history, past medical history, past social history, past surgical history, and problem  list.  Review of Systems Pertinent items noted in HPI and remainder of comprehensive ROS otherwise negative.  Objective:  BP 111/74   Pulse (!) 121   Wt 180 lb (81.6 kg)   LMP  (LMP Unknown) Comment: Nexplanon  removed ? sometime in Oct, never had period  BMI 32.92 kg/m    General:  alert, cooperative, and appears stated age   Breasts:  not indicated  Lungs: Comfortalbe on room air  Wound well approximated incision  GU exam:  not indicated        Assessment:   No diagnosis found.  Normal postpartum exam.   Plan:   Essential components of care per ACOG recommendations:  1.  Mood and well being: Patient with negative depression screening today. Seeing psychiatry, started abilify . Reviewed local resources for support.  - Patient tobacco use? No.   - hx of drug use? No.    2. Infant care and feeding:  -Patient currently breastmilk feeding? No.  -Social determinants of health (SDOH) reviewed in EPIC. No concerns  3. Sexuality, contraception and birth spacing - Patient does not want a pregnancy in the next year.  Desired family size is 2 children.  - Reviewed reproductive life planning. BTL during surgery  - Discussed birth spacing of 18 months  4. Sleep and fatigue -Encouraged family/partner/community support of 4 hrs of  uninterrupted sleep to help with mood and fatigue  5. Physical Recovery  - Discussed patients delivery and complications. She describes her labor as good. - Patient had a C-section. Patient expressed understanding - Patient has urinary incontinence? No. - Patient is safe to resume physical and sexual activity  6.  Health Maintenance - HM due items addressed Yes - Last pap smear  Diagnosis  Date Value Ref Range Status  08/06/2022   Final   - Negative for Intraepithelial Lesions or Malignancy (NILM)  08/06/2022 - Benign reactive/reparative changes  Final   Pap smear not done at today's visit.  -Breast Cancer screening indicated? No.   7. Chronic  Disease/Pregnancy Condition follow up: None No diagnosis found.  - PCP follow up   Norleen LULLA Rover, MD Attending Family Medicine Physician, [facility] for [facility], [facility]

## 2024-05-13 ENCOUNTER — Encounter: Payer: Self-pay | Admitting: *Deleted

## 2024-05-18 ENCOUNTER — Other Ambulatory Visit: Payer: Self-pay

## 2024-05-18 DIAGNOSIS — O24419 Gestational diabetes mellitus in pregnancy, unspecified control: Secondary | ICD-10-CM

## 2024-05-21 ENCOUNTER — Other Ambulatory Visit: Payer: Self-pay

## 2024-05-21 ENCOUNTER — Ambulatory Visit: Admitting: Family Medicine

## 2024-05-21 ENCOUNTER — Ambulatory Visit: Admitting: Obstetrics & Gynecology

## 2024-05-21 ENCOUNTER — Other Ambulatory Visit

## 2024-05-21 DIAGNOSIS — O24419 Gestational diabetes mellitus in pregnancy, unspecified control: Secondary | ICD-10-CM

## 2024-05-22 LAB — GLUCOSE TOLERANCE, 2 HOURS
Glucose, 2 hour: 95 mg/dL (ref 70–139)
Glucose, GTT - Fasting: 92 mg/dL (ref 70–99)

## 2024-05-31 ENCOUNTER — Ambulatory Visit (INDEPENDENT_AMBULATORY_CARE_PROVIDER_SITE_OTHER): Admitting: Mental Health

## 2024-05-31 DIAGNOSIS — F3181 Bipolar II disorder: Secondary | ICD-10-CM

## 2024-05-31 DIAGNOSIS — F4310 Post-traumatic stress disorder, unspecified: Secondary | ICD-10-CM

## 2024-05-31 NOTE — Progress Notes (Unsigned)
 THERAPIST PROGRESS NOTE Virtual Visit via Video Note  I connected with Janet Horn on 05/31/24 at  4:00 PM EDT by a video enabled telemedicine application and verified that I am speaking with the correct person using two identifiers.  Location: Patient: home address on file Provider: home office   I discussed the limitations of evaluation and management by telemedicine and the availability of in person appointments. The patient expressed understanding and agreed to proceed.  I discussed the assessment and treatment plan with the patient. The patient was provided an opportunity to ask questions and all were answered. The patient agreed with the plan and demonstrated an understanding of the instructions.   The patient was advised to call back or seek an in-person evaluation if the symptoms worsen or if the condition fails to improve as anticipated.  I provided 40 minutes of non-face-to-face time during this encounter.   Ty Bernice Savant, [facility]   Session Time: 4:00 pm (   Participation Level: Active  Behavioral Response: CasualAlertDysphoric  Type of Therapy: Individual Therapy Treatment Goals addressed: STG: Janet Kaylene will increase management of moods AEB daily medication management with a ability to engage in self-care and coping skills daily within the next 90 days   ProgressTowards Goals: Progressing  Interventions: CBT and Supportive  Summary:  Janet Horn is a 23 y.o. female who presents with dx of bipolar II and PTSD. Janet presents for session alert and oriented; mood and affect low, dyphoric. Speech clear and coherent at normal rate and tone. Notes for moods to have been low with feelings of numbness. Reports to have been spending high degree of time in her room in bed with isolation behaviors and avoiding interaction with others. Shares thoughts on difficulties raising x 2 children, financial stressors(not currently receiving  disability check) and current discord with mother. Shares events of mother speaking down to her after her asking for diapers for her children. Shares thoughts of not wanting to ask for help as others state they will but when she asks they think ill of her. Shares thoughts of hx being in foster care and feeling alone and constant desire for mother's approval. Explores with therapist working to set appropriate expectations and emotional boundaries with mother. Explores engagement in self-care. Shares to be medication compliant. Shares some idle suicidal thoughts at times but denies plan or desire with children to be a protective factor.   Video connection ends - therapist in need of stepping away for a minute and upon return pt was no longer connected. Therapist waited x 10 minutes as well as contacted pt. Via telephone x 2. Left message to remind of upcoming appointment.   Suicidal/Homicidal: Nowithout intent/plan  Therapist Response: Therapist engaged Janet in tele-therapy session. Completed check in and assessed for current level of functioning; sxs management and level of stressors. Supported Janet in processing current concerns and processing through thoughts and feelings of having x 2 children and interactions with mother. Provided support and encouragement; validated feelings. Supported Janet in processing thoughts and hx of mother and working to establish better emotional boundaries with mother. Explored working to expect mother to behave in the manner in which she has always behaved. Assessed for safety concerns and medication compliance. Explored use of coping skills and self-care and encouraged behavioral activation and communication with partner concerning needed breaks.   Plan: Return again in  x 4 weeks.  Diagnosis: Bipolar 2 disorder, major depressive episode (HCC)  PTSD (post-traumatic stress disorder)  Collaboration  of Care: Other None  Patient/Guardian was advised Release of  Information must be obtained prior to any record release in order to collaborate their care with an outside provider. Patient/Guardian was advised if they have not already done so to contact the registration department to sign all necessary forms in order for us  to release information regarding their care.   Consent: Patient/Guardian gives verbal consent for treatment and assignment of benefits for services provided during this visit. Patient/Guardian expressed understanding and agreed to proceed.   Ty Asal [place], [facility] 05/31/2024

## 2024-06-21 ENCOUNTER — Other Ambulatory Visit (HOSPITAL_COMMUNITY): Payer: Self-pay

## 2024-06-21 ENCOUNTER — Other Ambulatory Visit: Payer: Self-pay

## 2024-06-28 ENCOUNTER — Ambulatory Visit (HOSPITAL_COMMUNITY): Admitting: Mental Health

## 2024-06-28 DIAGNOSIS — F3181 Bipolar II disorder: Secondary | ICD-10-CM | POA: Diagnosis not present

## 2024-06-28 DIAGNOSIS — F4310 Post-traumatic stress disorder, unspecified: Secondary | ICD-10-CM

## 2024-06-28 NOTE — Progress Notes (Unsigned)
 THERAPIST PROGRESS NOTE Virtual Visit via Video Note  I connected with Janet Horn on 06/28/24 at  3:00 PM EDT by a video enabled telemedicine application and verified that I am speaking with the correct person using two identifiers.  Location: Patient: home address on file Provider: remote office   I discussed the limitations of evaluation and management by telemedicine and the availability of in person appointments. The patient expressed understanding and agreed to proceed.  I discussed the assessment and treatment plan with the patient. The patient was provided an opportunity to ask questions and all were answered. The patient agreed with the plan and demonstrated an understanding of the instructions.   The patient was advised to call back or seek an in-person evaluation if the symptoms worsen or if the condition fails to improve as anticipated.  I provided 28  minutes of non-face-to-face time during this encounter.   Ty Bernice Savant, [facility]   Session Time: 3:33 pm ( 28 minutes)  Participation Level: Active  Behavioral Response: CasualAlertDepressed and Dysphoric  Type of Therapy: Individual Therapy  Treatment Goals addressed:  STG: Janet Kaylene will increase management of moods AEB daily medication management with a ability to engage in self-care and coping skills daily within the next 90 days   ProgressTowards Goals: Progressing- regressed  Interventions: CBT and Supportive  Summary:  Janet Horn is a 23 y.o. female who presents with dx of bipolar II and PTSD. Janet presents for session alert and oriented; mood and affect low, dyphoric. Speech clear and coherent at normal rate and tone. Notes for moods to have been low with feelings of numbness. Session started x 30 minutes late with pt not currently in home at time of session. Reports to have broken up with boyfriend of x 3 years and shares concerns for his drinking and for him to be  older than her. Shares thoughts of not being happy in the relationship and abruptly ended it and started relationship with best friend, however he broke up with her several days into the relationship. Shares feelings with therapist and explores working to gain stability in the community independently and working to focus on her self vs. A relationship. Shares increase in depressive sxs with suicidal thoughts with use of suicide hotline. Shares future goals and denies intent or plan to act on suicidal thoughts and denies current suicidal thoughts.   Suicidal/Homicidal: Nowithout intent/plan  Therapist Response: Therapist engaged Janet in tele-therapy session. Completed check in and assessed for current level of functioning; sxs management and level of stressors. Supported Janet in processing current concerns and processing through thoughts and feelings on recent events and relationships. Assessed for safety and provided support and encouragement; reminded of crisis resources. Encouraged Reana to work to engage in self-care and review of choices and delaying impulses. Reminded of upcoming medication management appointment; assessed for safety.   Plan: Return again in  x 4 weeks.  Diagnosis: Bipolar 2 disorder, major depressive episode (HCC)  PTSD (post-traumatic stress disorder)  Collaboration of Care: Other None  Patient/Guardian was advised Release of Information must be obtained prior to any record release in order to collaborate their care with an outside provider. Patient/Guardian was advised if they have not already done so to contact the registration department to sign all necessary forms in order for us  to release information regarding their care.   Consent: Patient/Guardian gives verbal consent for treatment and assignment of benefits for services provided during this visit. Patient/Guardian expressed understanding and  agreed to proceed.   Ty Asal [place], [facility] 06/28/2024

## 2024-06-29 ENCOUNTER — Telehealth (HOSPITAL_COMMUNITY): Admitting: Psychiatry

## 2024-06-29 ENCOUNTER — Encounter (HOSPITAL_COMMUNITY): Payer: Self-pay | Admitting: Psychiatry

## 2024-06-29 ENCOUNTER — Other Ambulatory Visit: Payer: Self-pay

## 2024-06-29 ENCOUNTER — Other Ambulatory Visit (HOSPITAL_COMMUNITY): Payer: Self-pay

## 2024-06-29 DIAGNOSIS — F411 Generalized anxiety disorder: Secondary | ICD-10-CM

## 2024-06-29 DIAGNOSIS — F3181 Bipolar II disorder: Secondary | ICD-10-CM

## 2024-06-29 MED ORDER — HYDROXYZINE HCL 25 MG PO TABS
25.0000 mg | ORAL_TABLET | Freq: Four times a day (QID) | ORAL | 0 refills | Status: DC
  Filled 2024-06-29: qty 12, 3d supply, fill #0

## 2024-06-29 MED ORDER — HYDROXYZINE HCL 25 MG PO TABS
25.0000 mg | ORAL_TABLET | Freq: Four times a day (QID) | ORAL | 3 refills | Status: DC
  Filled 2024-06-29: qty 90, 23d supply, fill #0
  Filled 2024-07-20 (×2): qty 90, 23d supply, fill #1

## 2024-06-29 MED ORDER — LAMOTRIGINE 25 MG PO TABS
25.0000 mg | ORAL_TABLET | Freq: Every day | ORAL | 1 refills | Status: DC
  Filled 2024-06-29: qty 90, 44d supply, fill #0
  Filled 2024-07-20: qty 90, 44d supply, fill #1
  Filled 2024-07-23: qty 90, 30d supply, fill #1
  Filled 2024-07-23 – 2024-08-01 (×2): qty 90, 44d supply, fill #1

## 2024-06-29 MED ORDER — ARIPIPRAZOLE 10 MG PO TABS
10.0000 mg | ORAL_TABLET | Freq: Every day | ORAL | 2 refills | Status: DC
  Filled 2024-06-29 – 2024-07-20 (×3): qty 30, 30d supply, fill #0
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
## 2024-07-08 ENCOUNTER — Encounter: Payer: Self-pay | Admitting: Family Medicine

## 2024-07-12 ENCOUNTER — Encounter: Payer: Self-pay | Admitting: Family Medicine

## 2024-07-12 ENCOUNTER — Inpatient Hospital Stay (HOSPITAL_COMMUNITY)
Admission: AD | Admit: 2024-07-12 | Discharge: 2024-07-12 | Disposition: A | Discharge status: Home / Self Care | Attending: Family Medicine | Admitting: Family Medicine

## 2024-07-12 DIAGNOSIS — O26892 Other specified pregnancy related conditions, second trimester: Secondary | ICD-10-CM

## 2024-07-12 DIAGNOSIS — Z9851 Tubal ligation status: Secondary | ICD-10-CM | POA: Diagnosis not present

## 2024-07-12 DIAGNOSIS — Z789 Other specified health status: Secondary | ICD-10-CM | POA: Diagnosis not present

## 2024-07-12 DIAGNOSIS — Z3201 Encounter for pregnancy test, result positive: Secondary | ICD-10-CM | POA: Diagnosis present

## 2024-07-12 DIAGNOSIS — R109 Unspecified abdominal pain: Secondary | ICD-10-CM | POA: Diagnosis present

## 2024-07-12 DIAGNOSIS — Z3A00 Weeks of gestation of pregnancy not specified: Secondary | ICD-10-CM

## 2024-07-12 DIAGNOSIS — R112 Nausea with vomiting, unspecified: Secondary | ICD-10-CM | POA: Diagnosis present

## 2024-07-12 LAB — HCG, QUANTITATIVE, PREGNANCY: hCG, Beta Chain, Quant, S: <1 m[IU]/mL (ref <5)

## 2024-07-12 NOTE — MAU Note (Signed)
 Janet Horn is a 23 y.o. at Unknown here in MAU reporting: been having n/v for about a week. Took home pregnancy test over the weekend and it was positve. C/o abd pain for the past few days and started bleeding today .   LMP: End of August Onset of complaint: 1 week  Pain score: 10 Vitals:   07/12/24 1834  BP: 129/72  Pulse: 90  Resp: 18  Temp: 98.4 F (36.9 C)     FHT:   Lab orders placed from triage: HCG

## 2024-07-12 NOTE — Telephone Encounter (Signed)
 Patient appointment scheduled virtually

## 2024-07-12 NOTE — Discharge Instructions (Signed)
 You were seen in the MAU for abdominal pain. We did a blood test that shows you are not pregnant. Your abdominal pain could be due to multiple causes, possibly even just your normal menstrual cycle. We recommend conservative treatment at home with over the counter medicines, but you can seek care at the main ED if desired.   I will send a message to schedule a follow up visit at the [facility].
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
## 2024-07-12 NOTE — Telephone Encounter (Signed)
 Can someone reach out to patient, she is requesting an appointment but states she is unable to get through when calling.

## 2024-07-15 ENCOUNTER — Telehealth

## 2024-07-20 ENCOUNTER — Other Ambulatory Visit (HOSPITAL_COMMUNITY): Payer: Self-pay

## 2024-07-21 ENCOUNTER — Other Ambulatory Visit: Payer: Self-pay

## 2024-07-21 ENCOUNTER — Other Ambulatory Visit (HOSPITAL_COMMUNITY): Payer: Self-pay

## 2024-07-23 ENCOUNTER — Other Ambulatory Visit (HOSPITAL_COMMUNITY): Payer: Self-pay

## 2024-07-29 ENCOUNTER — Encounter (HOSPITAL_COMMUNITY): Payer: Self-pay

## 2024-07-29 ENCOUNTER — Ambulatory Visit (HOSPITAL_COMMUNITY): Admitting: Mental Health

## 2024-07-29 NOTE — Progress Notes (Deleted)
   THERAPIST PROGRESS NOTE  Session Time: ***  Participation Level: {BHH PARTICIPATION LEVEL:22264}  Behavioral Response: {Appearance:22683}{BHH LEVEL OF CONSCIOUSNESS:22305}{BHH MOOD:22306}  Type of Therapy: {CHL AMB BH Type of Therapy:21022741}  Treatment Goals addressed: ***  ProgressTowards Goals: {Progress Towards Goals:21014066}  Interventions: {CHL AMB BH Type of Intervention:21022753}  Summary: Janet Horn is a 23 y.o. female who presents with ***.   Suicidal/Homicidal: {BHH YES OR NO:22294}{yes/no/with/without intent/plan:22693}  Therapist Response: ***  Plan: Return again in *** weeks.  Diagnosis: No diagnosis found.  Collaboration of Care: {BH OP Collaboration of Care:21014065}  Patient/Guardian was advised Release of Information must be obtained prior to any record release in order to collaborate their care with an outside provider. Patient/Guardian was advised if they have not already done so to contact the registration department to sign all necessary forms in order for us  to release information regarding their care.   Consent: Patient/Guardian gives verbal consent for treatment and assignment of benefits for services provided during this visit. Patient/Guardian expressed understanding and agreed to proceed.   Ty Asal [place], [facility] [address] 07/29/2024

## 2024-08-03 ENCOUNTER — Encounter (HOSPITAL_COMMUNITY): Payer: Self-pay | Admitting: Psychiatry

## 2024-08-03 ENCOUNTER — Other Ambulatory Visit (HOSPITAL_COMMUNITY): Payer: Self-pay

## 2024-08-03 ENCOUNTER — Telehealth (INDEPENDENT_AMBULATORY_CARE_PROVIDER_SITE_OTHER): Admitting: Psychiatry

## 2024-08-03 DIAGNOSIS — F411 Generalized anxiety disorder: Secondary | ICD-10-CM | POA: Diagnosis not present

## 2024-08-03 DIAGNOSIS — F3181 Bipolar II disorder: Secondary | ICD-10-CM | POA: Diagnosis not present

## 2024-08-03 MED ORDER — LAMOTRIGINE 25 MG PO TABS
25.0000 mg | ORAL_TABLET | Freq: Every day | ORAL | 1 refills | Status: DC
  Filled 2024-08-21: qty 90, 30d supply, fill #0

## 2024-08-03 MED ORDER — ARIPIPRAZOLE 10 MG PO TABS
10.0000 mg | ORAL_TABLET | Freq: Every day | ORAL | 2 refills | Status: DC
Start: 2024-08-03 — End: 2024-09-03
  Filled 2024-08-03 – 2024-08-21 (×2): qty 30, 30d supply, fill #0

## 2024-08-03 MED ORDER — HYDROXYZINE HCL 25 MG PO TABS
25.0000 mg | ORAL_TABLET | Freq: Four times a day (QID) | ORAL | 3 refills | Status: DC
Start: 2024-08-03 — End: 2024-09-03
  Filled 2024-08-03 – 2024-08-21 (×2): qty 90, 23d supply, fill #0
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
## 2024-08-05 ENCOUNTER — Ambulatory Visit (INDEPENDENT_AMBULATORY_CARE_PROVIDER_SITE_OTHER): Admitting: Mental Health

## 2024-08-05 DIAGNOSIS — F3181 Bipolar II disorder: Secondary | ICD-10-CM | POA: Diagnosis not present

## 2024-08-05 DIAGNOSIS — F411 Generalized anxiety disorder: Secondary | ICD-10-CM

## 2024-08-05 NOTE — Progress Notes (Signed)
   THERAPIST PROGRESS NOTE Virtual Visit via Video Note  I connected with Janet Horn on 08/05/24 at 11:00 AM EDT by a video enabled telemedicine application and verified that I am speaking with the correct person using two identifiers.  Location: Patient: mother's home- [facility] Provider: office    I discussed the limitations of evaluation and management by telemedicine and the availability of in person appointments. The patient expressed understanding and agreed to proceed.  I discussed the assessment and treatment plan with the patient. The patient was provided an opportunity to ask questions and all were answered. The patient agreed with the plan and demonstrated an understanding of the instructions.   The patient was advised to call back or seek an in-person evaluation if the symptoms worsen or if the condition fails to improve as anticipated.  I provided 41 minutes of non-face-to-face time during this encounter.   Ty Bernice Savant, [facility]   Session Time: 11:05am ( 41 minutes)  Participation Level: Active  Behavioral Response: CasualAlertDysphoric  Type of Therapy: Individual Therapy  Treatment Goals addressed:  STG: Janet Horn will increase management of moods AEB daily medication management with a ability to engage in self-care and coping skills daily within the next 90 days   ProgressTowards Goals: Progressing- regressed  Interventions: CBT and Supportive  Summary:  Janet Horn is a 23 y.o. female who presents with dx of bipolar II and PTSD. Janet presents for session alert and oriented; mood and affect low, dyphoric. Speech clear and coherent at normal rate and tone. Notes chief complaint of  a little down. I ran away 2 weeks ago. Shares feelings of being overwhelmed with moving into mother's home and also states to have started working as well. Engages with therapist and identifies various psychosocial stressors and difficulty  managing. Notes to have also stopped taking medications but has restarted. Engages with therapist in processing thoughts and feelings and working to regain stability in community. Shares plans to work to increase balance in days and ability to utilize coping and engineer, structural. Denies safety concerns.   Suicidal/Homicidal: Nowithout intent/plan  Therapist Response: Therapist engaged Janet in tele-therapy session. Completed check in and assessed for current level of functioning; sxs management and level of stressors. Supported Janet in processing current concerns and processing through thoughts and feelings on recent increase in sxs. Educated on importance of medication management and supported in processing emotions. Reviewed use of skills and presence of supports in daily life. Reviewed relaxation and emotional reglulation coping skills. Reviewed session and provided follow up.   Plan: Return again in  x 5 weeks.  Diagnosis: Bipolar 2 disorder, major depressive episode (HCC)  Generalized anxiety disorder  Collaboration of Care: Other none  Patient/Guardian was advised Release of Information must be obtained prior to any record release in order to collaborate their care with an outside provider. Patient/Guardian was advised if they have not already done so to contact the registration department to sign all necessary forms in order for us  to release information regarding their care.   Consent: Patient/Guardian gives verbal consent for treatment and assignment of benefits for services provided during this visit. Patient/Guardian expressed understanding and agreed to proceed.   Ty Bernice Bailey, [facility] 08/05/2024

## 2024-08-12 ENCOUNTER — Other Ambulatory Visit (HOSPITAL_COMMUNITY): Payer: Self-pay

## 2024-08-21 ENCOUNTER — Other Ambulatory Visit (HOSPITAL_COMMUNITY): Payer: Self-pay
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
## 2024-09-03 ENCOUNTER — Telehealth (HOSPITAL_COMMUNITY): Admitting: Psychiatry

## 2024-09-03 ENCOUNTER — Encounter (HOSPITAL_COMMUNITY): Payer: Self-pay | Admitting: Psychiatry

## 2024-09-03 ENCOUNTER — Other Ambulatory Visit: Payer: Self-pay

## 2024-09-03 ENCOUNTER — Other Ambulatory Visit (HOSPITAL_COMMUNITY): Payer: Self-pay

## 2024-09-03 DIAGNOSIS — F411 Generalized anxiety disorder: Secondary | ICD-10-CM

## 2024-09-03 DIAGNOSIS — F3181 Bipolar II disorder: Secondary | ICD-10-CM

## 2024-09-03 MED ORDER — HYDROXYZINE HCL 50 MG PO TABS
50.0000 mg | ORAL_TABLET | Freq: Four times a day (QID) | ORAL | 3 refills | Status: DC
  Filled 2024-09-03: qty 90, 23d supply, fill #0

## 2024-09-03 MED ORDER — BUPROPION HCL ER (XL) 150 MG PO TB24
150.0000 mg | ORAL_TABLET | ORAL | 3 refills | Status: DC
  Filled 2024-09-03: qty 30, 30d supply, fill #0

## 2024-09-03 MED ORDER — LAMOTRIGINE 100 MG PO TABS
100.0000 mg | ORAL_TABLET | Freq: Every day | ORAL | 3 refills | Status: DC
  Filled 2024-09-03: qty 30, 30d supply, fill #0

## 2024-09-03 MED ORDER — ARIPIPRAZOLE 10 MG PO TABS
10.0000 mg | ORAL_TABLET | Freq: Every day | ORAL | 3 refills | Status: DC
  Filled 2024-09-03: qty 30, 30d supply, fill #0

## 2024-09-06 ENCOUNTER — Other Ambulatory Visit (HOSPITAL_COMMUNITY): Payer: Self-pay

## 2024-09-08 ENCOUNTER — Other Ambulatory Visit (HOSPITAL_COMMUNITY): Payer: Self-pay

## 2024-09-10 ENCOUNTER — Other Ambulatory Visit (HOSPITAL_COMMUNITY): Payer: Self-pay

## 2024-09-10 ENCOUNTER — Other Ambulatory Visit: Payer: Self-pay

## 2024-09-20 ENCOUNTER — Other Ambulatory Visit (HOSPITAL_COMMUNITY): Payer: Self-pay

## 2024-09-24 ENCOUNTER — Encounter (HOSPITAL_COMMUNITY): Payer: Self-pay

## 2024-09-24 ENCOUNTER — Ambulatory Visit (INDEPENDENT_AMBULATORY_CARE_PROVIDER_SITE_OTHER): Admitting: Mental Health

## 2024-09-24 DIAGNOSIS — F3181 Bipolar II disorder: Secondary | ICD-10-CM | POA: Diagnosis not present

## 2024-09-24 DIAGNOSIS — F411 Generalized anxiety disorder: Secondary | ICD-10-CM

## 2024-09-24 NOTE — Progress Notes (Unsigned)
° °  THERAPIST PROGRESS NOTE Virtual Visit via Video Note  I connected with Janet Horn on 09/24/2024 at  9:00 AM EST by a video enabled telemedicine application and verified that I am speaking with the correct person using two identifiers.  Location: Patient: friend's home- car Provider: remote office   I discussed the limitations of evaluation and management by telemedicine and the availability of in person appointments. The patient expressed understanding and agreed to proceed.  I discussed the assessment and treatment plan with the patient. The patient was provided an opportunity to ask questions and all were answered. The patient agreed with the plan and demonstrated an understanding of the instructions.   The patient was advised to call back or seek an in-person evaluation if the symptoms worsen or if the condition fails to improve as anticipated.  I provided 34 minutes of non-face-to-face time during this encounter.   Ty Bernice Savant, [facility]   Session Time: 9:03 am ( 34 minutes)  Participation Level: Active  Behavioral Response: CasualAlertWNL  Type of Therapy: Individual Therapy  Treatment Goals addressed: TG: Janet Horn will increase management of moods AEB daily medication management with a ability to engage in self-care and coping skills daily within the next 90 days    STG: Janet will increase management in stress/anxiety AEB access to needed resources in community with ability to engage in effective decision making skills within the next 90 days.   ProgressTowards Goals: Progressing  Interventions: Supportive  Summary:   Janet Horn is a 23 y.o. female who presents with dx of bipolar II and PTSD. Janet presents for session alert and oriented; mood and affect low, dyphoric. Speech clear and coherent at normal rate and tone. Notes chief complaint financial stressors and reports to no longer be living with mother. Shares thought son  being in an new relationship . Notes continue to work and is exploring options for housing. Shares working t psychologist, forensic of stress with interaction with supports and thinking positive. Shares compliant with medications and denies suicidal thoughts although presence of suicidal thoughts last month. Shares increase in feelings of paranoia with plans to follow up with medication management provider. Shares to feel she is coping appropriately given situation. Denies safety. Ongoing work towards goals.   Suicidal/Homicidal: Nowithout intent/plan  Therapist Response:  Therapist engaged Janet in tele-therapy session. Completed check in and assessed for current level of functioning; sxs management and level of stressors. Supported Janet in processing current concerns and processing through financial stressors and working towards stability in community. Provided support and encouragement; validated feelings. Assessed for presence of supports and ability to cope with moods as needed. Assessed for safety and reviewed session.  Plan: Return again in  x 5 weeks.  Diagnosis: Bipolar 2 disorder, major depressive episode (HCC)  Generalized anxiety disorder  Collaboration of Care: Other None  Patient/Guardian was advised Release of Information must be obtained prior to any record release in order to collaborate their care with an outside provider. Patient/Guardian was advised if they have not already done so to contact the registration department to sign all necessary forms in order for us  to release information regarding their care.   Consent: Patient/Guardian gives verbal consent for treatment and assignment of benefits for services provided during this visit. Patient/Guardian expressed understanding and agreed to proceed.   Ty Bernice [place], [facility] 09/24/2024

## 2024-09-28 ENCOUNTER — Other Ambulatory Visit: Payer: Self-pay | Admitting: Family Medicine

## 2024-09-28 ENCOUNTER — Other Ambulatory Visit (HOSPITAL_COMMUNITY)
Admission: RE | Admit: 2024-09-28 | Discharge: 2024-09-28 | Disposition: A | Discharge status: Home / Self Care | Source: Ambulatory Visit | Attending: Family Medicine | Admitting: Family Medicine

## 2024-09-28 DIAGNOSIS — N898 Other specified noninflammatory disorders of vagina: Secondary | ICD-10-CM | POA: Diagnosis present

## 2024-09-29 ENCOUNTER — Ambulatory Visit: Payer: Self-pay | Admitting: Family Medicine

## 2024-09-29 ENCOUNTER — Other Ambulatory Visit: Payer: Self-pay

## 2024-09-29 ENCOUNTER — Other Ambulatory Visit (HOSPITAL_COMMUNITY): Payer: Self-pay

## 2024-09-29 LAB — CERVICOVAGINAL ANCILLARY ONLY
Bacterial Vaginitis (gardnerella): POSITIVE — AB
Candida Glabrata: NEGATIVE
Candida Vaginitis: POSITIVE — AB
Chlamydia: NEGATIVE
Comment: NEGATIVE
Comment: NEGATIVE
Comment: NEGATIVE
Comment: NEGATIVE
Comment: NEGATIVE
Comment: NORMAL
Neisseria Gonorrhea: NEGATIVE
Trichomonas: NEGATIVE

## 2024-09-29 MED ORDER — FLUCONAZOLE 150 MG PO TABS
150.0000 mg | ORAL_TABLET | Freq: Once | ORAL | 0 refills | Status: AC
  Filled 2024-09-29: qty 1, 1d supply, fill #0

## 2024-09-29 MED ORDER — METRONIDAZOLE 0.75 % VA GEL
1.0000 | Freq: Every day | VAGINAL | 0 refills | Status: AC
  Filled 2024-09-29: qty 70, 5d supply, fill #0

## 2024-09-30 ENCOUNTER — Other Ambulatory Visit (HOSPITAL_COMMUNITY): Payer: Self-pay

## 2024-10-20 ENCOUNTER — Other Ambulatory Visit (HOSPITAL_COMMUNITY): Payer: Self-pay

## 2024-10-20 MED ORDER — CEPHALEXIN 500 MG PO CAPS
500.0000 mg | ORAL_CAPSULE | Freq: Two times a day (BID) | ORAL | 0 refills | Status: AC
  Filled 2024-10-20: qty 14, 7d supply, fill #0

## 2024-10-21 ENCOUNTER — Other Ambulatory Visit (HOSPITAL_COMMUNITY): Payer: Self-pay

## 2024-10-26 ENCOUNTER — Telehealth (HOSPITAL_COMMUNITY): Payer: Self-pay | Admitting: Mental Health

## 2024-10-27 ENCOUNTER — Encounter (HOSPITAL_COMMUNITY): Payer: Self-pay

## 2024-10-27 ENCOUNTER — Telehealth (HOSPITAL_COMMUNITY): Admitting: Psychiatry

## 2024-10-27 ENCOUNTER — Ambulatory Visit (HOSPITAL_COMMUNITY)

## 2024-11-05 ENCOUNTER — Telehealth (INDEPENDENT_AMBULATORY_CARE_PROVIDER_SITE_OTHER): Admitting: Psychiatry

## 2024-11-05 ENCOUNTER — Encounter (HOSPITAL_COMMUNITY): Payer: Self-pay | Admitting: Psychiatry

## 2024-11-05 ENCOUNTER — Other Ambulatory Visit: Payer: Self-pay

## 2024-11-05 ENCOUNTER — Other Ambulatory Visit (HOSPITAL_COMMUNITY): Payer: Self-pay

## 2024-11-05 ENCOUNTER — Other Ambulatory Visit (HOSPITAL_BASED_OUTPATIENT_CLINIC_OR_DEPARTMENT_OTHER): Payer: Self-pay

## 2024-11-05 DIAGNOSIS — F250 Schizoaffective disorder, bipolar type: Secondary | ICD-10-CM | POA: Diagnosis not present

## 2024-11-05 DIAGNOSIS — F411 Generalized anxiety disorder: Secondary | ICD-10-CM | POA: Diagnosis not present

## 2024-11-05 MED ORDER — LAMOTRIGINE 100 MG PO TABS
100.0000 mg | ORAL_TABLET | Freq: Every day | ORAL | 3 refills | Status: AC
  Filled 2024-11-05: qty 30, 30d supply, fill #0

## 2024-11-05 MED ORDER — HYDROXYZINE HCL 50 MG PO TABS
50.0000 mg | ORAL_TABLET | Freq: Four times a day (QID) | ORAL | 3 refills | Status: AC
  Filled 2024-11-05: qty 90, 23d supply, fill #0

## 2024-11-05 MED ORDER — BUPROPION HCL ER (XL) 150 MG PO TB24
150.0000 mg | ORAL_TABLET | ORAL | 3 refills | Status: AC
  Filled 2024-11-05: qty 30, 30d supply, fill #0

## 2024-11-05 MED ORDER — ARIPIPRAZOLE 10 MG PO TABS
10.0000 mg | ORAL_TABLET | Freq: Every day | ORAL | 3 refills | Status: AC
  Filled 2024-11-05 (×2): qty 30, 30d supply, fill #0
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
## 2024-11-09 ENCOUNTER — Encounter (HOSPITAL_COMMUNITY): Payer: Self-pay

## 2024-11-09 ENCOUNTER — Ambulatory Visit (HOSPITAL_COMMUNITY)

## 2024-11-16 ENCOUNTER — Other Ambulatory Visit (HOSPITAL_COMMUNITY): Payer: Self-pay

## 2024-11-22 ENCOUNTER — Ambulatory Visit (HOSPITAL_COMMUNITY): Admitting: Mental Health

## 2024-12-01 ENCOUNTER — Ambulatory Visit (HOSPITAL_COMMUNITY)

## 2024-12-10 ENCOUNTER — Telehealth (HOSPITAL_COMMUNITY): Admitting: Psychiatry
